# Patient Record
Sex: Male | Born: 1965 | Race: White | Hispanic: No | Marital: Married | Smoking: Never smoker
Health system: Southern US, Community
[De-identification: ages and names within clinical notes are randomized; demographics above are authoritative.]

## PROBLEM LIST (undated history)

## (undated) DIAGNOSIS — I10 Essential (primary) hypertension: Secondary | ICD-10-CM

## (undated) DIAGNOSIS — G709 Myoneural disorder, unspecified: Secondary | ICD-10-CM

## (undated) DIAGNOSIS — M1990 Unspecified osteoarthritis, unspecified site: Secondary | ICD-10-CM

## (undated) DIAGNOSIS — R51 Headache: Secondary | ICD-10-CM

## (undated) DIAGNOSIS — F32A Depression, unspecified: Secondary | ICD-10-CM

## (undated) DIAGNOSIS — M51369 Other intervertebral disc degeneration, lumbar region without mention of lumbar back pain or lower extremity pain: Secondary | ICD-10-CM

## (undated) DIAGNOSIS — F419 Anxiety disorder, unspecified: Secondary | ICD-10-CM

## (undated) DIAGNOSIS — R519 Headache, unspecified: Secondary | ICD-10-CM

## (undated) DIAGNOSIS — K219 Gastro-esophageal reflux disease without esophagitis: Secondary | ICD-10-CM

## (undated) DIAGNOSIS — M542 Cervicalgia: Secondary | ICD-10-CM

## (undated) DIAGNOSIS — G5600 Carpal tunnel syndrome, unspecified upper limb: Secondary | ICD-10-CM

## (undated) DIAGNOSIS — I8390 Asymptomatic varicose veins of unspecified lower extremity: Secondary | ICD-10-CM

## (undated) DIAGNOSIS — M5136 Other intervertebral disc degeneration, lumbar region: Secondary | ICD-10-CM

## (undated) HISTORY — DX: Headache, unspecified: R51.9

## (undated) HISTORY — DX: Carpal tunnel syndrome, unspecified upper limb: G56.00

## (undated) HISTORY — PX: CLAVICLE SURGERY: SHX598

## (undated) HISTORY — DX: Anxiety disorder, unspecified: F41.9

## (undated) HISTORY — DX: Myoneural disorder, unspecified: G70.9

## (undated) HISTORY — DX: Essential (primary) hypertension: I10

## (undated) HISTORY — DX: Headache: R51

## (undated) HISTORY — DX: Asymptomatic varicose veins of unspecified lower extremity: I83.90

---

## 1990-02-17 HISTORY — PX: SHOULDER SURGERY: SHX246

## 2004-02-18 HISTORY — PX: BACK SURGERY: SHX140

## 2008-07-10 ENCOUNTER — Emergency Department: Payer: Self-pay | Admitting: Emergency Medicine

## 2008-10-25 ENCOUNTER — Emergency Department: Payer: Self-pay | Admitting: Emergency Medicine

## 2008-12-06 ENCOUNTER — Ambulatory Visit: Payer: Self-pay | Admitting: Pain Medicine

## 2008-12-28 ENCOUNTER — Ambulatory Visit: Payer: Self-pay | Admitting: Pain Medicine

## 2009-01-03 ENCOUNTER — Ambulatory Visit: Payer: Self-pay | Admitting: Pain Medicine

## 2009-01-16 ENCOUNTER — Ambulatory Visit: Payer: Self-pay | Admitting: Unknown Physician Specialty

## 2009-01-18 ENCOUNTER — Ambulatory Visit: Payer: Self-pay | Admitting: Unknown Physician Specialty

## 2009-02-01 ENCOUNTER — Ambulatory Visit: Payer: Self-pay | Admitting: Pain Medicine

## 2009-03-06 ENCOUNTER — Ambulatory Visit: Payer: Self-pay | Admitting: Pain Medicine

## 2009-03-12 ENCOUNTER — Ambulatory Visit: Payer: Self-pay | Admitting: Pain Medicine

## 2009-03-28 ENCOUNTER — Ambulatory Visit: Payer: Self-pay | Admitting: Unknown Physician Specialty

## 2009-04-12 ENCOUNTER — Ambulatory Visit: Payer: Self-pay | Admitting: Pain Medicine

## 2009-04-23 ENCOUNTER — Ambulatory Visit: Payer: Self-pay | Admitting: Pain Medicine

## 2009-05-30 ENCOUNTER — Emergency Department: Payer: Self-pay | Admitting: Emergency Medicine

## 2009-06-07 ENCOUNTER — Ambulatory Visit: Payer: Self-pay | Admitting: Pain Medicine

## 2009-06-28 ENCOUNTER — Ambulatory Visit: Payer: Self-pay | Admitting: Unknown Physician Specialty

## 2009-08-21 ENCOUNTER — Ambulatory Visit: Payer: Self-pay | Admitting: Pain Medicine

## 2009-08-29 ENCOUNTER — Ambulatory Visit: Payer: Self-pay | Admitting: Pain Medicine

## 2009-10-03 ENCOUNTER — Ambulatory Visit: Payer: Self-pay | Admitting: Pain Medicine

## 2009-10-12 ENCOUNTER — Emergency Department: Payer: Self-pay | Admitting: Emergency Medicine

## 2009-10-29 ENCOUNTER — Emergency Department: Payer: Self-pay | Admitting: Emergency Medicine

## 2009-11-06 ENCOUNTER — Ambulatory Visit: Payer: Self-pay | Admitting: Pain Medicine

## 2009-11-12 ENCOUNTER — Encounter: Admission: RE | Admit: 2009-11-12 | Discharge: 2009-11-12 | Payer: Self-pay | Admitting: Neurosurgery

## 2010-01-01 ENCOUNTER — Ambulatory Visit: Payer: Self-pay | Admitting: Pain Medicine

## 2010-01-03 ENCOUNTER — Emergency Department: Payer: Self-pay | Admitting: Emergency Medicine

## 2010-01-07 ENCOUNTER — Ambulatory Visit: Payer: Self-pay | Admitting: Pain Medicine

## 2010-01-16 ENCOUNTER — Ambulatory Visit: Payer: Self-pay | Admitting: Pain Medicine

## 2010-02-05 ENCOUNTER — Ambulatory Visit: Payer: Self-pay | Admitting: Pain Medicine

## 2010-02-13 ENCOUNTER — Ambulatory Visit: Payer: Self-pay | Admitting: Pain Medicine

## 2010-02-28 ENCOUNTER — Encounter
Admission: RE | Admit: 2010-02-28 | Discharge: 2010-02-28 | Payer: Self-pay | Source: Home / Self Care | Attending: Orthopedic Surgery | Admitting: Orthopedic Surgery

## 2010-03-07 ENCOUNTER — Ambulatory Visit: Payer: Self-pay | Admitting: Pain Medicine

## 2010-03-13 ENCOUNTER — Ambulatory Visit: Payer: Self-pay | Admitting: Pain Medicine

## 2010-04-04 ENCOUNTER — Ambulatory Visit: Payer: Self-pay | Admitting: Pain Medicine

## 2010-04-10 ENCOUNTER — Ambulatory Visit: Payer: Self-pay | Admitting: Pain Medicine

## 2010-04-20 ENCOUNTER — Ambulatory Visit: Payer: Self-pay | Admitting: Emergency Medicine

## 2010-05-02 ENCOUNTER — Ambulatory Visit: Payer: Self-pay | Admitting: Pain Medicine

## 2010-06-10 ENCOUNTER — Other Ambulatory Visit: Payer: Self-pay | Admitting: Orthopedic Surgery

## 2010-06-10 DIAGNOSIS — M542 Cervicalgia: Secondary | ICD-10-CM

## 2010-06-10 DIAGNOSIS — R200 Anesthesia of skin: Secondary | ICD-10-CM

## 2010-06-11 ENCOUNTER — Ambulatory Visit
Admission: RE | Admit: 2010-06-11 | Discharge: 2010-06-11 | Disposition: A | Discharge status: Home / Self Care | Payer: Medicaid Other | Source: Ambulatory Visit | Attending: Orthopedic Surgery | Admitting: Orthopedic Surgery

## 2010-06-11 DIAGNOSIS — R200 Anesthesia of skin: Secondary | ICD-10-CM

## 2010-06-11 DIAGNOSIS — M542 Cervicalgia: Secondary | ICD-10-CM

## 2010-10-17 ENCOUNTER — Ambulatory Visit: Payer: Self-pay | Admitting: Family Medicine

## 2010-10-17 ENCOUNTER — Ambulatory Visit: Payer: Self-pay | Admitting: Pain Medicine

## 2010-10-23 ENCOUNTER — Ambulatory Visit: Payer: Self-pay | Admitting: Pain Medicine

## 2010-11-19 ENCOUNTER — Ambulatory Visit: Payer: Self-pay | Admitting: Pain Medicine

## 2010-11-25 ENCOUNTER — Ambulatory Visit: Payer: Self-pay | Admitting: Pain Medicine

## 2010-12-02 ENCOUNTER — Ambulatory Visit: Payer: Self-pay | Admitting: Pain Medicine

## 2010-12-16 ENCOUNTER — Ambulatory Visit: Payer: Self-pay | Admitting: Pain Medicine

## 2010-12-31 ENCOUNTER — Ambulatory Visit: Payer: Self-pay | Admitting: Pain Medicine

## 2011-01-06 ENCOUNTER — Ambulatory Visit: Payer: Self-pay | Admitting: Pain Medicine

## 2011-01-30 ENCOUNTER — Ambulatory Visit: Payer: Self-pay | Admitting: Pain Medicine

## 2011-02-25 ENCOUNTER — Ambulatory Visit: Payer: Self-pay | Admitting: Pain Medicine

## 2011-04-02 ENCOUNTER — Ambulatory Visit: Payer: Self-pay | Admitting: Pain Medicine

## 2011-04-29 ENCOUNTER — Ambulatory Visit: Payer: Self-pay | Admitting: Pain Medicine

## 2011-05-28 ENCOUNTER — Ambulatory Visit: Payer: Self-pay | Admitting: Pain Medicine

## 2011-06-26 ENCOUNTER — Ambulatory Visit: Payer: Self-pay | Admitting: Pain Medicine

## 2011-07-29 ENCOUNTER — Ambulatory Visit: Payer: Self-pay | Admitting: Pain Medicine

## 2011-08-28 ENCOUNTER — Ambulatory Visit: Payer: Self-pay | Admitting: Pain Medicine

## 2011-09-25 ENCOUNTER — Ambulatory Visit: Payer: Self-pay | Admitting: Pain Medicine

## 2011-10-23 ENCOUNTER — Ambulatory Visit: Payer: Self-pay | Admitting: Pain Medicine

## 2011-11-25 ENCOUNTER — Ambulatory Visit: Payer: Self-pay | Admitting: Pain Medicine

## 2011-12-25 ENCOUNTER — Ambulatory Visit: Payer: Self-pay | Admitting: Pain Medicine

## 2012-01-22 ENCOUNTER — Ambulatory Visit: Payer: Self-pay | Admitting: Pain Medicine

## 2012-02-17 ENCOUNTER — Ambulatory Visit: Payer: Self-pay | Admitting: Pain Medicine

## 2012-03-23 ENCOUNTER — Ambulatory Visit: Payer: Self-pay | Admitting: Pain Medicine

## 2012-04-22 ENCOUNTER — Ambulatory Visit: Payer: Self-pay | Admitting: Pain Medicine

## 2012-05-18 ENCOUNTER — Ambulatory Visit: Payer: Self-pay | Admitting: Pain Medicine

## 2012-06-22 ENCOUNTER — Ambulatory Visit: Payer: Self-pay | Admitting: Pain Medicine

## 2012-07-22 ENCOUNTER — Ambulatory Visit: Payer: Self-pay | Admitting: Pain Medicine

## 2012-08-19 ENCOUNTER — Ambulatory Visit: Payer: Self-pay | Admitting: Pain Medicine

## 2012-09-14 ENCOUNTER — Ambulatory Visit: Payer: Self-pay | Admitting: Pain Medicine

## 2012-10-19 ENCOUNTER — Ambulatory Visit: Payer: Self-pay | Admitting: Pain Medicine

## 2012-11-11 ENCOUNTER — Ambulatory Visit: Payer: Self-pay | Admitting: Pain Medicine

## 2012-12-16 ENCOUNTER — Ambulatory Visit: Payer: Self-pay | Admitting: Pain Medicine

## 2013-01-17 ENCOUNTER — Ambulatory Visit: Payer: Self-pay | Admitting: Pain Medicine

## 2013-02-15 ENCOUNTER — Ambulatory Visit: Payer: Self-pay | Admitting: Pain Medicine

## 2013-03-17 ENCOUNTER — Ambulatory Visit: Payer: Self-pay | Admitting: Pain Medicine

## 2013-03-26 ENCOUNTER — Emergency Department: Payer: Self-pay | Admitting: Emergency Medicine

## 2013-04-12 ENCOUNTER — Ambulatory Visit: Payer: Self-pay | Admitting: Pain Medicine

## 2013-05-12 ENCOUNTER — Ambulatory Visit: Payer: Self-pay | Admitting: Pain Medicine

## 2013-06-15 ENCOUNTER — Ambulatory Visit: Payer: Self-pay | Admitting: Pain Medicine

## 2013-07-13 ENCOUNTER — Ambulatory Visit: Payer: Self-pay | Admitting: Pain Medicine

## 2013-08-11 ENCOUNTER — Ambulatory Visit: Payer: Self-pay | Admitting: Pain Medicine

## 2013-09-13 ENCOUNTER — Ambulatory Visit: Payer: Self-pay | Admitting: Pain Medicine

## 2013-10-12 ENCOUNTER — Ambulatory Visit: Payer: Self-pay | Admitting: Pain Medicine

## 2013-11-09 ENCOUNTER — Ambulatory Visit: Payer: Self-pay | Admitting: Pain Medicine

## 2013-12-08 ENCOUNTER — Ambulatory Visit: Payer: Self-pay | Admitting: Pain Medicine

## 2014-01-05 ENCOUNTER — Ambulatory Visit: Payer: Self-pay | Admitting: Pain Medicine

## 2014-02-02 ENCOUNTER — Ambulatory Visit: Payer: Self-pay | Admitting: Pain Medicine

## 2014-02-17 DIAGNOSIS — M47812 Spondylosis without myelopathy or radiculopathy, cervical region: Secondary | ICD-10-CM

## 2014-02-17 HISTORY — DX: Spondylosis without myelopathy or radiculopathy, cervical region: M47.812

## 2014-03-07 ENCOUNTER — Ambulatory Visit: Payer: Self-pay | Admitting: Pain Medicine

## 2014-04-06 ENCOUNTER — Ambulatory Visit: Payer: Self-pay | Admitting: Pain Medicine

## 2014-05-03 ENCOUNTER — Ambulatory Visit: Payer: Self-pay | Admitting: Pain Medicine

## 2014-06-05 ENCOUNTER — Ambulatory Visit
Admit: 2014-06-05 | Disposition: A | Discharge status: Home / Self Care | Payer: Self-pay | Attending: Pain Medicine | Admitting: Pain Medicine

## 2014-07-04 ENCOUNTER — Encounter (INDEPENDENT_AMBULATORY_CARE_PROVIDER_SITE_OTHER): Payer: Self-pay

## 2014-07-04 ENCOUNTER — Encounter: Payer: Self-pay | Admitting: Pain Medicine

## 2014-07-04 ENCOUNTER — Ambulatory Visit: Payer: Self-pay | Attending: Pain Medicine | Admitting: Pain Medicine

## 2014-07-04 VITALS — BP 135/94 | HR 74 | Temp 98.1°F | Resp 16 | Ht 73.0 in | Wt 212.0 lb

## 2014-07-04 DIAGNOSIS — M19041 Primary osteoarthritis, right hand: Secondary | ICD-10-CM | POA: Insufficient documentation

## 2014-07-04 DIAGNOSIS — M19042 Primary osteoarthritis, left hand: Secondary | ICD-10-CM | POA: Insufficient documentation

## 2014-07-04 DIAGNOSIS — M47816 Spondylosis without myelopathy or radiculopathy, lumbar region: Secondary | ICD-10-CM | POA: Insufficient documentation

## 2014-07-04 DIAGNOSIS — G5603 Carpal tunnel syndrome, bilateral upper limbs: Secondary | ICD-10-CM

## 2014-07-04 DIAGNOSIS — M47812 Spondylosis without myelopathy or radiculopathy, cervical region: Secondary | ICD-10-CM | POA: Insufficient documentation

## 2014-07-04 DIAGNOSIS — M5136 Other intervertebral disc degeneration, lumbar region: Secondary | ICD-10-CM | POA: Insufficient documentation

## 2014-07-04 DIAGNOSIS — M5030 Other cervical disc degeneration, unspecified cervical region: Secondary | ICD-10-CM

## 2014-07-04 DIAGNOSIS — M19012 Primary osteoarthritis, left shoulder: Secondary | ICD-10-CM | POA: Insufficient documentation

## 2014-07-04 DIAGNOSIS — M19011 Primary osteoarthritis, right shoulder: Secondary | ICD-10-CM | POA: Insufficient documentation

## 2014-07-04 DIAGNOSIS — M170 Bilateral primary osteoarthritis of knee: Secondary | ICD-10-CM | POA: Insufficient documentation

## 2014-07-04 MED ORDER — OXYCODONE HCL 10 MG PO TABS: ORAL_TABLET | ORAL | Status: DC

## 2014-07-04 MED ORDER — DICLOFENAC SODIUM 50 MG PO TBEC: DELAYED_RELEASE_TABLET | ORAL | Status: DC

## 2014-07-04 NOTE — Progress Notes (Signed)
Discharge patient home, ambulatory at 0851 hrs Teach back 3 done Script given oxycodone

## 2014-07-04 NOTE — Progress Notes (Signed)
   Subjective:    Patient ID: Gary Schultz, male    DOB: 07/31/1965, 49 y.o.   MRN: [id number]  HPI  Patient is a 49 year old gentleman returns to pain management for further evaluation and treatment of pain involving the neck and entire back upper and lower extremities. Pain is aggravated by standing walking twisting turning maneuvers. Patient with significant pain involving the hands especially the thumb of the right hand. We discussed patient's condition and patient is considering proceeding with evaluation of his hand by surgeon as we have previously discussed. We will continue medications as prescribed at this time and avoid interventional treatment. I'll understanding and in agreement with suggested treatment plan.  Review of Systems     Objective:   Physical Exam  Physical examination revealed tenderness to palpation of the splenius capitis muscles. There was tenderness over the cervical facet cervical paraspinal muscles as well as tenderness over the thoracic facet thoracic paraspinal musculature region with tenderness of the acromioclavicular and glenohumeral joint regions as well. Palpation of the thoracic region was noted tenderness to palpation without crepitus of the thoracic region noted with significant muscle spasm haven't been noted. There is decreased grip strength on the right compared to the left with severe tenderness to palpation the base of the right thumb without increased warmth crepitus are erythema noted. Palpation over the lumbar paraspinal musculature region lumbar facet region was tenderness to palpation with lateral bending and rotation reproducing pain of moderate degree with tenderness of the PSIS region and PIIS region straight leg raising was tolerates approximately 20 without definite increased pain with dorsiflexion and EHL strength appeared to be decreased. No definite sensory deficit of dermatomal distribution detected. Abdomen nontender no costovertebral  angle tenderness noted.    Assessment & Plan:  Assessment  Degenerative disc disease lumbar spine L2-3, L3-4, L4-L5, and L5-S1 degenerative changes with prior surgical intervention of the lumbar region.   Degenerative disc disease cervical spine For 5, C5-6 degenerative changes predominantly  Degenerative joint disease Hands, shoulders, knees    Plan  Continue present medications. Voltaren oxycodone  F/U PCP for evaliation of  BP and general medical  condition.  F/U surgical evaluation. Patient is considering undergoing orthopedic evaluation of hands  F/U neurological evaluation.  May consider radiofrequency rhizolysis or intraspinal procedures pending response to present treatment and F/U evaluation.  Patient to call [facility] Management Center should patient have concerns prior to scheduled return appointment.   Degenerative changes

## 2014-07-04 NOTE — Progress Notes (Signed)
   Subjective:    Patient ID: Gary Schultz, male    DOB: 09/17/1965, 49 y.o.   MRN: [id number]  HPI    Review of Systems     Objective:   Physical Exam        Assessment & Plan:

## 2014-07-04 NOTE — Patient Instructions (Signed)
Continue present medications. Voltaren and oxycodone.  F/U PCP for evaliation of  BP and general medical  condition.  F/U surgical evaluation.. Surgical evaluation of hand to be considered as discussed  F/U nrurological evaluation.  May consider radiofrequency rhizolysis or intraspinal procedures pending response to present treatment and F/U evaluation.  Patient to call [facility] Management Center should patient have concerns prior to scheduled return appointment.

## 2014-08-02 ENCOUNTER — Encounter: Payer: Self-pay | Admitting: Pain Medicine

## 2014-08-02 ENCOUNTER — Ambulatory Visit: Payer: Self-pay | Attending: Pain Medicine | Admitting: Pain Medicine

## 2014-08-02 VITALS — BP 118/83 | HR 68 | Temp 97.8°F | Resp 16 | Ht 73.0 in | Wt 208.0 lb

## 2014-08-02 DIAGNOSIS — G5600 Carpal tunnel syndrome, unspecified upper limb: Secondary | ICD-10-CM | POA: Insufficient documentation

## 2014-08-02 DIAGNOSIS — M5481 Occipital neuralgia: Secondary | ICD-10-CM | POA: Insufficient documentation

## 2014-08-02 DIAGNOSIS — M47816 Spondylosis without myelopathy or radiculopathy, lumbar region: Secondary | ICD-10-CM | POA: Insufficient documentation

## 2014-08-02 DIAGNOSIS — M47812 Spondylosis without myelopathy or radiculopathy, cervical region: Secondary | ICD-10-CM | POA: Insufficient documentation

## 2014-08-02 DIAGNOSIS — Z9889 Other specified postprocedural states: Secondary | ICD-10-CM | POA: Insufficient documentation

## 2014-08-02 DIAGNOSIS — M5030 Other cervical disc degeneration, unspecified cervical region: Secondary | ICD-10-CM | POA: Insufficient documentation

## 2014-08-02 DIAGNOSIS — M533 Sacrococcygeal disorders, not elsewhere classified: Secondary | ICD-10-CM | POA: Insufficient documentation

## 2014-08-02 DIAGNOSIS — M5136 Other intervertebral disc degeneration, lumbar region: Secondary | ICD-10-CM | POA: Insufficient documentation

## 2014-08-02 DIAGNOSIS — R51 Headache: Secondary | ICD-10-CM | POA: Insufficient documentation

## 2014-08-02 MED ORDER — OXYCODONE HCL 10 MG PO TABS: ORAL_TABLET | ORAL | Status: DC

## 2014-08-02 MED ORDER — DICLOFENAC SODIUM 50 MG PO TBEC: DELAYED_RELEASE_TABLET | ORAL | Status: DC

## 2014-08-02 NOTE — Progress Notes (Signed)
Safety precautions to be maintained throughout the outpatient stay will include: orient to surroundings, keep bed in low position, maintain call bell within reach at all times, provide assistance with transfer out of bed and ambulation.  Discharged via ambulatory at 8:15 am.

## 2014-08-02 NOTE — Patient Instructions (Addendum)
Continue present medications. Voltaren and oxycodone  F/U PCP for evaliation of  BP and general medical  condition.  F/U surgical evaluation.. As discussed suggested that you have your carpal tunnel evaluated and your arthritic knee  F/U neurological evaluation.  May consider radiofrequency rhizolysis or intraspinal procedures pending response to present treatment and F/U evaluation.  Patient to call [facility] Management Center should patient have concerns prior to scheduled return appointment.   A prescription for VOLTARIN was sent to your pharmacy and should be available for pickup today. A prescription for OXYCODONE was given to you today.

## 2014-08-02 NOTE — Progress Notes (Signed)
   Subjective:    Patient ID: Gary Schultz, male    DOB: 1965/02/23, 49 y.o.   MRN: [id number]  HPI  Patient is 49 year old gentleman returns to [facility] for further evaluation and treatment of pain involving the neck upper extremity regions especially the right upper extremity with severe carpal tunnel syndrome pain. Patient is with pain involving the mid and lower back regions as well aggravated by standing and walking. Patient states that his occupation aggravates his condition severely. 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 We have discussed patient's condition and due to insurance purposes patient is unable to undergo interventional treatment. We will continue present medications as prescribed and patient will consider further treatment pending follow-up evaluation. We will also recommend orthopedic evaluation which patient prefers to avoid due to expenses as well. We advised patient follow-up with primary care physician regarding elevated blood pressure and will continue medications as prescribed.       Review of Systems     Objective:   Physical Exam  There was tends O screens And occipitalis musculature regions palpation was reproduced mild to moderate discomfort. There appear to be unremarkable Spurling's maneuver. Palpation over the cervical facet and thoracic facet paraspinal musculature region were sent to palpation of moderate degree. Patient was with severely decreased grip strength on the right compared to the left with severe increase of pain with Tinel's and Phalen's maneuver. There was tenderness to palpation over the lumbar paraspinal musculature region lumbar facet region palpation of which reproduced pain of moderate to moderately severe degree. Lateral bending rotation extension and palpation of the lumbar facets reproduced severe pain. Straight leg  raising was tolerates approximately 20 without increase of pain with dorsiflexion noted. EHL strength appeared to be decreased with no definite sensory deficit of dermatomal distribution detected. There was negative clonus negative Homans. Mild tenderness of greater trochanteric region iliotibial band region was noted.  Abdomen nontender no costovertebral tenderness noted.    Assessment & Plan:  Assessment degenerative disc disease lumbar spine  L2-3, L3-4, L4-L5, and L5-S1 degenerative changes predominantly Status post lumbar surgery  Lumbar facet syndrome  Sacroiliac joint dysfunction  Degenerative disc disease cervical spine C4-5, C5-6 degenerative changes noted predominantly  Cervicogenic headaches  Bilateral greater occipital neuralgia  Carpal tunnel syndrome    Plan  Continue present medications. Oxycodone and Voltaren  F/U PCP for evaliation of  BP and general medical  condition. Patient follow-up with primary care physician for evaluation of blood pressure as discussed  F/U surgical evaluation. Surgical evaluation of carpal tunnel syndrome as discussed. Patient prefers to avoid due to expense and lack of insurance at this time  Neurosurgical evaluation of pain of the cervical and upper extremity regions and lumbar and lower extremity regions. We have recommended patient undergo neurosurgical evaluation which patient prefers to avoid due to lack of insurance and expense of the evaluation at this time  F/U neurological evaluation.  May consider radiofrequency rhizolysis or intraspinal procedures pending response to present treatment and F/U evaluation.  Patient to call [facility] Management Center should patient have concerns prior to scheduled return appointment.

## 2014-08-31 ENCOUNTER — Ambulatory Visit: Payer: Self-pay | Attending: Pain Medicine | Admitting: Pain Medicine

## 2014-08-31 ENCOUNTER — Encounter: Payer: Self-pay | Admitting: Pain Medicine

## 2014-08-31 VITALS — BP 150/87 | HR 74 | Temp 96.8°F | Resp 18 | Ht 73.0 in | Wt 210.0 lb

## 2014-08-31 DIAGNOSIS — M5136 Other intervertebral disc degeneration, lumbar region: Secondary | ICD-10-CM | POA: Insufficient documentation

## 2014-08-31 DIAGNOSIS — M47816 Spondylosis without myelopathy or radiculopathy, lumbar region: Secondary | ICD-10-CM | POA: Insufficient documentation

## 2014-08-31 DIAGNOSIS — G5600 Carpal tunnel syndrome, unspecified upper limb: Secondary | ICD-10-CM | POA: Insufficient documentation

## 2014-08-31 DIAGNOSIS — M47812 Spondylosis without myelopathy or radiculopathy, cervical region: Secondary | ICD-10-CM | POA: Insufficient documentation

## 2014-08-31 DIAGNOSIS — Z9889 Other specified postprocedural states: Secondary | ICD-10-CM | POA: Insufficient documentation

## 2014-08-31 MED ORDER — OXYCODONE HCL 10 MG PO TABS
ORAL_TABLET | ORAL | Status: DC
Start: 2014-08-31 — End: 2014-10-03

## 2014-08-31 MED ORDER — DICLOFENAC SODIUM 50 MG PO TBEC: DELAYED_RELEASE_TABLET | ORAL | Status: DC

## 2014-08-31 NOTE — Progress Notes (Signed)
Safety precautions to be maintained throughout the outpatient stay will include: orient to surroundings, keep bed in low position, maintain call bell within reach at all times, provide assistance with transfer out of bed and ambulation.  

## 2014-08-31 NOTE — Patient Instructions (Signed)
Continue present medications Voltaren and oxycodone  F/U PCP for evaliation of  BP and general medical  condition.  F/U surgical evaluation  F/U neurological evaluation  May consider radiofrequency rhizolysis or intraspinal procedures pending response to present treatment and F/U evaluation.  Patient to call [facility] Center should patient have concerns prior to scheduled return appointment.

## 2014-08-31 NOTE — Progress Notes (Signed)
   Subjective:    Patient ID: Gary Schultz, male    DOB: 03/27/1965, 49 y.o.   MRN: [id number]  HPI Patient is 49 year old gentleman returns a [facility] for further evaluation and treatment of pain involving the region of the neck upper extremity regions upper mid and lower back and lower extremity regions. Patient states that he has significant pain involving the region of the hands. 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 We discussed surgical evaluation of patient's condition. We will avoid interventional treatment as discussed with patient. Patient without trauma change in events of daily living the call significant change in symptomatology. We we will continue presently prescribed medications Remain available to modified treatment regimen as discussed. The patient was understanding and agree with suggested treatment plan.     Review of Systems     Objective:   Physical Exam  There was mild to moderate tenderness of the splenius capitis and occipitalis region. Palpation over the cervical facet cervical paraspinal musculature region reproduced moderate discomfort as well. There appeared to be unremarkable Spurling's maneuver. Patient was with decreased grip strength especially on the right. Tinel and Phalen's maneuver increased pain of moderate to moderately severe degree on the right. An moderate degree on the left. There was no definite sensory deficit of dermatomal distribution detected. Palpation over the thoracic facet thoracic paraspinal musculature region was a tenderness to palpation with tenderness over the upper mid and lower thoracic paraspinal musculature regions. Patient over the lumbar paraspinal muscles lumbar facet region was with tinged palpation of moderate degree. Extension and palpation of the lumbar facets reproduce moderate discomfort. There was tenderness over the PSIS and PII S region of moderate degree. Straight  leg raising limited proximal 20 without increased pain with dorsiflexion noted. There was negative clonus negative Homans. Abdomen nontender with no costovertebral angle tenderness noted.    Assessment & Plan:  Degenerative disc disease lumbar spine Multilevel lumbar degenerative changes Status post lumbar surgery  Lumbar facet syndrome  Degenerative changes cervical spine C5 and C5-6 degenerative changes especially  Cervical facet syndrome  Carpal tunnel syndrome    Plan   Continue present medications Voltaren and oxycodone  F/U PCP A Krebs  for evaliation of  BP and general medical  condition.  F/U surgical evaluation. We discussed patient undergoing surgical evaluation of his upper extremity pain with there being concern regarding significant carpal tunnel syndrome. Patient will consider surgical evaluation as discussed. We also discussed patient undergoing neurosurgical evaluation of pain of the cervical region and upper extremity regions  F/U neurological evaluation.  May consider radiofrequency rhizolysis or intraspinal procedures pending response to present treatment and F/U evaluation.  Patient to call [facility] Center should patient have concerns prior to scheduled return appointment.

## 2014-09-14 ENCOUNTER — Other Ambulatory Visit: Payer: Self-pay | Admitting: Pain Medicine

## 2014-10-03 ENCOUNTER — Encounter: Payer: Self-pay | Admitting: Pain Medicine

## 2014-10-03 ENCOUNTER — Ambulatory Visit: Payer: Self-pay | Attending: Pain Medicine | Admitting: Pain Medicine

## 2014-10-03 VITALS — BP 141/82 | HR 72 | Temp 96.5°F | Resp 18 | Ht 73.0 in | Wt 210.0 lb

## 2014-10-03 DIAGNOSIS — M47816 Spondylosis without myelopathy or radiculopathy, lumbar region: Secondary | ICD-10-CM | POA: Insufficient documentation

## 2014-10-03 DIAGNOSIS — M5030 Other cervical disc degeneration, unspecified cervical region: Secondary | ICD-10-CM | POA: Insufficient documentation

## 2014-10-03 DIAGNOSIS — M47812 Spondylosis without myelopathy or radiculopathy, cervical region: Secondary | ICD-10-CM | POA: Insufficient documentation

## 2014-10-03 DIAGNOSIS — M5136 Other intervertebral disc degeneration, lumbar region: Secondary | ICD-10-CM | POA: Insufficient documentation

## 2014-10-03 DIAGNOSIS — Z9889 Other specified postprocedural states: Secondary | ICD-10-CM | POA: Insufficient documentation

## 2014-10-03 DIAGNOSIS — G5600 Carpal tunnel syndrome, unspecified upper limb: Secondary | ICD-10-CM | POA: Insufficient documentation

## 2014-10-03 MED ORDER — DICLOFENAC SODIUM 50 MG PO TBEC: DELAYED_RELEASE_TABLET | ORAL | Status: DC

## 2014-10-03 MED ORDER — OXYCODONE HCL 10 MG PO TABS: ORAL_TABLET | ORAL | Status: DC

## 2014-10-03 NOTE — Progress Notes (Signed)
   Subjective:    Patient ID: Gary Schultz, male    DOB: 19-May-1965, 49 y.o.   MRN: [id number]  HPI  Patient is 49 year old gentleman returns to [facility] for further evaluation and treatment of pain involving the neck and entire back upper and lower extremity regions. Patient tolerating medications well at this time. We discussed additional treatment including interventional treatment. At the present time patient is waiting for insurance coverage prior to having procedures. We will consider patient for modification of medications pending follow-up evaluation as discussed. 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 There appeared to be unremarkable Spurling's maneuver. Patient was with decreased grip strength. Tinel and Phalen's maneuver were associated with increased pain. There was tenderness over the cervical facet cervical paraspinal musculature region as well as the thoracic facet thoracic paraspinal musculatures in a moderate degree no crepitus of the thoracic region was noted. There was tenderness of the acromioclavicular and glenohumeral joint region of moderate degree as well of the lumbar paraspinal musculature region lumbar facet region associated with moderate moderately severe discomfort. Lateral bending and rotation and extension to palpation of the lumbar facets reproduce moderate to moderately severe discomfort. Straight leg raising was tolerated to approximately 30 without a definite increase of pain with dorsiflexion noted. There was negative  clonus negative Homans there was mild tinnitus of the greater trochanteric region and iliotibial band region. Abdomen was nontender and no costovertebral tenderness was noted.      Assessment & Plan:    Degenerative disc disease lumbar spine Multilevel lumbar degenerative changes Status post lumbar surgery  Lumbar facet syndrome  Degenerative changes cervical spine C5 and C5-6 degenerative changes especially  Cervical facet syndrome  Carpal tunnel syndrome    Plan  Continue present medications Voltaren and oxycodone  F/U PCP Dr. Laroy Apple for evaliation of  BP and general medical  condition and to discuss rheumatological evaluation  F/U surgical evaluation as discussed  F/U neurological evaluation  May consider radiofrequency rhizolysis or intraspinal procedures pending response to present treatment and F/U evaluation   Patient to call [facility] should patient have concerns prior to scheduled return appointmen.

## 2014-10-03 NOTE — Patient Instructions (Addendum)
Continue present medications Voltaren and oxycodone   F/U PCP Dr.Krebs  for evaliation of  BP and general medical  condition and to discuss rheumatological evaluation  F/U surgical evaluation as discussed  F/U neurological evaluation  May consider radiofrequency rhizolysis or intraspinal procedures pending response to present treatment and F/U evaluation   Patient to call [facility] Center should patient have concerns prior to scheduled return appointmen.

## 2014-10-03 NOTE — Progress Notes (Signed)
Safety precautions to be maintained throughout the outpatient stay will include: orient to surroundings, keep bed in low position, maintain call bell within reach at all times, provide assistance with transfer out of bed and ambulation.  

## 2014-11-02 ENCOUNTER — Ambulatory Visit: Payer: Self-pay | Attending: Pain Medicine | Admitting: Pain Medicine

## 2014-11-02 ENCOUNTER — Encounter: Payer: Self-pay | Admitting: Pain Medicine

## 2014-11-02 VITALS — BP 136/93 | HR 65 | Temp 98.0°F | Resp 18 | Ht 73.0 in | Wt 210.0 lb

## 2014-11-02 DIAGNOSIS — M5030 Other cervical disc degeneration, unspecified cervical region: Secondary | ICD-10-CM

## 2014-11-02 DIAGNOSIS — M47812 Spondylosis without myelopathy or radiculopathy, cervical region: Secondary | ICD-10-CM | POA: Insufficient documentation

## 2014-11-02 DIAGNOSIS — Z9889 Other specified postprocedural states: Secondary | ICD-10-CM | POA: Insufficient documentation

## 2014-11-02 DIAGNOSIS — M5136 Other intervertebral disc degeneration, lumbar region: Secondary | ICD-10-CM | POA: Insufficient documentation

## 2014-11-02 DIAGNOSIS — G5600 Carpal tunnel syndrome, unspecified upper limb: Secondary | ICD-10-CM | POA: Insufficient documentation

## 2014-11-02 DIAGNOSIS — G5603 Carpal tunnel syndrome, bilateral upper limbs: Secondary | ICD-10-CM

## 2014-11-02 DIAGNOSIS — M47816 Spondylosis without myelopathy or radiculopathy, lumbar region: Secondary | ICD-10-CM | POA: Insufficient documentation

## 2014-11-02 MED ORDER — OXYCODONE HCL 10 MG PO TABS: ORAL_TABLET | ORAL | Status: DC

## 2014-11-02 MED ORDER — DICLOFENAC SODIUM 50 MG PO TBEC: DELAYED_RELEASE_TABLET | ORAL | Status: DC

## 2014-11-02 NOTE — Progress Notes (Signed)
Discharged to home ambulatory with script in hand for oxycodone.  Return in 1 month.  Voltaren at pharmacy for pick up.

## 2014-11-02 NOTE — Progress Notes (Signed)
Safety precautions to be maintained throughout the outpatient stay will include: orient to surroundings, keep bed in low position, maintain call bell within reach at all times, provide assistance with transfer out of bed and ambulation.  

## 2014-11-02 NOTE — Progress Notes (Signed)
   Subjective:    Patient ID: Gary Schultz, male    DOB: 1965/07/09, 49 y.o.   MRN: [id number]  HPI   Patient 49 year old gentleman returns to pain management for further evaluation and treatment of pain involving the neck upper extremities lower back and lower extremity regions. Patient has significant pain involving the hands which is aggravated by activities on the job. 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 Patient with pain involving the neck as well which also is due to patient's on the job activities. Patient with lower back lower extremity pain in addition to previously mentioned conditions. Discussed patient's condition will continue present medication. Patient will be considered for interventional treatment once patient obtains insurance. At the present time we recommended patient undergo surgical evaluation of pain of the hands cervical upper extremity regions lumbar and lower extremity regions.. We will continue present medications Voltaren and oxycodone as prescribed. The patient agrees with suggested plan.     Review of Systems     Objective:   Physical Exam  There was tenderness of the splenius capitis and occipitalis regions of moderate degree. Patient with limited range of motion of the cervical spine. There was tenderness of the trapezius levator scapula rhomboid musculature regions of moderate degree. Palpation of the cervical paraspinal musculature region and thoracic paraspinal musculature region reproduced pain of moderate degree. No crepitus of the thoracic region was noted. Patient with decreased grip strength. Tinel and Phalen maneuver with moderate increase of pain. There was tends to palpation at the base of thumbs with nodule formation noted as well. There was no increased warmth or erythema noted. Patient was with decreased grip strength. There was tenderness over the lumbar paraspinal musculature and lumbar facet region with lateral bending and rotation and  extension and palpation of the lumbar facets reproducing moderate discomfort. Straight leg raising limited to approximately 30 without definite increased pain with dorsiflexion noted. There appeared to be negative clonus negative Homans. Tenderness and no costovertebral angle tenderness noted.          Assessment & Plan:   Degenerative disc disease lumbar spine Multilevel lumbar degenerative changes Status post lumbar surgery  Lumbar facet syndrome  Degenerative changes cervical spine C5 and C5-6 degenerative changes especially  Cervical facet syndrome  Carpal tunnel syndrome    PLAN   Continue present medication Voltaren and oxycodone  F/U PCP Dr.Krebs  for evaliation of  BP and general medical  condition  F/U surgical evaluation . Patient undergo surgical evaluation of hands cervical lumbar and extremity regions as discussed,   F/U neurological evaluation. May consider pending follow-up evaluations  Continue use of TENS unit and avoid aggravation of symptoms  May consider radiofrequency rhizolysis or intraspinal procedures pending response to present treatment and F/U evaluation   Patient to call [facility] Center should patient have concerns prior to scheduled return appointment.

## 2014-11-02 NOTE — Patient Instructions (Addendum)
PLAN   Continue present medication Voltaren and oxycodone  F/U PCP Dr. Laroy Apple for evaliation of  BP and general medical  condition  F/U surgical evaluation. May consider pending follow-up evaluations May see hand surgeon to discuss surgery as we discussed. Continue to wear wrist splints at night for pain of the upper extremities  Continue to use TENS unit and avoid aggravation of symptoms  F/U neurological evaluation. May consider pending follow-up evaluations  May consider radiofrequency rhizolysis or intraspinal procedures pending response to present treatment and F/U evaluation   Patient to call [facility] Center should patient have concerns prior to scheduled return appointment.

## 2014-11-05 ENCOUNTER — Other Ambulatory Visit: Payer: Self-pay | Admitting: Family Medicine

## 2014-11-15 ENCOUNTER — Other Ambulatory Visit: Payer: Self-pay | Admitting: Family Medicine

## 2014-11-15 NOTE — Telephone Encounter (Signed)
What labs do you need ordered?  Can I call in this Rx or should we wait till OV?

## 2014-11-15 NOTE — Telephone Encounter (Signed)
Pt called need  Refill on  Lisinopril 30 mg  Call in to [facility] in [place] / Pt also need  Lab order for Labs for Thursday. Pt call back  # is  [phone]

## 2014-11-15 NOTE — Telephone Encounter (Signed)
I would hold the Lisinopril refill unless he will be totally out.  It may need to be changed.  Also, insurance likes for all labs to to linked to a dx., and that is done better at office visit.  Results can be discussed after visit.-jh

## 2014-11-20 NOTE — Telephone Encounter (Signed)
R/t call to patient to find out if he had been contacted. Also to let him know what Dr. Juanetta Gosling suggested.

## 2014-11-22 NOTE — Telephone Encounter (Signed)
Left message

## 2014-11-24 NOTE — Telephone Encounter (Signed)
Sending letter can not reach by phone.[facility]

## 2014-11-28 ENCOUNTER — Encounter: Payer: Self-pay | Admitting: Pain Medicine

## 2014-11-28 ENCOUNTER — Ambulatory Visit: Payer: Self-pay | Attending: Pain Medicine | Admitting: Pain Medicine

## 2014-11-28 VITALS — BP 140/95 | HR 67 | Temp 96.2°F | Resp 18 | Ht 73.0 in | Wt 215.0 lb

## 2014-11-28 DIAGNOSIS — Z9889 Other specified postprocedural states: Secondary | ICD-10-CM | POA: Insufficient documentation

## 2014-11-28 DIAGNOSIS — M47816 Spondylosis without myelopathy or radiculopathy, lumbar region: Secondary | ICD-10-CM | POA: Insufficient documentation

## 2014-11-28 DIAGNOSIS — M5136 Other intervertebral disc degeneration, lumbar region: Secondary | ICD-10-CM | POA: Insufficient documentation

## 2014-11-28 DIAGNOSIS — M5030 Other cervical disc degeneration, unspecified cervical region: Secondary | ICD-10-CM

## 2014-11-28 DIAGNOSIS — G5603 Carpal tunnel syndrome, bilateral upper limbs: Secondary | ICD-10-CM

## 2014-11-28 DIAGNOSIS — M47814 Spondylosis without myelopathy or radiculopathy, thoracic region: Secondary | ICD-10-CM | POA: Insufficient documentation

## 2014-11-28 DIAGNOSIS — M47812 Spondylosis without myelopathy or radiculopathy, cervical region: Secondary | ICD-10-CM

## 2014-11-28 DIAGNOSIS — G5600 Carpal tunnel syndrome, unspecified upper limb: Secondary | ICD-10-CM | POA: Insufficient documentation

## 2014-11-28 MED ORDER — OXYCODONE HCL 10 MG PO TABS: ORAL_TABLET | ORAL | Status: DC

## 2014-11-28 NOTE — Progress Notes (Signed)
Safety precautions to be maintained throughout the outpatient stay will include: orient to surroundings, keep bed in low position, maintain call bell within reach at all times, provide assistance with transfer out of bed and ambulation.  

## 2014-11-28 NOTE — Patient Instructions (Addendum)
PLAN   Continue present medication diclofenac and oxycodone  F/U PCP Dr. Laroy Apple for evaliation of  BP and general medical  condition  F/U surgical evaluation as discussed  F/U neurological evaluation. May consider pending follow-up evaluations  May consider radiofrequency rhizolysis or intraspinal procedures pending response to present treatment and F/U evaluation   Patient to call Pain Management Center should patient have concerns prior to scheduled return appointment. Pain Management Discharge Instructions  General Discharge Instructions :  If you need to reach your doctor call: Monday-Friday 8:00 am - 4:00 pm at [phone] or toll free [phone].  After clinic hours [phone] to have operator reach doctor.  Bring all of your medication bottles to all your appointments in the pain clinic.  To cancel or reschedule your appointment with Pain Management please remember to call 24 hours in advance to avoid a fee.  Refer to the educational materials which you have been given on: General Risks, I had my Procedure. Discharge Instructions, Post Sedation.  Post Procedure Instructions:  The drugs you were given will stay in your system until tomorrow, so for the next 24 hours you should not drive, make any legal decisions or drink any alcoholic beverages.  You may eat anything you prefer, but it is better to start with liquids then soups and crackers, and gradually work up to solid foods.  Please notify your doctor immediately if you have any unusual bleeding, trouble breathing or pain that is not related to your normal pain.  Depending on the type of procedure that was done, some parts of your body may feel week and/or numb.  This usually clears up by tonight or the next day.  Walk with the use of an assistive device or accompanied by an adult for the 24 hours.  You may use ice on the affected area for the first 24 hours.  Put ice in a Ziploc bag and cover with a towel and  place against area 15 minutes on 15 minutes off.  You may switch to heat after 24 hours.

## 2014-11-28 NOTE — Progress Notes (Signed)
   Subjective:    Patient ID: Gary Schultz, male    DOB: 06-25-1965, 49 y.o.   MRN: [id number]  HPI Patient 49 year old gentleman [facility] evaluation and treatment of pain involving the neck upper extremity regions and with severe tenderness to palpation base of the thumb of the right hand. Patient states that his carpal tunnel symptoms appeared to be exacerbated at this time as well patient is mechanics and misuses hands for working. Patient states that lower back lower extremity pain increases as the day progresses as well. We will continue presently prescribed medications and remain available we'll proceed with interventional treatment. At the present time patient is attempting to obtain insurance. We will continue nonsegmental treatment medications at this time. The patient denies any trauma change in events of daily living the cause change in symptomatology. We will continue presently prescribed medications. Patient to call pain management should be change in condition prior to scheduled return appointment all understanding and in agreement with suggested treatment plan.   Review of Systems     Objective:   Physical Exam There was tennis of the splenius capitis and occipitalis musculature region of moderate degree with limited range of motion of the cervical spine. There was tenderness of the cervical facet cervical paraspinal musculature region of moderate degree. Palpation over the thoracic facet thoracic paraspinal musculature region was with moderate tends to palpation as well Tinel and Phalen's maneuver associated with moderately severe discomfort. There was tenderness to palpation in the base of the thumb of the right hand without increased warmth or erythema noted. There was decreased grip strength on the right compared to left. Tinel and Phalen's maneuver associated with moderately severe increased pain. Palpation over the thoracic facet thoracic  paraspinal musculature region associated increased pain of moderate degree with no crepitus of the thoracic region noted.  Palpation over the lumbar paraspinal muscle lumbar facet region associated with moderately severe discomfort with lateral bending rotation and extension palpation of the lumbar facets reproducing moderately severe discomfort. Straight leg raising tolerates approximately 20 without increased pain with dorsiflexion noted. DTRs appear to be trace at the knees. There was negative clonus negative Homans. No definite sensory deficit of dermatomal distribution was detected. Lateral bending and rotation and extension palpation of the lumbar facets reproduce moderately severe discomfort. There was negative clonus negative Homans. Abdomen nontender with no costovertebral angle tenderness noted.       Assessment & Plan:    Degenerative disc disease lumbar spine Multilevel lumbar degenerative changes Status post lumbar surgery  Lumbar facet syndrome  Degenerative changes cervical spine C5 and C5-6 degenerative changes especially  Cervical facet syndrome  Carpal tunnel syndrome     PLAN   Continue present medication diclofenac and oxycodone  F/U PCP Dr. Laroy Apple for evaliation of  BP and general medical  condition  F/U surgical evaluation as discussed  F/U neurological evaluation. May consider pending follow-up evaluations  May consider radiofrequency rhizolysis or intraspinal procedures pending response to present treatment and F/U evaluation  At the present time patient is awaiting to obtain insurance Patient to call [facility] should patient have concerns prior to scheduled return appointment.

## 2014-11-30 ENCOUNTER — Encounter: Payer: Self-pay | Admitting: Pain Medicine

## 2014-12-19 ENCOUNTER — Other Ambulatory Visit: Payer: Self-pay | Admitting: Pain Medicine

## 2014-12-28 ENCOUNTER — Ambulatory Visit: Payer: Self-pay | Attending: Pain Medicine | Admitting: Pain Medicine

## 2014-12-28 ENCOUNTER — Encounter: Payer: Self-pay | Admitting: Pain Medicine

## 2014-12-28 VITALS — BP 149/89 | HR 77 | Temp 98.0°F | Resp 18 | Ht 73.0 in | Wt 215.0 lb

## 2014-12-28 DIAGNOSIS — M19041 Primary osteoarthritis, right hand: Secondary | ICD-10-CM | POA: Insufficient documentation

## 2014-12-28 DIAGNOSIS — G5600 Carpal tunnel syndrome, unspecified upper limb: Secondary | ICD-10-CM | POA: Insufficient documentation

## 2014-12-28 DIAGNOSIS — G5603 Carpal tunnel syndrome, bilateral upper limbs: Secondary | ICD-10-CM

## 2014-12-28 DIAGNOSIS — M5136 Other intervertebral disc degeneration, lumbar region: Secondary | ICD-10-CM | POA: Insufficient documentation

## 2014-12-28 DIAGNOSIS — M47812 Spondylosis without myelopathy or radiculopathy, cervical region: Secondary | ICD-10-CM | POA: Insufficient documentation

## 2014-12-28 DIAGNOSIS — M47816 Spondylosis without myelopathy or radiculopathy, lumbar region: Secondary | ICD-10-CM | POA: Insufficient documentation

## 2014-12-28 DIAGNOSIS — Z9889 Other specified postprocedural states: Secondary | ICD-10-CM | POA: Insufficient documentation

## 2014-12-28 DIAGNOSIS — M5030 Other cervical disc degeneration, unspecified cervical region: Secondary | ICD-10-CM

## 2014-12-28 MED ORDER — OXYCODONE HCL 10 MG PO TABS: ORAL_TABLET | ORAL | Status: DC

## 2014-12-28 MED ORDER — DICLOFENAC SODIUM 50 MG PO TBEC: DELAYED_RELEASE_TABLET | ORAL | Status: DC

## 2014-12-28 NOTE — Patient Instructions (Addendum)
PLAN   Continue present medication diclofenac and oxycodone  F/U PCP Dr. Laroy AppleKrebs for evaliation of  BP and general medical  condition  F/U surgical evaluation as discussed  F/U neurological evaluation. May consider pending follow-up evaluations  May consider radiofrequency rhizolysis or intraspinal procedures pending response to present treatment and F/U evaluation   Patient to call [facility] should patient have concerns prior to scheduled return appointment.Facet Blocks Patient Information  Description: The facets are joints in the spine between the vertebrae.  Like any joints in the body, facets can become irritated and painful.  Arthritis can also effect the facets.  By injecting steroids and local anesthetic in and around these joints, we can temporarily block the nerve supply to them.  Steroids act directly on irritated nerves and tissues to reduce selling and inflammation which often leads to decreased pain.  Facet blocks may be done anywhere along the spine from the neck to the low back depending upon the location of your pain.   After numbing the skin with local anesthetic (like Novocaine), a small needle is passed onto the facet joints under x-ray guidance.  You may experience a sensation of pressure while this is being done.  The entire block usually lasts about 15-25 minutes.   Conditions which may be treated by facet blocks:   Low back/buttock pain  Neck/shoulder pain  Certain types of headaches  Preparation for the injection:  1. Do not eat any solid food or dairy products within 6 hours of your appointment. 2. You may drink clear liquid up to 2 hours before appointment.  Clear liquids include water, black coffee, juice or soda.  No milk or cream please. 3. You may take your regular medication, including pain medications, with a sip of water before your appointment.  Diabetics should hold regular insulin (if taken separately) and take 1/2 normal NPH dose the  morning of the procedure.  Carry some sugar containing items with you to your appointment. 4. A driver must accompany you and be prepared to drive you home after your procedure. 5. Bring all your current medications with you. 6. An IV may be inserted and sedation may be given at the discretion of the physician. 7. A blood pressure cuff, EKG and other monitors will often be applied during the procedure.  Some patients may need to have extra oxygen administered for a short period. 8. You will be asked to provide medical information, including your allergies and medications, prior to the procedure.  We must know immediately if you are taking blood thinners (like Coumadin/Warfarin) or if you are allergic to IV iodine contrast (dye).  We must know if you could possible be pregnant.  Possible side-effects:   Bleeding from needle site  Infection (rare, may require surgery)  Nerve injury (rare)  Numbness & tingling (temporary)  Difficulty urinating (rare, temporary)  Spinal headache (a headache worse with upright posture)  Light-headedness (temporary)  Pain at injection site (serveral days)  Decreased blood pressure (rare, temporary)  Weakness in arm/leg (temporary)  Pressure sensation in back/neck (temporary)   Call if you experience:   Fever/chills associated with headache or increased back/neck pain  Headache worsened by an upright position  New onset, weakness or numbness of an extremity below the injection site  Hives or difficulty breathing (go to the emergency room)  Inflammation or drainage at the injection site(s)  Severe back/neck pain greater than usual  New symptoms which are concerning to you  Please note:  Although the local anesthetic injected can often make your back or neck feel good for several hours after the injection, the pain will likely return. It takes 3-7 days for steroids to work.  You may not notice any pain relief for at least one week.  If  effective, we will often do a series of 2-3 injections spaced 3-6 weeks apart to maximally decrease your pain.  After the initial series, you may be a candidate for a more permanent nerve block of the facets.  If you have any questions, please call #[phone] [facility]

## 2014-12-28 NOTE — Progress Notes (Signed)
Safety precautions to be maintained throughout the outpatient stay will include: orient to surroundings, keep bed in low position, maintain call bell within reach at all times, provide assistance with transfer out of bed and ambulation.  Oxycodone prescription given to patient. 

## 2014-12-28 NOTE — Progress Notes (Signed)
   Subjective:    Patient ID: Gary Schultz, male    DOB: 07/21/1965, 49 y.o.   MRN: [id number]  HPI patient is 49 year old gentleman returns to pain management for further evaluation and treatment of pain involving the neck and upper extremity regions lower back and lower extremity region. Patient has pain at the base of the thumb of the right hand. Patient states that the pain is aggravated by activities on the job as well. Patient is lower back worsening pain as well as pain involving the neck. We discussed the use of cervical pillow as well as interventional treatment. At the present time patient is without insurance and is unable to undergo interventional treatment. We will continue Voltaren and oxycodone at this time and we have also discussed surgical evaluation with patient again on today's visit. The patient is in agreement with suggested treatment plan.    Review of Systems     Objective:   Physical Exam  There was tenderness to palpation of the splenius CNS of the talus musculature begins of moderate to moderately severe degree but limited range of motion of the cervical spine. There was moderate tenderness to palpation over the cervical facet cervical paraspinal musculature regions. There appeared to be unremarkable Spurling's maneuver. There was tenderness to palpation base of the thumb of the right hand with no increased warmth or erythema noted. There was decreased grip strength on the right compared to the left. Tinel impingement associated with moderate discomfort. Palpation of the thoracic facet thoracic paraspinal muscles reproduces pain in a moderate degree with mild muscle spasm noted in the thoracic region especially the lower thoracic region. Palpation over the lumbar paraspinal muscles lumbar facet region reproduces moderate discomfort as well there is moderate tenderness to palpation over the PSIS and PI IS regions. Straight leg raising was tolerates approximately 30 without  increased pain with dorsiflexion noted. No definite sensory deficit or dermatomal distribution detected name conus negative Homans abdomen nontender with no costovertebral angle tenderness noted.      Assessment & Plan:    Degenerative disc disease lumbar spine Multilevel lumbar degenerative changes Status post lumbar surgery  Lumbar facet syndrome  Degenerative changes cervical spine C5 and C5-6 degenerative changes especially  Cervical facet syndrome  Carpal tunnel syndrome  Degenerative joint disease of the hands   PLAN   Continue present medication diclofenac and oxycodone  F/U PCP Dr. Laroy AppleKrebs for evaliation of  BP and general medical  condition  F/U surgical evaluation as discussed. Patient will consider surgical evaluation of her hands as discussed  F/U neurological evaluation. May consider pending follow-up evaluations  May consider radiofrequency rhizolysis or intraspinal procedures pending response to present treatment and F/U evaluation . At the present time patient is without insurance and is unable to undergo interventional treatment  Patient to call [facility] should patient have concerns prior to scheduled return appointment

## 2015-01-25 ENCOUNTER — Encounter: Payer: Self-pay | Admitting: Pain Medicine

## 2015-01-25 ENCOUNTER — Ambulatory Visit: Payer: Self-pay | Attending: Pain Medicine | Admitting: Pain Medicine

## 2015-01-25 VITALS — BP 139/102 | HR 60 | Temp 98.5°F | Resp 15 | Ht 73.0 in | Wt 210.0 lb

## 2015-01-25 DIAGNOSIS — M47816 Spondylosis without myelopathy or radiculopathy, lumbar region: Secondary | ICD-10-CM

## 2015-01-25 DIAGNOSIS — M19041 Primary osteoarthritis, right hand: Secondary | ICD-10-CM | POA: Insufficient documentation

## 2015-01-25 DIAGNOSIS — M5030 Other cervical disc degeneration, unspecified cervical region: Secondary | ICD-10-CM | POA: Insufficient documentation

## 2015-01-25 DIAGNOSIS — Z9889 Other specified postprocedural states: Secondary | ICD-10-CM | POA: Insufficient documentation

## 2015-01-25 DIAGNOSIS — G5600 Carpal tunnel syndrome, unspecified upper limb: Secondary | ICD-10-CM | POA: Insufficient documentation

## 2015-01-25 DIAGNOSIS — G5603 Carpal tunnel syndrome, bilateral upper limbs: Secondary | ICD-10-CM

## 2015-01-25 DIAGNOSIS — M5136 Other intervertebral disc degeneration, lumbar region: Secondary | ICD-10-CM | POA: Insufficient documentation

## 2015-01-25 DIAGNOSIS — M47812 Spondylosis without myelopathy or radiculopathy, cervical region: Secondary | ICD-10-CM | POA: Insufficient documentation

## 2015-01-25 MED ORDER — OXYCODONE HCL 10 MG PO TABS: ORAL_TABLET | ORAL | Status: DC

## 2015-01-25 MED ORDER — DICLOFENAC SODIUM 50 MG PO TBEC: DELAYED_RELEASE_TABLET | ORAL | Status: DC

## 2015-01-25 NOTE — Progress Notes (Signed)
Subjective:    Patient ID: Gary Schultz, male    DOB: 12/31/1965, 49 y.o.   MRN: [id number]  HPI  The patient is a 49 year old gentleman who returns to pain management for further evaluation and treatment of pain involving the neck upper extremity regions mid lower back and lower extremity region. On today's visit we informed patient that he would benefit from interventional treatment as well as proceeding with surgical evaluation. The patient is with pain involving multiple regions including the mid lower back lower extremity regions hands elbows and neck. We discussed medications and informed patient we will for to avoid increasing medications at this time and that patient should consider interventional treatment as well as surgical evaluation. The patient is in need of insurance evidence emphasized patient the need to proceed with obtaining insurance so that he may proceed with additional treatment of his conditions. The patient was understanding and will attempt to obtain insurance of we have distressed. Caution patient regarding the use of Voltaren which can exacerbate his blood pressure and we also emphasized the need to minimize the use of oxycodone. The patient was understanding and appeared to be in agreement with suggested treatment plan. The patient is to follow-up with Dr. [name] regarding blood pressure and general medical condition as discussed. The patient agreed to suggested treatment plan       Review of Systems     Objective:   Physical Exam  There was tends to palpation of paraspinal muscles and cervical and cervical facet region a moderate degree. There was limited range of motion of the cervical spine with palpation over the cervical facets reproducing moderate discomfort. There was tenderness of the splenius capitis and occipitalis regions as well patient appeared to be with decreased grip strength on the right compared to the left with Tinel and Phalen's maneuver  reproducing moderate discomfort. There was unremarkable Spurling's maneuver There was tenderness over the thoracic facet thoracic paraspinal must reason with no crepitus of the thoracic region noted. Palpation over the lumbar paraspinal muscles lumbar facet region was attends palpation of moderate degree with lateral bending rotation extension and palpation of the lumbar facets reproducing moderate discomfort. DTRs appeared to be trace at the knee straight leg raise was limited to approximately 20 without increased pain with dorsiflexion noted there was negative clonus negative Homans. There was tends to palpation of the PSIS and PII S region a moderate degree as well abdomen was nontender with no costovertebral tenderness noted the abdomen was nontender and no costovertebral tenderness was noted.      Assessment & Plan:    Degenerative disc disease lumbar spine Multilevel lumbar degenerative changes Status post lumbar surgery  Lumbar facet syndrome  Degenerative changes cervical spine C5 and C5-6 degenerative changes especially  Cervical facet syndrome  Carpal tunnel syndrome  Degenerative joint disease of the hands    PLAN  Continue present medication diclofenac and oxycodone  F/U PCP Dr. [name] for evaliation of  BP and general medical  condition. Needs to see your primary care physician Dr Gary Schultz this week for elevated blood pressure as discussed. Remember diclofenac  (Voltaren) also can increase your blood pressure. Limit diclofenac use   F/U surgical evaluation as discussed  Ask receptionist date [facility]  F/U neurological evaluation. May consider pending follow-up evaluations  May consider radiofrequency rhizolysis or intraspinal procedures pending response to present treatment and F/U evaluation   Patient to call [facility] should  patient have concerns prior to scheduled return appointment

## 2015-01-25 NOTE — Progress Notes (Signed)
Safety precautions to be maintained throughout the outpatient stay will include: orient to surroundings, keep bed in low position, maintain call bell within reach at all times, provide assistance with transfer out of bed and ambulation.  

## 2015-01-25 NOTE — Patient Instructions (Addendum)
PLAN   Continue present medication diclofenac and oxycodone  F/U PCP Dr. Laroy AppleKrebs for evaliation of  BP and general medical  condition. Needs to see your primary care physician Dr Laroy AppleKrebs this week for elevated blood pressure as discussed. Remember diclofenac  (Voltaren) also can increase your blood pressure. Limit diclofenac use   F/U surgical evaluation as discussed  Ask receptionist date of [facility]  F/U neurological evaluation. May consider pending follow-up evaluations  May consider radiofrequency rhizolysis or intraspinal procedures pending response to present treatment and F/U evaluation   Patient to call [facility] should patient have concerns prior to scheduled return appointment  A prescription for OXYCODONE was given to you today.  A prescription for DICLOFENAC was sent to your pharmacy and should be available for pickup today.

## 2015-02-13 ENCOUNTER — Other Ambulatory Visit: Payer: Self-pay | Admitting: Pain Medicine

## 2015-02-15 ENCOUNTER — Encounter: Payer: Self-pay | Admitting: Pain Medicine

## 2015-02-15 ENCOUNTER — Ambulatory Visit: Payer: Self-pay | Attending: Pain Medicine | Admitting: Pain Medicine

## 2015-02-15 VITALS — BP 134/89 | HR 85 | Temp 98.1°F | Resp 18 | Ht 73.0 in | Wt 222.0 lb

## 2015-02-15 DIAGNOSIS — G5600 Carpal tunnel syndrome, unspecified upper limb: Secondary | ICD-10-CM | POA: Insufficient documentation

## 2015-02-15 DIAGNOSIS — M5136 Other intervertebral disc degeneration, lumbar region: Secondary | ICD-10-CM | POA: Insufficient documentation

## 2015-02-15 DIAGNOSIS — M47816 Spondylosis without myelopathy or radiculopathy, lumbar region: Secondary | ICD-10-CM | POA: Insufficient documentation

## 2015-02-15 DIAGNOSIS — M5030 Other cervical disc degeneration, unspecified cervical region: Secondary | ICD-10-CM | POA: Insufficient documentation

## 2015-02-15 DIAGNOSIS — M542 Cervicalgia: Secondary | ICD-10-CM | POA: Insufficient documentation

## 2015-02-15 DIAGNOSIS — M19041 Primary osteoarthritis, right hand: Secondary | ICD-10-CM | POA: Insufficient documentation

## 2015-02-15 DIAGNOSIS — M545 Low back pain: Secondary | ICD-10-CM | POA: Insufficient documentation

## 2015-02-15 DIAGNOSIS — M47812 Spondylosis without myelopathy or radiculopathy, cervical region: Secondary | ICD-10-CM | POA: Insufficient documentation

## 2015-02-15 DIAGNOSIS — G5603 Carpal tunnel syndrome, bilateral upper limbs: Secondary | ICD-10-CM

## 2015-02-15 DIAGNOSIS — Z9889 Other specified postprocedural states: Secondary | ICD-10-CM | POA: Insufficient documentation

## 2015-02-15 MED ORDER — OXYCODONE HCL 10 MG PO TABS: ORAL_TABLET | ORAL | Status: DC

## 2015-02-15 MED ORDER — DICLOFENAC SODIUM 50 MG PO TBEC: DELAYED_RELEASE_TABLET | ORAL | Status: DC

## 2015-02-15 NOTE — Progress Notes (Signed)
Safety precautions to be maintained throughout the outpatient stay will include: orient to surroundings, keep bed in low position, maintain call bell within reach at all times, provide assistance with transfer out of bed and ambulation.  

## 2015-02-15 NOTE — Patient Instructions (Signed)
PLAN   Continue present medication diclofenac and oxycodone  F/U PCP Dr. Laroy AppleKrebs for evaliation of  BP and general medical  condition. Remember diclofenac  (Voltaren) also can increase your blood pressure. Limit diclofenac use   F/U surgical evaluation as discussed  Ask receptionist date of [facility]  F/U neurological evaluation. May consider pending follow-up evaluations  May consider radiofrequency rhizolysis or intraspinal procedures pending response to present treatment and F/U evaluation

## 2015-02-15 NOTE — Progress Notes (Signed)
Subjective:    Patient ID: Gary Schultz, male    DOB: 11/08/1965, 49 y.o.   MRN: [id number]  HPI  The patient is a 49 year old gentleman who returns to pain management Center for further evaluation and treatment of pain involving the neck shoulders and have back upper and lower extremity regions. The patient continues to have pain involving the region of the right shoulder as well as significant pain involving the right hand at the base of the thumb. 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 We discussed patient's overall condition and patient will proceed with further evaluation as discussed. At the present time patient states that he is without insurance coverage and that would like to wait until he obtains insurance prior to proceeding with his neurosurgical and orthopedic evaluations as we have repeatedly recommended. The patient is looking forward to obtaining insurance coverage. 7. We also discussed patient's use of apparent and patient will discuss the role pterin use with his primary care physician Dr.Krebs . We have discussed the effects of low pterin on patient's blood pressure and cardiac condition. The patient will also discussed the use of daily aspirin with his primary care physician and we will continue medication Voltaren and oxycodone as prescribed at this time. We will also remain available to proceed with interventional treatment was patient obtains insurance covers as we have discussed repeatedly. The patient is understanding and agreed with suggested treatment plan.    Review of Systems     Objective:   Physical Exam   There was tenderness to palpation of paraspinal muscular treat and cervical region cervical facet region palpation which reproduces pain of moderate degree. There was limited range of motion of the cervical spine with unremarkable Spurling's maneuver. Palpation of the acromioclavicular and  glenohumeral joint regions reproduced pain of moderate degree and patient was with decreased grip strength of the right upper extremity compared to the left upper extremity with tenderness to palpation of the base of the right thumb. There was mild to moderate increase of pain with Tinel and Phalen's maneuver. The patient was a tennis to palpation of the thoracic facet thoracic paraspinal musculature region a moderate degree with no crepitus of the thoracic region noted. Palpation over the lumbar paraspinal musculatures and lumbar facet region was attends to palpation of moderate degree with lateral bending rotation extension and palpation of the lumbar facets reproducing moderate discomfort. Straight leg raising was tolerates approximately 30 without increased pain with dorsiflexion noted. EHL strength appeared to be decreased and DTRs were trace at the knees. There was negative clonus negative Homans. There was tenderness over the PSIS and P IIS region a moderate degree. No definite sensory deficit or dermatomal distribution was detected. There was moderate tenderness to palpation of the greater trochanteric region and iliotibial band region. Abdomen was nontender and no costovertebral tenderness was noted.     Assessment & Plan:    Degenerative disc disease lumbar spine Multilevel lumbar degenerative changes Status post lumbar surgery  Lumbar facet syndrome  Degenerative changes cervical spine C5 and C5-6 degenerative changes especially  Cervical facet syndrome  Carpal tunnel syndrome  Degenerative joint disease of the hands     PLAN   Continue present medication diclofenac and oxycodone  F/U PCP Dr. Laroy AppleKrebs for evaliation of  BP and general medical  condition. Remember diclofenac  (Voltaren) also can increase your blood pressure. Limit diclofenac use   F/U surgical  evaluation as discussed  Ask receptionist date of your pain clinic appointment at [facility] pain clinic  F/U  neurological evaluation. May consider pending follow-up evaluations  May consider radiofrequency rhizolysis or intraspinal procedures pending response to present treatment and F/U evaluation

## 2015-02-18 DIAGNOSIS — S83209A Unspecified tear of unspecified meniscus, current injury, unspecified knee, initial encounter: Secondary | ICD-10-CM

## 2015-02-18 HISTORY — DX: Unspecified tear of unspecified meniscus, current injury, unspecified knee, initial encounter: S83.209A

## 2015-02-20 ENCOUNTER — Encounter: Payer: Self-pay | Admitting: Pain Medicine

## 2015-03-13 ENCOUNTER — Other Ambulatory Visit: Payer: Self-pay | Admitting: Family Medicine

## 2015-03-14 ENCOUNTER — Encounter: Payer: Self-pay | Admitting: Pain Medicine

## 2015-03-14 ENCOUNTER — Ambulatory Visit: Payer: Self-pay | Attending: Pain Medicine | Admitting: Pain Medicine

## 2015-03-14 VITALS — BP 149/98 | HR 63 | Temp 97.9°F | Resp 16 | Ht 73.0 in | Wt 220.0 lb

## 2015-03-14 DIAGNOSIS — M47812 Spondylosis without myelopathy or radiculopathy, cervical region: Secondary | ICD-10-CM | POA: Insufficient documentation

## 2015-03-14 DIAGNOSIS — M47816 Spondylosis without myelopathy or radiculopathy, lumbar region: Secondary | ICD-10-CM | POA: Insufficient documentation

## 2015-03-14 DIAGNOSIS — M19042 Primary osteoarthritis, left hand: Secondary | ICD-10-CM | POA: Insufficient documentation

## 2015-03-14 DIAGNOSIS — G5603 Carpal tunnel syndrome, bilateral upper limbs: Secondary | ICD-10-CM

## 2015-03-14 DIAGNOSIS — G5600 Carpal tunnel syndrome, unspecified upper limb: Secondary | ICD-10-CM | POA: Insufficient documentation

## 2015-03-14 DIAGNOSIS — M19041 Primary osteoarthritis, right hand: Secondary | ICD-10-CM | POA: Insufficient documentation

## 2015-03-14 DIAGNOSIS — Z9889 Other specified postprocedural states: Secondary | ICD-10-CM | POA: Insufficient documentation

## 2015-03-14 DIAGNOSIS — M5136 Other intervertebral disc degeneration, lumbar region: Secondary | ICD-10-CM | POA: Insufficient documentation

## 2015-03-14 DIAGNOSIS — M5030 Other cervical disc degeneration, unspecified cervical region: Secondary | ICD-10-CM

## 2015-03-14 MED ORDER — OXYCODONE HCL 10 MG PO TABS: ORAL_TABLET | ORAL | Status: DC

## 2015-03-14 MED ORDER — DICLOFENAC SODIUM 50 MG PO TBEC: DELAYED_RELEASE_TABLET | ORAL | Status: DC

## 2015-03-14 NOTE — Progress Notes (Signed)
Safety precautions to be maintained throughout the outpatient stay will include: orient to surroundings, keep bed in low position, maintain call bell within reach at all times, provide assistance with transfer out of bed and ambulation.  

## 2015-03-14 NOTE — Patient Instructions (Signed)
PLAN   Continue present medication diclofenac and oxycodone  F/U PCP Dr. Laroy Apple for evaliation of  BP and general medical  condition. Remember diclofenac  (Voltaren) also can increase your blood pressure. Limit diclofenac use as we discussed   F/U surgical evaluation as discussed  Ask receptionist date of your pain clinic appointment at [facility] clinic  F/U neurological evaluation. May consider pending follow-up evaluations  May consider radiofrequency rhizolysis or intraspinal procedures pending response to present treatment and F/U evaluation

## 2015-03-14 NOTE — Progress Notes (Signed)
   Subjective:    Patient ID: Gary Schultz, male    DOB: 02/25/1965, 50 y.o.   MRN: [id number]  HPI  Patient is a 50 year old gentleman who returns to pain management for further evaluation and treatment of pain involving the neck upper extremity regions mid lower back and lower extremity regions. The patient continues to be with significant pain involving the region of the upper extremities and continues to work as a Curator. We discussed patient undergoing further evaluation at [facility] pain clinic and patient at present time prefers to delay evaluation due to finances. We discussed patient's overall condition and will continue medications as discussed. Caution patient regarding the use of Voltaren in terms of his blood pressure and GI as well as renal effects and patient is to continue the care of Dr. Laroy Apple for continued evaluation of patient's laboratory values and general medical condition. We will provide patient with prescriptions for Voltaren and oxycodone at this time and we'll remain available to consider interventional treatment once patient has insurance to proceed with such treatment. The patient denies any trauma change in events of daily living the call significant change in symptomatology although agreed to suggested treatment plan  Review of Systems     Objective:   Physical Exam  There was tenderness of the splenius capitis and occipitalis musculature region with limited range of motion of the cervical spine. There appeared to be unremarkable Spurling's maneuver. There was tenderness of the acromioclavicular and glenohumeral joint region a moderate degree with patient appeared to be with increased pain with Tinel and Phalen's maneuver were tenderness to palpation of base of the right thumb especially without increased warmth and erythema in the region of the hand. There was tenderness to palpation of the thoracic facet thoracic paraspinal musculature region was evidence muscle spasms  occurring in thoracic paraspinal muscular treat without crepitus of the thoracic region noted. Palpation over the lumbar paraspinal musculature region lumbar facet region was with moderate to moderately severe discomfort with lateral bending rotation extension and palpation of the lumbar facets reproducing moderately severe discomfort. Mild tenderness of the greater trochanteric region iliotibial band region. There was mild to moderate tenderness of the PSIS and PII S region as well as the gluteal and piriformis musculature region. No definite sensory deficit or dermatomal distribution detected. There was negative clonus negative Homans. Abdomen was nontender with no costovertebral tenderness noted.      Assessment & Plan:     Degenerative disc disease lumbar spine Multilevel lumbar degenerative changes Status post lumbar surgery  Lumbar facet syndrome  Degenerative changes cervical spine C5 and C5-6 degenerative changes especially  Cervical facet syndrome  Carpal tunnel syndrome  Degenerative joint disease of the hands    PLAN   Continue present medication diclofenac and oxycodone  F/U PCP Dr. Laroy Apple for evaliation of  BP and general medical  condition. Remember diclofenac  (Voltaren) also can increase your blood pressure. Limit diclofenac use as we discussed   F/U surgical evaluation as discussed  Ask receptionist date of your pain clinic appointment at [facility] pain clinic  F/U neurological evaluation. May consider pending follow-up evaluations  May consider radiofrequency rhizolysis or intraspinal procedures pending response to present treatment and F/U evaluation

## 2015-03-26 ENCOUNTER — Other Ambulatory Visit: Payer: Self-pay | Admitting: Pain Medicine

## 2015-04-10 ENCOUNTER — Other Ambulatory Visit: Payer: Self-pay | Admitting: Family Medicine

## 2015-04-12 ENCOUNTER — Ambulatory Visit: Payer: Self-pay | Attending: Pain Medicine | Admitting: Pain Medicine

## 2015-04-12 ENCOUNTER — Encounter: Payer: Self-pay | Admitting: Pain Medicine

## 2015-04-12 VITALS — BP 144/84 | HR 88 | Temp 97.8°F | Resp 16 | Ht 73.0 in | Wt 222.0 lb

## 2015-04-12 DIAGNOSIS — Z9889 Other specified postprocedural states: Secondary | ICD-10-CM | POA: Insufficient documentation

## 2015-04-12 DIAGNOSIS — M47816 Spondylosis without myelopathy or radiculopathy, lumbar region: Secondary | ICD-10-CM | POA: Insufficient documentation

## 2015-04-12 DIAGNOSIS — M5030 Other cervical disc degeneration, unspecified cervical region: Secondary | ICD-10-CM

## 2015-04-12 DIAGNOSIS — G5600 Carpal tunnel syndrome, unspecified upper limb: Secondary | ICD-10-CM | POA: Insufficient documentation

## 2015-04-12 DIAGNOSIS — M5136 Other intervertebral disc degeneration, lumbar region: Secondary | ICD-10-CM | POA: Insufficient documentation

## 2015-04-12 DIAGNOSIS — M47812 Spondylosis without myelopathy or radiculopathy, cervical region: Secondary | ICD-10-CM | POA: Insufficient documentation

## 2015-04-12 DIAGNOSIS — G5603 Carpal tunnel syndrome, bilateral upper limbs: Secondary | ICD-10-CM

## 2015-04-12 MED ORDER — OXYCODONE HCL 10 MG PO TABS: ORAL_TABLET | ORAL | Status: DC

## 2015-04-12 MED ORDER — DICLOFENAC SODIUM 50 MG PO TBEC: DELAYED_RELEASE_TABLET | ORAL | Status: DC

## 2015-04-12 NOTE — Patient Instructions (Addendum)
PLAN   Continue present medication diclofenac and oxycodone  F/U PCP Dr. Laroy Apple for evaliation of  BP and general medical  condition. Remember diclofenac  (Voltaren) also can increase your blood pressure. Limit diclofenac use as we discussed  F/U surgical evaluation as discussed  Rheumatological evaluation to be considered as discussed  Evaluation by hand surgeon as discussed. Recommend discuss shoulders as well when discuss hand with surgical  Appointment at [facility] pain clinic may proceed with evaluation as discussed. We will delay at this time as discussed  F/U neurological evaluation. May consider pending follow-up evaluations  May consider radiofrequency rhizolysis or intraspinal procedures pending response to present treatment and F/U evaluation   Patient is to call pain management for any concerns regarding condition prior to scheduled return appointment

## 2015-04-12 NOTE — Progress Notes (Signed)
Safety precautions to be maintained throughout the outpatient stay will include: orient to surroundings, keep bed in low position, maintain call bell within reach at all times, provide assistance with transfer out of bed and ambulation.  

## 2015-04-12 NOTE — Progress Notes (Signed)
Subjective:    Patient ID: Gary Schultz, male    DOB: 1965/06/25, 50 y.o.   MRN: [id number]  HPI  The patient is a 50 year old gentleman who returns to pain management for further evaluation and treatment of pain involving the neck upper extremity regions mid back and lower extremity regions as well as the lower back region.. The patient continues to work and has significant pain involving the right hand with pain occurring at the base of the right thumb. The patient also has limited range of motion of the cervical spine and pain of the lumbar and lower extremity regions WHICH appear to be aggravated by activities on the job. The patient has attempted to perform activities on the job despite his present condition. We discussed patient's condition at the present time we wish to have patient undergo evaluation of his hand by hand surgeon. We have also discussed reevaluation of patient by neurosurgeon as well as interventional treatment. At the present time patient is awaiting to obtain insurance. We will continue diclofenac and oxycodone at this time and remain available to consider modifications of treatment pending follow-up evaluation. All agreed to suggested treatment plan.  Review of Systems     Objective:   Physical Exam  There was tenderness of the splenius capitis and occipitalis musculature regions palpation which be produced pain of moderate degree. There was limited range of motion of the cervical spine with rotation of the spine reproducing pain on the left as well as on the right. There was unremarkable Spurling's maneuver. The patient was with decreased grip strength on the right compared to the left with the left hand small of in the right hand. The right hand appeared larger and was with tenderness to palpation at the metacarpophalangeal joint especially. There was no definite increased warmth and erythema noted. There was tenderness to palpation of the medial and lateral  epicondyles of the elbows without increased warmth and erythema noted. Palpation over the thoracic region was attends to palpation with significant muscle spasm of the thoracic region a moderate degree. Palpation over the lumbar paraspinal musculature region lumbar facet region was attends to palpation of moderately severe degree. There was moderate tenderness of the PSIS and PII S region. Straight leg raising was limited to approximately 20 without a definite increased pain with dorsiflexion noted EHL strength appeared to be slightly decreased. There was negative clonus negative Homans. Palpation of the greater trochanteric region and iliotibial band region was with reproduction of mild discomfort. The knees were attends to palpation with negative anterior and posterior drawer signs without ballottement of the patella abdomen was nontender with no costovertebral tenderness noted      Assessment & Plan:     Degenerative disc disease lumbar spine Multilevel lumbar degenerative changes Status post lumbar surgery  Lumbar facet syndrome  Degenerative changes cervical spine C5 and C5-6 degenerative changes especially  Cervical facet syndrome  Carpal tunnel syndrome    PLAN   Continue present medication diclofenac and oxycodone  F/U PCP Dr. Laroy Apple for evaliation of  BP and general medical  condition. Remember diclofenac  (Voltaren) also can increase your blood pressure. Limit diclofenac use as we discussed  F/U surgical evaluation as discussed  Rheumatological evaluation to be considered as discussed  Evaluation by hand surgeon as discussed. Recommend discuss shoulders as well when discuss hand with surgical  Appointment at [facility] pain clinic may proceed with evaluation as discussed. We will delay at this time as discussed  F/U neurological  evaluation. May consider pending follow-up evaluations  May consider radiofrequency rhizolysis or intraspinal procedures pending response to  present treatment and F/U evaluation   Patient is to call pain management for any concerns regarding condition prior to scheduled return appointment

## 2015-05-10 ENCOUNTER — Ambulatory Visit: Payer: Self-pay | Attending: Pain Medicine | Admitting: Pain Medicine

## 2015-05-10 ENCOUNTER — Other Ambulatory Visit: Payer: Self-pay | Admitting: Family Medicine

## 2015-05-10 ENCOUNTER — Encounter: Payer: Self-pay | Admitting: Pain Medicine

## 2015-05-10 VITALS — BP 134/82 | HR 92 | Temp 98.7°F | Resp 16 | Ht 73.0 in | Wt 220.0 lb

## 2015-05-10 DIAGNOSIS — G5600 Carpal tunnel syndrome, unspecified upper limb: Secondary | ICD-10-CM | POA: Insufficient documentation

## 2015-05-10 DIAGNOSIS — G5603 Carpal tunnel syndrome, bilateral upper limbs: Secondary | ICD-10-CM

## 2015-05-10 DIAGNOSIS — M47812 Spondylosis without myelopathy or radiculopathy, cervical region: Secondary | ICD-10-CM | POA: Insufficient documentation

## 2015-05-10 DIAGNOSIS — Z9889 Other specified postprocedural states: Secondary | ICD-10-CM | POA: Insufficient documentation

## 2015-05-10 DIAGNOSIS — M47816 Spondylosis without myelopathy or radiculopathy, lumbar region: Secondary | ICD-10-CM | POA: Insufficient documentation

## 2015-05-10 DIAGNOSIS — M19049 Primary osteoarthritis, unspecified hand: Secondary | ICD-10-CM | POA: Insufficient documentation

## 2015-05-10 DIAGNOSIS — M5030 Other cervical disc degeneration, unspecified cervical region: Secondary | ICD-10-CM

## 2015-05-10 DIAGNOSIS — M5136 Other intervertebral disc degeneration, lumbar region: Secondary | ICD-10-CM | POA: Insufficient documentation

## 2015-05-10 MED ORDER — OXYCODONE HCL 10 MG PO TABS: ORAL_TABLET | ORAL | Status: DC

## 2015-05-10 MED ORDER — DICLOFENAC SODIUM 50 MG PO TBEC: DELAYED_RELEASE_TABLET | ORAL | Status: DC

## 2015-05-10 NOTE — Progress Notes (Signed)
Subjective:    Patient ID: Gary Schultz, male    DOB: Feb 03, 1966, 50 y.o.   MRN: [id number]  HPI  The patient is a 50 year old gentleman who returns to pain management for further evaluation and treatment of pain involving the neck upper extremity regions mid back lower back and lower extremity region. The patient continues to have pain of moderate to moderately severe degree with significant pain involving the right hand. We have discussed patient undergoing evaluation of his right hand hand surgeon. At the present time patient will continue medications consisting of oxycodone and diclofenac. The patient states that he does have weakness of grip due to the pain occurring in the region of the right hand at the base of the thumb. The patient also admits to pain occurring in the neck upper mid lower back and lower extremity regions. We will avoid interventional treatment at this time due to patient's insurance status. The patient is to call pain management should there be change in condition or have other concerns regarding condition prior to scheduled return appointment. We will continue medications as prescribed. The patient is with understanding and agreed to suggested treatment plan.   Review of Systems     Objective:   Physical Exam  Palpation of the cervical facet cervical paraspinal must reason was attends to palpation of moderate degree with limited range of motion of the cervical spine. There was tenderness over the region of the splenius capitis and occipitalis musculature regions a moderate degree. Palpation of the acromioclavicular and glenohumeral joint regions reproduce moderate discomfort. The patient was with unremarkable Spurling's maneuver. The patient was with decreased grip strength on the right compared to the left with moderate to moderately severe tenderness to palpation of the metacarpophalangeal joint at the base of the right thumb. There was no increased warmth and  erythema in the region of the hand. Tinel and Phalen's maneuver were with reproduction of mild discomfort. Palpation of the thoracic facet thoracic paraspinal must reason was attends to palpation of moderate degree with moderate muscle spasm of the mid and lower thoracic region. Palpation over the lumbar paraspinal must reason lumbar facet region was with moderate to moderately severe discomfort. Lateral bending rotation extension and palpation of the lumbar facets reproduced moderate to moderately severe discomfort. There was moderate tenderness of the PSIS and PII S regions as well palpation of the greater trochanteric region iliotibial band region was without increase of pain of significant degree. Straight leg raising was limited to approximately 30 without a definite increase of pain with dorsiflexion noted. DTRs were difficult to elicit there was crepitus of the knees noted with negative anterior and posterior drawer signs without ballottement of the patella. He deficit of the lower extremities were noted. There was negative clonus negative Homans. Abdomen nontender with no costovertebral tenderness noted.      Assessment & Plan:     Degenerative disc disease lumbar spine Multilevel lumbar degenerative changes Status post lumbar surgery  Lumbar facet syndrome  Degenerative changes cervical spine C5 and C5-6 degenerative changes especially  Cervical facet syndrome  Carpal tunnel syndrome  Degenerative joint disease of hand     PLAN   Continue present medication diclofenac and oxycodone  F/U PCP Dr. Laroy Apple for evaliation of  BP and general medical  condition. Remember diclofenac  (Voltaren) also can increase your blood pressure. Limit diclofenac use as we discussed previously  F/U surgical evaluation as discussed  Rheumatological evaluation to be considered as discussed previously  Evaluation by hand surgeon as discussed. Recommend discuss shoulders as well when discuss hand  with surgeon  [facility] We will delay at this time as discussed  F/U neurological evaluation. May consider pending follow-up evaluations  May consider radiofrequency rhizolysis or intraspinal procedures pending response to present treatment and F/U evaluation   Patient is to call pain management for any concerns regarding condition prior to scheduled return appointment

## 2015-05-10 NOTE — Patient Instructions (Signed)
PLAN   Continue present medication diclofenac and oxycodone  F/U PCP Dr. Laroy AppleKrebs for evaliation of  BP and general medical  condition. Remember diclofenac  (Voltaren) also can increase your blood pressure. Limit diclofenac use as we discussed previously  F/U surgical evaluation as discussed  Rheumatological evaluation to be considered as discussed previously  Evaluation by hand surgeon as discussed. Recommend discuss shoulders as well when discuss hand with surgeon  [facility] We will delay at this time as discussed  F/U neurological evaluation. May consider pending follow-up evaluations  May consider radiofrequency rhizolysis or intraspinal procedures pending response to present treatment and F/U evaluation   Patient is to call pain management for any concerns regarding condition prior to scheduled return appointment

## 2015-05-10 NOTE — Progress Notes (Signed)
Safety precautions to be maintained throughout the outpatient stay will include: orient to surroundings, keep bed in low position, maintain call bell within reach at all times, provide assistance with transfer out of bed and ambulation.  

## 2015-06-07 ENCOUNTER — Encounter: Payer: Self-pay | Admitting: Pain Medicine

## 2015-06-07 ENCOUNTER — Ambulatory Visit: Payer: Self-pay | Attending: Pain Medicine | Admitting: Pain Medicine

## 2015-06-07 ENCOUNTER — Other Ambulatory Visit: Payer: Self-pay | Admitting: Family Medicine

## 2015-06-07 VITALS — BP 127/86 | HR 87 | Temp 97.9°F | Resp 16 | Ht 73.0 in | Wt 218.0 lb

## 2015-06-07 DIAGNOSIS — M47816 Spondylosis without myelopathy or radiculopathy, lumbar region: Secondary | ICD-10-CM | POA: Insufficient documentation

## 2015-06-07 DIAGNOSIS — M47812 Spondylosis without myelopathy or radiculopathy, cervical region: Secondary | ICD-10-CM | POA: Insufficient documentation

## 2015-06-07 DIAGNOSIS — M19049 Primary osteoarthritis, unspecified hand: Secondary | ICD-10-CM | POA: Insufficient documentation

## 2015-06-07 DIAGNOSIS — M5136 Other intervertebral disc degeneration, lumbar region: Secondary | ICD-10-CM | POA: Insufficient documentation

## 2015-06-07 DIAGNOSIS — G5603 Carpal tunnel syndrome, bilateral upper limbs: Secondary | ICD-10-CM

## 2015-06-07 DIAGNOSIS — M5030 Other cervical disc degeneration, unspecified cervical region: Secondary | ICD-10-CM

## 2015-06-07 DIAGNOSIS — Z9889 Other specified postprocedural states: Secondary | ICD-10-CM | POA: Insufficient documentation

## 2015-06-07 DIAGNOSIS — G5600 Carpal tunnel syndrome, unspecified upper limb: Secondary | ICD-10-CM | POA: Insufficient documentation

## 2015-06-07 MED ORDER — DICLOFENAC SODIUM 50 MG PO TBEC: DELAYED_RELEASE_TABLET | ORAL | Status: DC

## 2015-06-07 MED ORDER — OXYCODONE HCL 10 MG PO TABS: ORAL_TABLET | ORAL | Status: DC

## 2015-06-07 MED ORDER — DICLOFENAC SODIUM 1 % TD GEL: TRANSDERMAL | Status: DC

## 2015-06-07 NOTE — Patient Instructions (Addendum)
PLAN   Continue present medication diclofenac and oxycodone and begin Voltaren  gel applications to the hand  F/U PCP Dr. Laroy AppleKrebs for evaliation of  BP and general medical  condition. Remember diclofenac  (Voltaren) also can increase your blood pressure. Limit diclofenac use as we discussed previously  F/U surgical evaluation as discussed  Rheumatological evaluation to be considered as discussed previously  Evaluation by hand surgeon as discussed. Recommend discuss shoulders as well when discuss hand with surgeon  [facility] We will delay at this time as discussed  F/U neurological evaluation. May consider PNCV EMG studies and other studies pending follow-up evaluations  May consider radiofrequency rhizolysis or intraspinal procedures pending response to present treatment and F/U evaluation . We will avoid such procedures at this time  Patient is to call pain management for any concerns regarding condition prior to scheduled return appointment

## 2015-06-07 NOTE — Progress Notes (Signed)
Safety precautions to be maintained throughout the outpatient stay will include: orient to surroundings, keep bed in low position, maintain call bell within reach at all times, provide assistance with transfer out of bed and ambulation.  

## 2015-06-07 NOTE — Progress Notes (Signed)
Subjective:    Patient ID: USBALDO PANNONE, male    DOB: 02-01-1966, 50 y.o.   MRN: [id number]  HPI  The patient is a 50 year old gentleman who returns to pain management for further evaluation and treatment of pain involving neck entire back upper and lower extremity region. Patient continues medications consisting of oxycodone acetaminophen and Voltaren. The patient is having Increased pain of the right hand and pain of the neck upper mid lower back and lower extremity region. We have discussed interventional treatment which patient avoid since patient is without insurance coverage. The patient will undergo evaluation of the hand with Dr. Butler Denmark as discussed. The patient states that the pain of the back is aggravated by standing walking twisting turning maneuvers. The patient is exercising and massaging the area which has persisted despite treatment. We will remain available to consider modifications of treatment pending follow-up evaluation. Patient's call pain management prior to scheduled return appointment for change in condition or other concerns regarding condition prior to scheduled return appointment all agreed to suggested treatment plan      Review of Systems     Objective:   Physical Exam  There was tenderness to palpation of the splenius capitis and occipitalis musculature region a moderate degree with limited range of motion of the cervical spine with unremarkable Spurling's maneuver palpation over the thoracic facet thoracic paraspinal musculature region was attends to palpation of moderate degree with no crepitus of the thoracic region noted. Tinel and Phalen's maneuver were without increase of pain of significant degree. There is severe tenderness to palpation of the right hand with summary with no increased warmth and erythema in the region of the hand. There was severe tenderness to palpation base of the thumb and the carpal metacarpal region of the thumb. There was  tenderness of the medial and lateral epicondyles of the elbows. Palpation over the thoracic facet thoracic paraspinal musculature region was attends to palpation of moderate degree with moderate muscle spasm with palpation over the lumbar paraspinal musculatures and lumbar facet region reproducing moderate discomfort as well. A limited to approximately 20 without a definite increased pain with dorsiflexion noted. Palpation over the PSIS and PII S region reproduced moderate discomfort. No sensory deficit or dermatomal distribution detected. Lateral bending rotation extension and palpation of the lumbar facets reproduce severe discomfort. EHL strength was decreased. No definite sensory deficit or dermatomal distribution detected. Abdomen nontender with no costovertebral angle tenderness noted        Assessment & Plan:   Degenerative disc disease lumbar spine Multilevel lumbar degenerative changes Status post lumbar surgery  Lumbar facet syndrome  Degenerative changes cervical spine C5 and C5-6 degenerative changes especially  Cervical facet syndrome  Carpal tunnel syndrome  Degenerative joint disease of hand     PLAN   Continue present medication diclofenac and oxycodone and begin Voltaren  gel applications to the hand  F/U PCP Dr. Laroy Apple for evaliation of  BP and general medical  condition. Remember diclofenac  (Voltaren) also can increase your blood pressure. Limit diclofenac use as we discussed previously  F/U surgical evaluation as discussed  Rheumatological evaluation to be considered as discussed previously  Evaluation by hand surgeon as discussed. Recommend discuss shoulders as well when discuss hand with surgeon  Appointment at [facility] We will delay at this time as discussed  F/U neurological evaluation. May consider PNCV EMG studies and other studies pending follow-up evaluations  May consider radiofrequency rhizolysis or intraspinal procedures pending  response  to present treatment and F/U evaluation . We will avoid such procedures at this time  Patient is to call pain management for any concerns regarding condition prior to scheduled return appointment

## 2015-07-05 ENCOUNTER — Ambulatory Visit: Payer: Self-pay | Attending: Pain Medicine | Admitting: Pain Medicine

## 2015-07-05 ENCOUNTER — Encounter: Payer: Self-pay | Admitting: Pain Medicine

## 2015-07-05 VITALS — BP 152/97 | HR 63 | Temp 97.9°F | Resp 16 | Ht 73.0 in | Wt 220.0 lb

## 2015-07-05 DIAGNOSIS — M5030 Other cervical disc degeneration, unspecified cervical region: Secondary | ICD-10-CM

## 2015-07-05 DIAGNOSIS — G5603 Carpal tunnel syndrome, bilateral upper limbs: Secondary | ICD-10-CM

## 2015-07-05 DIAGNOSIS — M19049 Primary osteoarthritis, unspecified hand: Secondary | ICD-10-CM | POA: Insufficient documentation

## 2015-07-05 DIAGNOSIS — M47816 Spondylosis without myelopathy or radiculopathy, lumbar region: Secondary | ICD-10-CM | POA: Insufficient documentation

## 2015-07-05 DIAGNOSIS — Z9889 Other specified postprocedural states: Secondary | ICD-10-CM | POA: Insufficient documentation

## 2015-07-05 DIAGNOSIS — M5136 Other intervertebral disc degeneration, lumbar region: Secondary | ICD-10-CM | POA: Insufficient documentation

## 2015-07-05 DIAGNOSIS — M47812 Spondylosis without myelopathy or radiculopathy, cervical region: Secondary | ICD-10-CM | POA: Insufficient documentation

## 2015-07-05 DIAGNOSIS — G5600 Carpal tunnel syndrome, unspecified upper limb: Secondary | ICD-10-CM | POA: Insufficient documentation

## 2015-07-05 DIAGNOSIS — M62830 Muscle spasm of back: Secondary | ICD-10-CM | POA: Insufficient documentation

## 2015-07-05 MED ORDER — DICLOFENAC SODIUM 1 % TD GEL: TRANSDERMAL | Status: DC

## 2015-07-05 MED ORDER — DICLOFENAC SODIUM 50 MG PO TBEC: DELAYED_RELEASE_TABLET | ORAL | Status: DC

## 2015-07-05 MED ORDER — OXYCODONE HCL 10 MG PO TABS: ORAL_TABLET | ORAL | Status: DC

## 2015-07-05 NOTE — Patient Instructions (Signed)
PLAN   Continue present medication diclofenac and oxycodone and begin Voltaren  gel applications to the hand  F/U PCP Dr. Laroy AppleKrebs for evaliation of  BP and general medical  condition. Remember diclofenac  (Voltaren) also can increase your blood pressure. Limit diclofenac use as we discussed previously  F/U surgical evaluation as discussed  Rheumatological evaluation to be considered as discussed previously  Evaluation by hand surgeon as discussed. Recommend discuss shoulders as well when discuss hand with surgeon  [facility] We will delay at this time as discussed as previously discussed  F/U neurological evaluation. May consider PNCV EMG studies and other studies pending follow-up evaluations  May consider radiofrequency rhizolysis or intraspinal procedures pending response to present treatment and F/U evaluation . We will avoid such procedures at this time  Patient is to call pain management for any concerns regarding condition prior to scheduled return appointment

## 2015-07-05 NOTE — Progress Notes (Signed)
Subjective:    Patient ID: Gary Schultz, male    DOB: 10/05/1965, 50 y.o.   MRN: [id number]  HPI  The patient is a 50 year old gentleman who returns to pain management for further evaluation and treatment of pain involving the neck upper extremity regions mid and lower back and lower extremity regions. The patient is with pain involving the upper extremity region especially the region of the right hand and especially the region of the base of the thumb. The patient is with lower back lower extremity pain with prior surgical intervention of the lumbar region with persistent pain despite prior treatment. At the present time we awaiting patient to obtain insurance approval to be able to proceed with more treatment of the patient at. At the present time the patient is unable to undergo interventional treatment and is unable to undergo additional evaluations by other specialist due to lack of insurance. We have discussed patient's condition and will continue present medications including diclofenac and oxycodone. There also discussed the use of Voltaren gel. At the present time patient states the pain is aggravated by standing walking and the patient has limited use of the upper extremities especially the right upper extremity due to pain involving the region of the right thumb. We discussed patient undergoing surgical evaluation and will also consider evaluation of patient at tertiary pain clinic.. The patient has preferred to avoid evaluation at a tertiary pain clinic due to lack of insurance as well. We will continue present medications and we'll remain available to consider modification of treatment pending follow-up evaluation. Patient denies any trauma change in events of daily living the call significant change in symptomatology      Review of Systems     Objective:   Physical Exam  There was tenderness of the splenius capitis and occipitalis musculature regions of mild to moderate degree  with limited range of motion of the cervical spine. There was tenderness of the trapezius levator scapula and rhomboid musculature regions of moderate degree with palpation over the acromioclavicular and glenohumeral joint regions reproducing moderate discomfort as well the cervical facet cervical paraspinal musculature as well as the thoracic facet thoracic paraspinal musculature region was associated with moderate discomfort. There was unremarkable Spurling's maneuver. There was significantly decreased grip strength of the right upper extremity compared to the left upper extremity with increased sensitivity to touch of the metacarpophalangeal joint of the right thumb especially. No new deformed lesions of skin were noted. Tinel and Phalen's maneuver associated with moderate discomfort. There was tenderness over the thoracic facet thoracic paraspinal musculature region a moderate degree without crepitus of the thoracic region noted. There was significant muscle spasms noted in the thoracic region. Palpation over the lumbar paraspinal must reason lumbar facet region was associated with severe increased pain with lateral bending rotation extension and palpation of the lumbar facets reproducing moderately severe discomfort. There was tenderness of the PSIS and PII S region as well as the gluteal and piriformis musculature regions. Straight leg raise was tolerates approximately 20 without a definite increase of pain with dorsiflexion noted. There was tenderness of the knee with negative anterior and posterior drawer signs with no obvious ballottement of the patella noted there was crepitus of the knee increased pain with range of motion maneuvers of the knee there was negative clonus negative Homans. Abdomen nontender and no costovertebral tenderness noted      Assessment & Plan:      Degenerative disc disease lumbar spine Multilevel lumbar degenerative  changes Status post lumbar surgery  Lumbar facet  syndrome  Degenerative changes cervical spine C5 and C5-6 degenerative changes especially  Cervical facet syndrome  Carpal tunnel syndrome  Degenerative joint disease of hand       PLAN   Continue present medication diclofenac and oxycodone and apply Voltaren  gel applications to the hand  F/U PCP Dr. Laroy Apple for evaliation of  BP and general medical  condition. Remember diclofenac  (Voltaren) also can increase your blood pressure. Limit diclofenac use as we discussed previously. Discuss use of Voltaren with Dr. Laroy Apple as we discussed  F/U surgical evaluation as discussed  Rheumatological evaluation to be considered as discussed previously  Evaluation by hand surgeon as discussed. Recommend discuss shoulders as well when discuss hand with surgeon  Appointment at [facility] We will delay at this time as discussed as previously discussed  F/U neurological evaluation. May consider PNCV EMG studies and other studies pending follow-up evaluations  May consider radiofrequency rhizolysis or intraspinal procedures pending response to present treatment and F/U evaluation . We will avoid such procedures at this time  Patient is to call pain management for any concerns regarding condition prior to scheduled return appointment

## 2015-07-05 NOTE — Progress Notes (Signed)
Safety precautions to be maintained throughout the outpatient stay will include: orient to surroundings, keep bed in low position, maintain call bell within reach at all times, provide assistance with transfer out of bed and ambulation.  

## 2015-07-11 ENCOUNTER — Other Ambulatory Visit: Payer: Self-pay | Admitting: Family Medicine

## 2015-07-24 ENCOUNTER — Ambulatory Visit: Payer: Self-pay | Attending: Pain Medicine | Admitting: Pain Medicine

## 2015-07-24 ENCOUNTER — Encounter: Payer: Self-pay | Admitting: Pain Medicine

## 2015-07-24 VITALS — BP 141/81 | HR 61 | Temp 98.1°F | Resp 15 | Ht 73.0 in | Wt 220.0 lb

## 2015-07-24 DIAGNOSIS — M19049 Primary osteoarthritis, unspecified hand: Secondary | ICD-10-CM | POA: Insufficient documentation

## 2015-07-24 DIAGNOSIS — M47816 Spondylosis without myelopathy or radiculopathy, lumbar region: Secondary | ICD-10-CM

## 2015-07-24 DIAGNOSIS — Z9889 Other specified postprocedural states: Secondary | ICD-10-CM | POA: Insufficient documentation

## 2015-07-24 DIAGNOSIS — M25561 Pain in right knee: Secondary | ICD-10-CM | POA: Insufficient documentation

## 2015-07-24 DIAGNOSIS — M25562 Pain in left knee: Secondary | ICD-10-CM | POA: Insufficient documentation

## 2015-07-24 DIAGNOSIS — M47812 Spondylosis without myelopathy or radiculopathy, cervical region: Secondary | ICD-10-CM

## 2015-07-24 DIAGNOSIS — G5600 Carpal tunnel syndrome, unspecified upper limb: Secondary | ICD-10-CM

## 2015-07-24 DIAGNOSIS — M19012 Primary osteoarthritis, left shoulder: Secondary | ICD-10-CM | POA: Insufficient documentation

## 2015-07-24 DIAGNOSIS — G5603 Carpal tunnel syndrome, bilateral upper limbs: Secondary | ICD-10-CM

## 2015-07-24 DIAGNOSIS — M51369 Other intervertebral disc degeneration, lumbar region without mention of lumbar back pain or lower extremity pain: Secondary | ICD-10-CM

## 2015-07-24 DIAGNOSIS — M5030 Other cervical disc degeneration, unspecified cervical region: Secondary | ICD-10-CM

## 2015-07-24 DIAGNOSIS — M19011 Primary osteoarthritis, right shoulder: Secondary | ICD-10-CM | POA: Insufficient documentation

## 2015-07-24 DIAGNOSIS — M5136 Other intervertebral disc degeneration, lumbar region: Secondary | ICD-10-CM | POA: Insufficient documentation

## 2015-07-24 MED ORDER — DICLOFENAC SODIUM 1 % TD GEL: TRANSDERMAL | Status: DC

## 2015-07-24 MED ORDER — DICLOFENAC SODIUM 50 MG PO TBEC: DELAYED_RELEASE_TABLET | ORAL | Status: DC

## 2015-07-24 MED ORDER — OXYCODONE HCL 10 MG PO TABS: ORAL_TABLET | ORAL | Status: DC

## 2015-07-24 NOTE — Progress Notes (Signed)
Subjective:    Patient ID: Gary Schultz, male    DOB: 10/05/1965, 50 y.o.   MRN: [id number]  HPI  The patient is a 50 year old, who returns to pain management for further evaluation and treatment of pain involving the neck entire back upper and lower extremity regions patient is a pain involving the right hand significant degree and discontinuing to work. Patient is with known degenerative changes of the lumbar and cervical region as well as degenerative joint disease involving hands and shoulders especially patient also is with pain involving the knees we have discussed patient's treatment regimen including further evaluations by neurosurgery, orthopedic surgery and Neurology. The patient is waiting until he can get his insurance status in order. At the present time we will avoid interventional treatment and patient is able to get his insurance status in order as discussed with patient. We will continue diclofenac and oxycodone and patient will continue Voltaren gel applications as well. Patient denies trauma change in events of daily living the call significant change in symptomatology. The patient's pain is aggravated by reaching and lifting pushing pulling gripping standing walking twisting turning maneuvers we will consider modifications of treatment regimen and further evaluations as discussed with patient. At the present time patient is awaiting obtaining insurance coverage. All agreed to suggested treatment plan    Review of Systems     Objective:   Physical Exam  There was tenderness to palpation of the splenius capitis and occipitalis regions palpation which be produced moderate discomfort with limited range of motion of the cervical spine with rotation and flexion extension all limited. The patient appeared to be with unremarkable Spurling's maneuver. There was tenderness over the cervical facet cervical paraspinal musculature region a moderate degree. Palpation of the  acromioclavicular and glenohumeral joint regions reproduced pain of mild to moderate discomfort. The patient was able to perform drop test with mild difficulty. The patient was with slightly decreased grip strength on the right compared to the left with moderate to moderately severe tenderness to palpation of the base of the right thumb. There was no increased warmth and erythema in the region of the joint are the hand. Tinel and Phalen's maneuver was associated with moderate discomfort. Palpation over the region of the thoracic region thoracic facet region was a tennis to palpation of moderate degree with moderate muscle spasm involving the thoracic musculature region. No crepitus of the thoracic region was noted. Palpation over the region of the lumbar paraspinal musculature region lumbar facet region was with tenderness to palpation of moderate degree as well with lateral bending rotation extension and palpation of the lumbar facets reproducing moderately severe discomfort on the left as well as on the right. Straight leg raising appeared to be tolerated to 30 there was tends to palpation of the knees with negative anterior and posterior drawer signs without ballottement of the patella and deep tendon reflexes at the knees appeared to be trace to 1+. EHL strength appeared to be equal. No definite sensory deficit or dermatomal dystrophy she was detected. There was negative clonus negative Homans. Palpation of the PSIS and PII S regions reproduce moderate discomfort as well. Abdomen nontender and no costovertebral tenderness noted      Assessment & Plan:     Degenerative disc disease lumbar spine Multilevel lumbar degenerative changes Status post lumbar surgery  Lumbar facet syndrome  Degenerative changes cervical spine C5 and C5-6 degenerative changes especially  Cervical facet syndrome  Carpal tunnel syndrome  Degenerative joint disease  of hand     PLAN   Continue present medication  diclofenac and oxycodone Continue Voltaren  gel applications to the hand  F/U PCP Dr. Laroy Apple for evaliation of  BP and general medical  condition. Remember diclofenac  (Voltaren) also can increase your blood pressure. Limit diclofenac use as we discussed previously  F/U surgical evaluation as discussed  Rheumatological evaluation to be considered as discussed previously  Evaluation by hand surgeon as discussed.   Appointment at [facility] We will delay at this time as discussed as previously discussed  F/U neurological evaluation. May consider PNCV EMG studies and other studies pending follow-up evaluations  May consider radiofrequency rhizolysis or intraspinal procedures pending response to present treatment and F/U evaluation . We will avoid such procedures at this time  Patient is to call pain management for any concerns regarding condition prior to scheduled return appointment

## 2015-07-24 NOTE — Patient Instructions (Addendum)
PLAN   Continue present medication diclofenac and oxycodone  Voltaren  gel applications to the hand  F/U PCP Dr. Laroy AppleKrebs for evaliation of  BP and general medical  condition. Remember diclofenac  (Voltaren) also can increase your blood pressure. Limit diclofenac use as we discussed previously  F/U surgical evaluation as discussed  Rheumatological evaluation to be considered as discussed previously  Evaluation by hand surgeon as discussed.   [facility] We will delay at this time as discussed as previously discussed  F/U neurological evaluation. May consider PNCV EMG studies and other studies pending follow-up evaluations  May consider radiofrequency rhizolysis or intraspinal procedures pending response to present treatment and F/U evaluation . We will avoid such procedures at this time  Patient is to call pain management for any concerns regarding condition prior to scheduled return appointment

## 2015-08-02 ENCOUNTER — Encounter: Payer: Self-pay | Admitting: Pain Medicine

## 2015-08-08 ENCOUNTER — Other Ambulatory Visit: Payer: Self-pay | Admitting: Family Medicine

## 2015-08-16 ENCOUNTER — Other Ambulatory Visit: Payer: Self-pay | Admitting: Family Medicine

## 2015-08-20 ENCOUNTER — Ambulatory Visit: Payer: Self-pay | Admitting: Family Medicine

## 2015-08-23 ENCOUNTER — Ambulatory Visit: Payer: Self-pay | Admitting: Pain Medicine

## 2015-08-23 ENCOUNTER — Ambulatory Visit (INDEPENDENT_AMBULATORY_CARE_PROVIDER_SITE_OTHER): Payer: Self-pay | Admitting: Family Medicine

## 2015-08-23 VITALS — BP 129/85 | HR 73 | Temp 98.5°F | Resp 16 | Ht 73.0 in | Wt 216.4 lb

## 2015-08-23 DIAGNOSIS — I10 Essential (primary) hypertension: Secondary | ICD-10-CM | POA: Insufficient documentation

## 2015-08-23 DIAGNOSIS — G8929 Other chronic pain: Secondary | ICD-10-CM | POA: Insufficient documentation

## 2015-08-23 DIAGNOSIS — M25569 Pain in unspecified knee: Secondary | ICD-10-CM | POA: Insufficient documentation

## 2015-08-23 DIAGNOSIS — M25562 Pain in left knee: Secondary | ICD-10-CM

## 2015-08-23 MED ORDER — LISINOPRIL 30 MG PO TABS: 30.0000 mg | ORAL_TABLET | Freq: Every day | ORAL | Status: DC

## 2015-08-23 NOTE — Progress Notes (Signed)
Subjective:    Patient ID: Gary Schultz, male    DOB: 03/27/1965, 50 y.o.   MRN: [id number]  HPI: Gary EmperorClifton L Freundlich is a 50 y.o. male presenting on 08/23/2015 for Hypertension   HPI  Pt presents for hypertension follow-up. Has changed eating habits. Is walking to help control. Is still seeing Dr. Arta Brucehrisp for pain. Using voltaren gel. Wearing knee brace to help with knee pain.  Taking 30mg  lisinopril daily. No chest pain, no SOB, no dizziness.  Taking baby ASA at home.   Past Medical History  Diagnosis Date  . Hypertension   . Anxiety   . Neuromuscular disorder (HCC)   . Carpal tunnel syndrome     bilateral  . Generalized headaches     from neck injury  . Varicose veins     Current Outpatient Prescriptions on File Prior to Visit  Medication Sig  . aspirin 81 MG tablet Take 81 mg by mouth daily.  . diclofenac (VOLTAREN) 50 MG EC tablet Limit 1 tab by mouth per day or twice a day if tolerated  . Oxycodone HCl 10 MG TABS Limited 3-6 tabs by mouth per day if tolerated   No current facility-administered medications on file prior to visit.    Review of Systems  Constitutional: Negative for fever and chills.  HENT: Negative.   Respiratory: Negative for chest tightness, shortness of breath and wheezing.   Cardiovascular: Negative for chest pain, palpitations and leg swelling.  Gastrointestinal: Negative for nausea, vomiting and abdominal pain.  Endocrine: Negative.   Genitourinary: Negative for dysuria, urgency, discharge, penile pain and testicular pain.  Musculoskeletal: Negative for back pain, joint swelling and arthralgias.  Skin: Negative.   Neurological: Negative for dizziness, weakness, numbness and headaches.  Psychiatric/Behavioral: Negative for sleep disturbance and dysphoric mood.   Per HPI unless specifically indicated above     Objective:    BP 129/85 mmHg  Pulse 73  Temp(Src) 98.5 F (36.9 C) (Oral)  Resp 16  Ht 6\' 1"  (1.854 m)  Wt 216 lb 6.4 oz  (98.158 kg)  BMI 28.56 kg/m2  Wt Readings from Last 3 Encounters:  08/23/15 216 lb 6.4 oz (98.158 kg)  07/24/15 220 lb (99.791 kg)  07/05/15 220 lb (99.791 kg)    Physical Exam  Constitutional: He is oriented to person, place, and time. He appears well-developed and well-nourished. No distress.  HENT:  Head: Normocephalic and atraumatic.  Neck: Neck supple. No thyromegaly present.  Cardiovascular: Normal rate, regular rhythm and normal heart sounds.  Exam reveals no gallop and no friction rub.   No murmur heard. Pulmonary/Chest: Effort normal and breath sounds normal. He has no wheezes.  Abdominal: Soft. Bowel sounds are normal. He exhibits no distension. There is no tenderness. There is no rebound.  Musculoskeletal: Normal range of motion. He exhibits no edema.       Left knee: He exhibits normal range of motion. Tenderness found. Patellar tendon tenderness noted.  Neurological: He is alert and oriented to person, place, and time. He has normal reflexes.  Skin: Skin is warm and dry. No rash noted. No erythema.  Psychiatric: He has a normal mood and affect. His behavior is normal. Thought content normal.   No results found for this or any previous visit.    Assessment & Plan:   Problem List Items Addressed This Visit      Cardiovascular and Mediastinum   Hypertension - Primary    Check CMP. Continue lisinopril 30mg  daily. Recheck 3  mos.       Relevant Medications   lisinopril (PRINIVIL,ZESTRIL) 30 MG tablet   Other Relevant Orders   Lipid Profile   Comprehensive metabolic panel     Other   Left knee pain    Continue volateran gel - recommend pt be seen by orthopedics when he has insurance.          Meds ordered this encounter  Medications  . lisinopril (PRINIVIL,ZESTRIL) 30 MG tablet    Sig: Take 1 tablet (30 mg total) by mouth daily.    Dispense:  30 tablet    Refill:  11    Pt needs appt for further refills.    Order Specific Question:  Supervising Provider     Answer:  Janeann ForehandHAWKINS JR, JAMES H [[id number]]      Follow up plan: Return in about 3 months (around 11/23/2015), or if symptoms worsen or fail to improve, for HTN.

## 2015-08-23 NOTE — Assessment & Plan Note (Signed)
Continue volateran gel - recommend pt be seen by orthopedics when he has insurance.

## 2015-08-23 NOTE — Assessment & Plan Note (Signed)
Check CMP. Continue lisinopril 30mg  daily. Recheck 3 mos.

## 2015-08-23 NOTE — Patient Instructions (Signed)
I would recommend calling [facility] when your Aflac kicks in to discuss your knee pain and hand issues.    Your goal blood pressure is 140/90. Work on low salt/sodium diet - goal <1.5gm (1,500mg ) per day. Eat a diet high in fruits/vegetables and whole grains.  Look into mediterranean and DASH diet. Goal activity is 12050min/wk of moderate intensity exercise.  This can be split into 30 minute chunks.  If you are not at this level, you can start with smaller 10-15 min increments and slowly build up activity. Look at www.heart.org for more resources  Please seek immediate medical attention at ER or Urgent Care if you develop: Chest pain, pressure or tightness. Shortness of breath accompanied by nausea or diaphoresis Visual changes Numbness or tingling on one side of the body Facial droop Altered mental status Or any concerning symptoms.

## 2015-08-24 ENCOUNTER — Ambulatory Visit: Payer: Self-pay | Attending: Pain Medicine | Admitting: Pain Medicine

## 2015-08-24 ENCOUNTER — Encounter: Payer: Self-pay | Admitting: Pain Medicine

## 2015-08-24 VITALS — BP 126/85 | HR 59 | Temp 98.0°F | Resp 16 | Ht 73.0 in | Wt 215.0 lb

## 2015-08-24 DIAGNOSIS — M5030 Other cervical disc degeneration, unspecified cervical region: Secondary | ICD-10-CM

## 2015-08-24 DIAGNOSIS — G5600 Carpal tunnel syndrome, unspecified upper limb: Secondary | ICD-10-CM | POA: Insufficient documentation

## 2015-08-24 DIAGNOSIS — M47816 Spondylosis without myelopathy or radiculopathy, lumbar region: Secondary | ICD-10-CM | POA: Insufficient documentation

## 2015-08-24 DIAGNOSIS — M19049 Primary osteoarthritis, unspecified hand: Secondary | ICD-10-CM | POA: Insufficient documentation

## 2015-08-24 DIAGNOSIS — G5603 Carpal tunnel syndrome, bilateral upper limbs: Secondary | ICD-10-CM

## 2015-08-24 DIAGNOSIS — M5136 Other intervertebral disc degeneration, lumbar region: Secondary | ICD-10-CM | POA: Insufficient documentation

## 2015-08-24 DIAGNOSIS — Z9889 Other specified postprocedural states: Secondary | ICD-10-CM | POA: Insufficient documentation

## 2015-08-24 DIAGNOSIS — M47812 Spondylosis without myelopathy or radiculopathy, cervical region: Secondary | ICD-10-CM | POA: Insufficient documentation

## 2015-08-24 MED ORDER — DICLOFENAC SODIUM 50 MG PO TBEC: DELAYED_RELEASE_TABLET | ORAL | Status: DC

## 2015-08-24 MED ORDER — OXYCODONE HCL 10 MG PO TABS: ORAL_TABLET | ORAL | Status: DC

## 2015-08-24 NOTE — Patient Instructions (Addendum)
PLAN   Continue present medication diclofenac and oxycodone  Apply  Voltaren  gel applications to the hand  F/U PCP Dr. Krebs for evaliation of  BP and general medical  condition. Remember diclofenac  (Voltaren) also can increase your blood pressure. Limit diclofenac use as we discussed previously  F/U surgical evaluation as discussed  Rheumatological evaluation to be considered as discussed previously  Evaluation by hand surgeon as discussed. Area of hand surgeons are Dr. Grammig and Dr. Kuzma  Appointment at [facility] We will delay at this time as discussed as previously discussed  F/U neurological evaluation. May consider PNCV EMG studies and other studies pending follow-up evaluations. We will avoid such studies at this time  May consider radiofrequency rhizolysis or intraspinal procedures pending response to present treatment and F/U evaluation . We will avoid such procedures at this time  Patient is to call pain management for any concerns regarding condition prior to scheduled return appointment 

## 2015-08-24 NOTE — Progress Notes (Signed)
Patient here for medication management Safety precautions to be maintained throughout the outpatient stay will include: orient to surroundings, keep bed in low position, maintain call bell within reach at all times, provide assistance with transfer out of bed and ambulation.  

## 2015-08-24 NOTE — Progress Notes (Signed)
Subjective:    Patient ID: Gary Schultz, male    DOB: 03/23/1965, 50 y.o.   MRN: [id number]  HPI  The patient is a 50 year old gentleman who returns to pain management for further evaluation and treatment of pain involving the region of the neck upper extremity regions including the hands as well as the lower back lower extremity region. The patient continues to have significant pain involving the hands especially the right hand. The patient has discussed his condition with his primary care physician Dr.Krebs and the patient is considering evaluation by hand surgeon. We discussed patient's condition including medications and reminded patient that we had previously recommended that patient undergo evaluation by Dr.Gammig or by Dr. Merlyn LotKuzma for evaluation of pain of the hands. The patient also is with lower back lower extremity pain is undergone prior surgical intervention of the lumbar region is well. At the present time patient is without ability to undergo interventional treatment due to status of his insurance. We will continue medications as discussed and will consider patient for modification of treatment regimen pending follow-up evaluation. All agreed to suggested treatment plan.    Review of Systems     Objective:   Physical Exam  There was tenderness of the paraspinal musculature region cervical region cervical facet region with tenderness of the splenius capitis and occipitalis regions as well. There was increased pain with range of motion maneuvers of the cervical spine with no definite radiation of pain toward the upper extremities with range of motion maneuvers of the cervical spine. There was tenderness of the acromioclavicular and glenohumeral joint regions a moderate degree and patient was with decreased grip strength especially on the right compared to the left severe tenderness to palpation the base of the right thumb with no increased warmth or erythema noted. Tinel and Phalen's  maneuver was associated with increased pain of moderate degree. There was tenderness over the lumbar paraspinal must reason and thoracic region of moderate to moderately severe degree without crepitus of the thoracic region noted. Lateral bending rotation extension and palpation of the lumbar facets reproduced moderately severe discomfort. Straight leg raising was limited to approximately 20 without increased pain with dorsiflexion noted. DTRs were difficult to this patient had difficulty relaxing. There was negative clonus negative Homans. Abdomen nontender with no costovertebral tenderness noted       Assessment & Plan:      Degenerative disc disease lumbar spine Multilevel lumbar degenerative changes Status post lumbar surgery  Lumbar facet syndrome  Degenerative changes cervical spine C5 and C5-6 degenerative changes especially  Cervical facet syndrome  Carpal tunnel syndrome  Degenerative joint disease of hand     PLAN   Continue present medications diclofenac and oxycodone  Apply  Voltaren  gel applications to the handsAs discussed   F/U PCP Dr. Laroy AppleKrebs for evaliation of  BP and general medical  condition. Remember diclofenac  (Voltaren) also can increase your blood pressure. Limit diclofenac use as we discussed previously  F/U surgical evaluation as discussed  Rheumatological evaluation to be considered as discussed previously  Evaluation by hand surgeon as discussed. Hand surgeons are Dr. Butler DenmarkGrammig and Dr. Merlyn LotKuzma  [facility] We will delay at this time as discussed as previously discussed  F/U neurological evaluation. May consider PNCV EMG studies and other studies pending follow-up evaluations. We will avoid such studies at this time  May consider radiofrequency rhizolysis or intraspinal procedures pending response to present treatment and F/U evaluation . We will  avoid such procedures at this time  Patient is to call pain management for any  concerns regarding condition prior to scheduled return appointment

## 2015-09-25 ENCOUNTER — Ambulatory Visit: Payer: Self-pay | Attending: Pain Medicine | Admitting: Pain Medicine

## 2015-09-25 ENCOUNTER — Other Ambulatory Visit
Admission: RE | Admit: 2015-09-25 | Discharge: 2015-09-25 | Disposition: A | Discharge status: Home / Self Care | Payer: Self-pay | Source: Ambulatory Visit | Attending: Family Medicine | Admitting: Family Medicine

## 2015-09-25 ENCOUNTER — Encounter: Payer: Self-pay | Admitting: Pain Medicine

## 2015-09-25 VITALS — BP 126/80 | HR 70 | Temp 98.4°F | Resp 16 | Ht 73.0 in | Wt 210.0 lb

## 2015-09-25 DIAGNOSIS — M19049 Primary osteoarthritis, unspecified hand: Secondary | ICD-10-CM | POA: Insufficient documentation

## 2015-09-25 DIAGNOSIS — M5136 Other intervertebral disc degeneration, lumbar region: Secondary | ICD-10-CM | POA: Insufficient documentation

## 2015-09-25 DIAGNOSIS — Z9889 Other specified postprocedural states: Secondary | ICD-10-CM | POA: Insufficient documentation

## 2015-09-25 DIAGNOSIS — M47816 Spondylosis without myelopathy or radiculopathy, lumbar region: Secondary | ICD-10-CM | POA: Insufficient documentation

## 2015-09-25 DIAGNOSIS — M47812 Spondylosis without myelopathy or radiculopathy, cervical region: Secondary | ICD-10-CM | POA: Insufficient documentation

## 2015-09-25 DIAGNOSIS — G5600 Carpal tunnel syndrome, unspecified upper limb: Secondary | ICD-10-CM | POA: Insufficient documentation

## 2015-09-25 DIAGNOSIS — I10 Essential (primary) hypertension: Secondary | ICD-10-CM | POA: Insufficient documentation

## 2015-09-25 DIAGNOSIS — M62830 Muscle spasm of back: Secondary | ICD-10-CM | POA: Insufficient documentation

## 2015-09-25 LAB — COMPREHENSIVE METABOLIC PANEL
ALT: 31 U/L (ref 17–63)
AST: 22 U/L (ref 15–41)
Albumin: 4.4 g/dL (ref 3.5–5.0)
Alkaline Phosphatase: 71 U/L (ref 38–126)
Anion gap: 7 (ref 5–15)
BUN: 20 mg/dL (ref 6–20)
CO2: 25 mmol/L (ref 22–32)
Calcium: 9.3 mg/dL (ref 8.9–10.3)
Chloride: 105 mmol/L (ref 101–111)
Creatinine, Ser: 0.99 mg/dL (ref 0.61–1.24)
GFR calc Af Amer: >60 mL/min (ref >60)
GFR calc non Af Amer: >60 mL/min (ref >60)
Glucose, Bld: 126 mg/dL — ABNORMAL HIGH (ref 65–99)
Potassium: 4.3 mmol/L (ref 3.5–5.1)
Sodium: 137 mmol/L (ref 135–145)
Total Bilirubin: 0.9 mg/dL (ref 0.3–1.2)
Total Protein: 7 g/dL (ref 6.5–8.1)

## 2015-09-25 LAB — LIPID PANEL
Cholesterol: 190 mg/dL (ref 0–200)
HDL: 47 mg/dL (ref >40)
LDL Cholesterol: 110 mg/dL — ABNORMAL HIGH (ref 0–99)
Total CHOL/HDL Ratio: 4 RATIO
Triglycerides: 163 mg/dL — ABNORMAL HIGH (ref <150)
VLDL: 33 mg/dL (ref 0–40)

## 2015-09-25 MED ORDER — OXYCODONE HCL 10 MG PO TABS: ORAL_TABLET | ORAL | 0 refills | Status: DC

## 2015-09-25 MED ORDER — DICLOFENAC SODIUM 50 MG PO TBEC: DELAYED_RELEASE_TABLET | ORAL | 0 refills | Status: DC

## 2015-09-25 NOTE — Progress Notes (Signed)
Safety precautions to be maintained throughout the outpatient stay will include: orient to surroundings, keep bed in low position, maintain call bell within reach at all times, provide assistance with transfer out of bed and ambulation.  

## 2015-09-25 NOTE — Progress Notes (Signed)
The patient is a 50 year old gentleman who returns to pain management for further evaluation and treatment of pain involving the neck upper extremity region shoulders upper mid lower back and lower extremity region. The patient continues to work and continues to have significant pain involving shoulders knees back and neck. We discussed patient's condition on today's visit and we will continue presently prescribed medications consisting of Voltaren and oxycodone. We've advised patient to follow up with surgeon regarding pain of the hand as well as shoulder knees and thoracic oh lumbar region. At the present time patient is applying for insurance and wishes to delay further evaluations until his insurance is in order. The patient denied any trauma change in events of daily living the call significant change in symptomatology. The patient states the pain is aggravated by activities of daily living including activities of work. We will remain available to consider patient for me modification of treatment regimen as well as interventional treatment pending follow-up evaluations and provided patient has obtained insurance as discussed. All agreed to suggested treatment plan.     Physical examination   There was tenderness of the splenius capitis and occipitalis region palpation which be produced moderate degree discomfort. There was limited range of motion of the cervical spine with forward appeared to be unremarkable Spurling's maneuver. Palpation of the acromioclavicular and glenohumeral joint regions reproduce severe discomfort. There was well-healed surgical scar of the right shoulder with decreased range of motion of the right shoulder.. The patient appeared to be with decreased grip strength with tenderness to palpation of base of the thumb of the right hand. Tinel and Phalen's maneuver associated with moderate discomfort. Palpation over the region of the thoracic region was attends to palpation of  moderate muscle spasm with no crepitus of the thoracic region noted. Palpation over the lumbar region was of increased pain of moderately severe degree with lateral bending rotation extension and palpation over the lumbar facets reproducing moderately severe discomfort. Palpation of the PSIS and PII S region reproduced moderate discomfort with mild tenderness of the greater trochanteric region iliotibial band region. Straight leg raise was limited to approximately 20 without increased pain with dorsiflexion noted. There was negative clonus negative Homans DTRs were difficult to elicit there was crepitus of the knees with negative anterior and posterior drawer signs without ballottement of the patella. Abdomen was nontender with no costovertebral tenderness noted.    Assessment      Degenerative disc disease lumbar spine Multilevel lumbar degenerative changes Status post lumbar surgery  Lumbar facet syndrome  Degenerative changes cervical spine C5 and C5-6 degenerative changes especially  Cervical facet syndrome  Carpal tunnel syndrome  Degenerative joint disease of hand      PLAN   Continue present medication diclofenac and oxycodone  Apply  Voltaren  gel applications to the hand  F/U PCP Dr. Laroy Apple for evaliation of  BP and general medical  condition. Remember diclofenac  (Voltaren) also can increase your blood pressure. Limit diclofenac use as we discussed previously  F/U surgical evaluation as discussed  Rheumatological evaluation to be considered as discussed previously  Evaluation by hand surgeon as discussed. Area of hand surgeons are Dr. Butler Denmark and Dr. Merlyn Lot  Appointment at [facility] We will delay at this time as discussed as previously discussed  F/U neurological evaluation. May consider PNCV EMG studies and other studies pending follow-up evaluations. We will avoid such studies at this time  May consider radiofrequency rhizolysis or intraspinal  procedures pending  response to present treatment and F/U evaluation . We will avoid such procedures at this time  Patient is to call pain management for any concerns regarding condition prior to scheduled return appointment

## 2015-09-25 NOTE — Patient Instructions (Signed)
PLAN   Continue present medication diclofenac and oxycodone  Apply  Voltaren  gel applications to the hand  F/U PCP Dr. Laroy AppleKrebs for evaliation of  BP and general medical  condition. Remember diclofenac  (Voltaren) also can increase your blood pressure. Limit diclofenac use as we discussed previously  F/U surgical evaluation as discussed  Rheumatological evaluation to be considered as discussed previously  Evaluation by hand surgeon as discussed. Area of hand surgeons are Dr. Butler DenmarkGrammig and Dr. Merlyn LotKuzma  [facility] We will delay at this time as discussed as previously discussed  F/U neurological evaluation. May consider PNCV EMG studies and other studies pending follow-up evaluations. We will avoid such studies at this time  May consider radiofrequency rhizolysis or intraspinal procedures pending response to present treatment and F/U evaluation . We will avoid such procedures at this time  Patient is to call pain management for any concerns regarding condition prior to scheduled return appointment

## 2015-10-24 ENCOUNTER — Encounter: Payer: Self-pay | Admitting: Pain Medicine

## 2015-10-24 ENCOUNTER — Ambulatory Visit: Payer: Self-pay | Attending: Pain Medicine | Admitting: Pain Medicine

## 2015-10-24 VITALS — BP 152/86 | HR 71 | Temp 98.6°F | Resp 18 | Ht 73.0 in | Wt 208.0 lb

## 2015-10-24 DIAGNOSIS — M47812 Spondylosis without myelopathy or radiculopathy, cervical region: Secondary | ICD-10-CM | POA: Insufficient documentation

## 2015-10-24 DIAGNOSIS — Z9889 Other specified postprocedural states: Secondary | ICD-10-CM | POA: Insufficient documentation

## 2015-10-24 DIAGNOSIS — M19049 Primary osteoarthritis, unspecified hand: Secondary | ICD-10-CM | POA: Insufficient documentation

## 2015-10-24 DIAGNOSIS — G5600 Carpal tunnel syndrome, unspecified upper limb: Secondary | ICD-10-CM | POA: Insufficient documentation

## 2015-10-24 DIAGNOSIS — M5136 Other intervertebral disc degeneration, lumbar region: Secondary | ICD-10-CM | POA: Insufficient documentation

## 2015-10-24 DIAGNOSIS — M47816 Spondylosis without myelopathy or radiculopathy, lumbar region: Secondary | ICD-10-CM | POA: Insufficient documentation

## 2015-10-24 MED ORDER — DICLOFENAC SODIUM 50 MG PO TBEC
DELAYED_RELEASE_TABLET | ORAL | 0 refills | Status: DC
Start: 2015-10-24 — End: 2015-12-26

## 2015-10-24 MED ORDER — OXYCODONE HCL 10 MG PO TABS: ORAL_TABLET | ORAL | 0 refills | Status: DC

## 2015-10-24 NOTE — Progress Notes (Signed)
The patient is a 50 year old gentleman who returns to pain management for further evaluation and treatment of pain involving the neck upper extremity regions lower back and lower extremity region with prior surgical intervention of the lumbar region. The patient is with significant pain involving the neck and upper extremity regions with significant pain occurring the region of the right hand involving the thumb as well as pain occurring in the left hand involving the digits of the hand. 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 We discussed patient's condition and patient is considering job where he will have to do S physical activity. 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 The patient admits to pain involving the hands with weakness occurring in the extremities at times. We discussed patient's condition and we will continue medications consisting of diclofenac and oxycodone. We will also discussed the use of Voltaren gel to be applied to the regions of the joints. The patient was with understanding and agreed with suggested treatment plan     Physical examination  There was moderate tenderness of the splenius capitis and occipitalis region. Patient was able to perform range of motion maneuvers with moderate difficulty with limited range of motion rotation and lateral bending. The patient was with slightly decreased grip strength of the right hand compared to the left hand without significant increased pain with Tinel and Phalen's maneuver. There was tenderness to palpation of the base of the thumb of the right hand with no increased warmth or erythema noted. There was tenderness over the acromioclavicular and glenohumeral joint region of moderate degree with limited range of motion of the shoulder. Tinel there was  tenderness of the thoracic region with out crepitus of the thoracic region with moderate to moderately severe muscle spasms involving the mid and lower thoracic regions. Palpation over the lumbar paraspinal musculature region lumbar facet region was with moderate tenderness to palpation palpation over the PSIS and PII S regions reproduced moderately severe discomfort. Straight leg raise was tolerates approximately 20 without a definite increase of pain with dorsiflexion noted. DTRs appeared to be trace at the knees there was tends to palpation of the knees and crepitus of the knees with negative anterior and posterior drawer signs without ballottement of the patella. Straight leg raise was tolerates approximately 20 without increased pain with dorsiflexion noted there was negative clonus negative Homans. Abdomen nontender no costovertebral tenderness noted     Assessment  Degenerative disc disease lumbar spine Multilevel lumbar degenerative changes Status post lumbar surgery  Lumbar facet syndrome  Degenerative changes cervical spine C5 and C5-6 degenerative changes especially  Cervical facet syndrome  Carpal tunnel syndrome  Degenerative joint disease of hand      PLAN   Continue present medication diclofenac and oxycodone  Apply  Voltaren  gel applications to the hand  F/U PCP Dr. Laroy Apple for evaliation of  BP and general medical  condition. Remember diclofenac  (Voltaren) also can increase your blood pressure. Limit diclofenac use as we discussed previously  F/U surgical evaluation as discussed  Rheumatological evaluation to be considered as discussed previously  Evaluation by hand surgeon as discussed. Hand surgeons are Dr. Butler Denmark and Dr. Merlyn Lot  Appointment at [facility] We will delay at this time as discussed as previously discussed  F/U neurological evaluation. May consider  PNCV EMG studies and other studies pending follow-up evaluations. We will avoid such  studies at this time  May consider radiofrequency rhizolysis or intraspinal procedures pending response to present treatment and F/U evaluation . We will avoid such procedures at this time  Patient is to call pain management for any concerns regarding condition prior to scheduled return appointment

## 2015-10-24 NOTE — Patient Instructions (Signed)
PLAN   Continue present medication diclofenac and oxycodone  Apply  Voltaren  gel applications to the hand  F/U PCP Dr. Laroy AppleKrebs for evaliation of  BP and general medical  condition. Remember diclofenac  (Voltaren) also can increase your blood pressure. Limit diclofenac use as we discussed previously  F/U surgical evaluation as discussed  Rheumatological evaluation to be considered as discussed previously  Evaluation by hand surgeon as discussed. Hand surgeons are Dr. Butler DenmarkGrammig and Dr. Merlyn LotKuzma  [facility] We will delay at this time as discussed as previously discussed  F/U neurological evaluation. May consider PNCV EMG studies and other studies pending follow-up evaluations. We will avoid such studies at this time  May consider radiofrequency rhizolysis or intraspinal procedures pending response to present treatment and F/U evaluation . We will avoid such procedures at this time  Patient is to call pain management for any concerns regarding condition prior to scheduled return appointment

## 2015-10-24 NOTE — Progress Notes (Signed)
Safety precautions to be maintained throughout the outpatient stay will include: orient to surroundings, keep bed in low position, maintain call bell within reach at all times, provide assistance with transfer out of bed and ambulation.  

## 2015-11-08 ENCOUNTER — Ambulatory Visit (INDEPENDENT_AMBULATORY_CARE_PROVIDER_SITE_OTHER): Payer: Self-pay | Admitting: Family Medicine

## 2015-11-08 ENCOUNTER — Ambulatory Visit
Admission: RE | Admit: 2015-11-08 | Discharge: 2015-11-08 | Disposition: A | Discharge status: Home / Self Care | Payer: Self-pay | Source: Ambulatory Visit | Attending: Family Medicine | Admitting: Family Medicine

## 2015-11-08 ENCOUNTER — Encounter: Payer: Self-pay | Admitting: Family Medicine

## 2015-11-08 VITALS — BP 131/84 | HR 77 | Temp 98.6°F | Resp 16 | Ht 73.0 in | Wt 213.4 lb

## 2015-11-08 DIAGNOSIS — M5412 Radiculopathy, cervical region: Secondary | ICD-10-CM

## 2015-11-08 DIAGNOSIS — M25562 Pain in left knee: Secondary | ICD-10-CM

## 2015-11-08 DIAGNOSIS — R200 Anesthesia of skin: Secondary | ICD-10-CM | POA: Insufficient documentation

## 2015-11-08 DIAGNOSIS — M5382 Other specified dorsopathies, cervical region: Secondary | ICD-10-CM

## 2015-11-08 DIAGNOSIS — R202 Paresthesia of skin: Secondary | ICD-10-CM

## 2015-11-08 DIAGNOSIS — M47812 Spondylosis without myelopathy or radiculopathy, cervical region: Secondary | ICD-10-CM

## 2015-11-08 MED ORDER — IBUPROFEN 600 MG PO TABS: 600.0000 mg | ORAL_TABLET | Freq: Three times a day (TID) | ORAL | 0 refills | Status: DC | PRN

## 2015-11-08 MED ORDER — PREDNISONE 10 MG PO TABS: ORAL_TABLET | ORAL | 0 refills | Status: DC

## 2015-11-08 NOTE — Patient Instructions (Addendum)
Knee pain: You probably need to see an orthopedist to have your knee drained. We will get an XR of your knee today.  Neck pain: We will try to some prednisone for the pain. Follow instructions on the bottle. We need to get an MRI of your neck. If you notice increasing numbness and tingling, severe pain, loss of function in arms or legs- head to ER. That could be a neurological emergency.

## 2015-11-08 NOTE — Progress Notes (Signed)
Subjective:    Patient ID: Gary Schultz, male    DOB: 01/10/1966, 50 y.o.   MRN: [id number]  HPI: Gary Schultz is a 50 y.o. male presenting on 11/08/2015 for Neck Pain (lower neck pain rediate to shoulder and down to hand and finger both side Right worst gets numbness was at work at pulled engine in garage noticed sharp pain and numbness more stabbing pain works as Curatormechanic)   HPI  Pt presents for pain in the hands. Holding vibrating ratchet- felt funny pain in neck and radiating down to hands- 2-3 weeks ago. Waking up at night from burning pain from neck to finger-tips. If he laid down on the neck/back pain occurs. "Feels as though someone is pulling tendons out to hands."  Pain radiates from neck to hands. Numbness shoots to fingertips- both sides. Worse on R- he is R hand dominant. He does have known carpal tunnel disease.  Has known cervical disc disease with last known mild cervical stenosis in MRI in 2012- is seeing Dr. Arta Brucehrisp for joint injections for cervical facet syndrome.   L knee pain- long standing issue- has joint effusions where it will swell occasionally. Twisted L knee on th ejob. Has been swollen and painful since he twisted it a few weeks ago at work. Using diclofenac gel to help with pain. Mild relief.   Past Medical History:  Diagnosis Date  . Anxiety   . Carpal tunnel syndrome    bilateral  . Generalized headaches    from neck injury  . Hypertension   . Neuromuscular disorder (HCC)   . Varicose veins     Current Outpatient Prescriptions on File Prior to Visit  Medication Sig  . aspirin 81 MG tablet Take 81 mg by mouth daily.  . diclofenac (VOLTAREN) 50 MG EC tablet Limit 1 tab by mouth per day or twice a day if tolerated  . lisinopril (PRINIVIL,ZESTRIL) 30 MG tablet Take 1 tablet (30 mg total) by mouth daily.  . Oxycodone HCl 10 MG TABS Limited 3-6 tabs by mouth per day if tolerated   No current facility-administered medications on file prior to visit.       Review of Systems  Constitutional: Negative for chills and fever.  HENT: Negative.   Respiratory: Negative for chest tightness, shortness of breath and wheezing.   Cardiovascular: Negative for chest pain, palpitations and leg swelling.  Gastrointestinal: Negative for abdominal pain, nausea and vomiting.  Endocrine: Negative.   Genitourinary: Negative for discharge, dysuria, penile pain, testicular pain and urgency.  Musculoskeletal: Positive for arthralgias, joint swelling, myalgias and neck pain. Negative for back pain.  Skin: Negative.   Neurological: Positive for numbness. Negative for dizziness, weakness and headaches.  Psychiatric/Behavioral: Negative for dysphoric mood and sleep disturbance.   Per HPI unless specifically indicated above     Objective:    BP 131/84   Pulse 77   Temp 98.6 F (37 C) (Oral)   Resp 16   Ht 6\' 1"  (1.854 m)   Wt 213 lb 6.4 oz (96.8 kg)   BMI 28.15 kg/m   Wt Readings from Last 3 Encounters:  11/08/15 213 lb 6.4 oz (96.8 kg)  10/24/15 208 lb (94.3 kg)  09/25/15 210 lb (95.3 kg)    Physical Exam  Neck: Neck supple. Spinous process tenderness and muscular tenderness present. No neck rigidity. No edema, no erythema and normal range of motion (Pain with ROM. ) present. No Brudzinski's sign and no Kernig's sign noted.  Pain at the spinous process at the base of the neck.   Musculoskeletal:       Right shoulder: He exhibits decreased range of motion (unable to internally rotate the arm r/t shoulder surgery in 1993. ) and decreased strength (decreased strength supraspinatous muscle- 2/2 surgery in 1993. ). He exhibits no tenderness, no bony tenderness, no swelling and no effusion.       Left shoulder: Normal.       Right hand: Decreased sensation noted.       Left hand: Decreased sensation noted.  Decreased sensation to monofilament.  Pain in the shoulders and arms are relieved with traction on the neck.    Results for orders placed or  performed during the hospital encounter of 09/25/15  Lipid panel  Result Value Ref Range   Cholesterol 190 0 - 200 mg/dL   Triglycerides 308 (H) <150 mg/dL   HDL 47 >65 mg/dL   Total CHOL/HDL Ratio 4.0 RATIO   VLDL 33 0 - 40 mg/dL   LDL Cholesterol 784 (H) 0 - 99 mg/dL  Comprehensive metabolic panel  Result Value Ref Range   Sodium 137 135 - 145 mmol/L   Potassium 4.3 3.5 - 5.1 mmol/L   Chloride 105 101 - 111 mmol/L   CO2 25 22 - 32 mmol/L   Glucose, Bld 126 (H) 65 - 99 mg/dL   BUN 20 6 - 20 mg/dL   Creatinine, Ser 6.96 0.61 - 1.24 mg/dL   Calcium 9.3 8.9 - 29.5 mg/dL   Total Protein 7.0 6.5 - 8.1 g/dL   Albumin 4.4 3.5 - 5.0 g/dL   AST 22 15 - 41 U/L   ALT 31 17 - 63 U/L   Alkaline Phosphatase 71 38 - 126 U/L   Total Bilirubin 0.9 0.3 - 1.2 mg/dL   GFR calc non Af Amer >60 >60 mL/min   GFR calc Af Amer >60 >60 mL/min   Anion gap 7 5 - 15      Assessment & Plan:   Problem List Items Addressed This Visit      Musculoskeletal and Integument   Cervical facet syndrome - Primary    Known cervical issues but increasing numbness and pain- will need updated MRI to determine if cervical stenosis has worsened.       Relevant Medications   ibuprofen (ADVIL,MOTRIN) 600 MG tablet   Other Relevant Orders   MR Cervical Spine Wo Contrast     Other   Lateral knee pain    Recommend pt see an orthopedist for joint injections. He can self refer as he is self pain. Continue diclofenac for mild pain. Ibuprofen for severe pain.       Relevant Orders   DG Knee Complete 4 Views Left (Completed)   Numbness and tingling in hands   Relevant Medications   predniSONE (DELTASONE) 10 MG tablet   Other Relevant Orders   MR Cervical Spine Wo Contrast    Other Visit Diagnoses    Cervical radiculopathy       Pain is likely cervical radiculopathy- trial of prednisone to help with pain. Update MRI 2/2 known cervical stenosis. Consider PT vs. neurosurgery referral pending MRI.    Relevant  Medications   predniSONE (DELTASONE) 10 MG tablet      Meds ordered this encounter  Medications  . predniSONE (DELTASONE) 10 MG tablet    Sig: Take 6 pills today, 5 pills day 2, 4 pills day 3, 3 pills day 4, 2 pills day  5, 1 pill day 6.    Dispense:  21 tablet    Refill:  0    Order Specific Question:   Supervising Provider    Answer:   Janeann Forehand [phone]  . ibuprofen (ADVIL,MOTRIN) 600 MG tablet    Sig: Take 1 tablet (600 mg total) by mouth every 8 (eight) hours as needed.    Dispense:  30 tablet    Refill:  0    Order Specific Question:   Supervising Provider    Answer:   Janeann Forehand [[id number]]      Follow up plan: Return in about 3 weeks (around 11/29/2015) for Knee pain, neck pain. Marland Kitchen

## 2015-11-08 NOTE — Assessment & Plan Note (Signed)
Known cervical issues but increasing numbness and pain- will need updated MRI to determine if cervical stenosis has worsened.

## 2015-11-08 NOTE — Assessment & Plan Note (Signed)
Recommend pt see an orthopedist for joint injections. He can self refer as he is self pain. Continue diclofenac for mild pain. Ibuprofen for severe pain.

## 2015-11-21 ENCOUNTER — Ambulatory Visit
Admission: RE | Admit: 2015-11-21 | Discharge: 2015-11-21 | Disposition: A | Discharge status: Home / Self Care | Payer: Self-pay | Source: Ambulatory Visit | Attending: Family Medicine | Admitting: Family Medicine

## 2015-11-21 DIAGNOSIS — M4802 Spinal stenosis, cervical region: Secondary | ICD-10-CM | POA: Insufficient documentation

## 2015-11-21 DIAGNOSIS — M50323 Other cervical disc degeneration at C6-C7 level: Secondary | ICD-10-CM | POA: Insufficient documentation

## 2015-11-21 DIAGNOSIS — R202 Paresthesia of skin: Secondary | ICD-10-CM | POA: Insufficient documentation

## 2015-11-21 DIAGNOSIS — M4682 Other specified inflammatory spondylopathies, cervical region: Secondary | ICD-10-CM | POA: Insufficient documentation

## 2015-11-22 ENCOUNTER — Other Ambulatory Visit: Payer: Self-pay | Admitting: Family Medicine

## 2015-11-22 DIAGNOSIS — R202 Paresthesia of skin: Secondary | ICD-10-CM

## 2015-11-22 DIAGNOSIS — M47812 Spondylosis without myelopathy or radiculopathy, cervical region: Secondary | ICD-10-CM

## 2015-11-22 DIAGNOSIS — R200 Anesthesia of skin: Secondary | ICD-10-CM

## 2015-11-22 DIAGNOSIS — M5412 Radiculopathy, cervical region: Secondary | ICD-10-CM

## 2015-12-06 ENCOUNTER — Telehealth: Payer: Self-pay | Admitting: Family Medicine

## 2015-12-06 NOTE — Telephone Encounter (Signed)
Rene KocherRegina at [facility] does not see the referral she spoke with you about yesterday.  She asked if you would refax to [phone] and specify which dr or first available,.  Her call back number is [phone]

## 2015-12-06 NOTE — Telephone Encounter (Signed)
Referral refaxed.[place]

## 2015-12-23 ENCOUNTER — Other Ambulatory Visit: Payer: Self-pay | Admitting: Pain Medicine

## 2015-12-26 ENCOUNTER — Ambulatory Visit (INDEPENDENT_AMBULATORY_CARE_PROVIDER_SITE_OTHER): Payer: Self-pay | Admitting: Family Medicine

## 2015-12-26 ENCOUNTER — Encounter: Payer: Self-pay | Admitting: Family Medicine

## 2015-12-26 VITALS — BP 125/81 | HR 74 | Temp 98.2°F | Resp 16 | Ht 71.0 in | Wt 213.0 lb

## 2015-12-26 DIAGNOSIS — M25562 Pain in left knee: Secondary | ICD-10-CM

## 2015-12-26 DIAGNOSIS — L03114 Cellulitis of left upper limb: Secondary | ICD-10-CM

## 2015-12-26 DIAGNOSIS — M23207 Derangement of unspecified meniscus due to old tear or injury, left knee: Secondary | ICD-10-CM

## 2015-12-26 DIAGNOSIS — S83209A Unspecified tear of unspecified meniscus, current injury, unspecified knee, initial encounter: Secondary | ICD-10-CM | POA: Insufficient documentation

## 2015-12-26 MED ORDER — METHYLPREDNISOLONE ACETATE 40 MG/ML IJ SUSP
40.0000 mg | Freq: Once | INTRAMUSCULAR | Status: AC
  Administered 2015-12-26: 40 mg via INTRA_ARTICULAR

## 2015-12-26 MED ORDER — SULFAMETHOXAZOLE-TRIMETHOPRIM 800-160 MG PO TABS: 2.0000 | ORAL_TABLET | Freq: Two times a day (BID) | ORAL | 0 refills | Status: AC

## 2015-12-26 MED ORDER — SULFAMETHOXAZOLE-TRIMETHOPRIM 800-160 MG PO TABS: 2.0000 | ORAL_TABLET | Freq: Two times a day (BID) | ORAL | 0 refills | Status: DC

## 2015-12-26 MED ORDER — NAPROXEN 500 MG PO TABS: 500.0000 mg | ORAL_TABLET | Freq: Two times a day (BID) | ORAL | 0 refills | Status: DC

## 2015-12-26 MED ORDER — LIDOCAINE HCL (PF) 1 % IJ SOLN
4.0000 mL | Freq: Once | INTRAMUSCULAR | Status: AC
  Administered 2015-12-26: 4 mL

## 2015-12-26 NOTE — Progress Notes (Signed)
Subjective:    Patient ID: Gary EmperorClifton L Wetsel, male    DOB: 09/18/1965, 50 y.o.   MRN: [id number]  Gary Schultz is a 50 y.o. male presenting on 12/26/2015 for Knee Pain (left)   HPI   LEFT KNEE PAIN, Lateral, Chronic / H/o old meniscus injury (non operative) - Patient presents today to follow-up on Left knee pain since [facility], when he suffered a twisting L knee injury with acute pain, injury with a slip at work and he caught himself without fall or trauma, had swelling without bruising initially. At last visit, he had knee x-rays without any significant DJD or fracture, treated with Prednisone 6d taper and ibuprofen 600mg , he significantly improved on prednisone but then pain worsened once he finished it, he has tried to wear knee brace at work, took ibuprofen 2+ weeks, had some swelling still and persistent pain. - Reports prior history of water ski acute twisting left knee injury 18 years ago, he had MRI with meniscus injury, did not require surgery, gradual improvement, and he has done well over past 18 years, occasional painful flare ups but nothing major and no new injuries until recently - Currently pain is worsening in Left knee. He describes pain as constant deep aching moderate to severe pain (>6/10) when standing, when resting without pressure/standing mild constant pain, limited ROM unable to fully extend or flex. Occasional mechanical symptoms with locking and seems to get stuck in certain position. - Using ice pack at night for swelling. Also has inversion table for his back, with improvement of knee swelling - He states work is difficult and he will try to do less hands on, requesting light duty note for rest of week  CELLULITIS, Left FOREARM: - Reports acute onset 1 week ago had left inner forearm pustule with some redness, thought maybe bug bite, it drained some pus and he squeezed it with drainage of thicker purulent material, this spot continued to heal,  few days later over past 1 week he developed now redness and swelling about 1-2 inches away from original spot, similar appearance. - He took some old antibiotics with Amoxicillin 500mg  twice a day for 2-3 days with improvement reduced redness - Denies any drainage of pus or bleeding / opening of current site of infection, spreading redness, fevers/chills sweats nausea, vomiting   Social History  Substance Use Topics  . Smoking status: Never Smoker  . Smokeless tobacco: Never Used  . Alcohol use No    Review of Systems Per HPI unless specifically indicated above     Objective:    BP 125/81 (BP Location: Left Arm, Patient Position: Sitting, Cuff Size: Large)   Pulse 74   Temp 98.2 F (36.8 C) (Oral)   Resp 16   Ht 5\' 11"  (1.803 m)   Wt 213 lb (96.6 kg)   BMI 29.71 kg/m   Wt Readings from Last 3 Encounters:  12/26/15 213 lb (96.6 kg)  11/08/15 213 lb 6.4 oz (96.8 kg)  10/24/15 208 lb (94.3 kg)    Physical Exam  Constitutional: He appears well-developed and well-nourished. No distress.  Well-appearing, mild discomfort with left knee, cooperative  HENT:  Head: Normocephalic and atraumatic.  Mouth/Throat: Oropharynx is clear and moist.  Cardiovascular: Normal rate and intact distal pulses.   Pulmonary/Chest: Effort normal.  Musculoskeletal:  Left Knees Inspection: Left knee bulky appearance. No ecchymosis, some mild soft tissue edema laterally and superiorly. No erythema. Palpation: Mild +TTP Left knee only  lateral joint line and superior lateral muscles. +crepitus with ROM ROM: Limited ROM, unable to do complete extension due to pain and tightness, and mostly intact flexion with some pain Special Testing: Lachman test negative without obvious ACL laxity, difficulty with LCL testing due to pain. McMurray positive with lateral meniscus symptoms of pain and popping. Standing Thessaly positive Left mensicus laterally. Strength: 5/5 intact knee flex/ext, ankle  dorsi/plantarflex Neurovascular: distally intact sensation light touch and pulses  Neurological: He is alert.  Skin: Skin is warm and dry. He is not diaphoretic. There is erythema (Left inner forearm proximal - central area of 2 x 2 cm firm induration without fluctuance localized erythema and warmth, minimal tender, central pore without ulceration or opening, no drainage of pus.).  Nearby small scabbed over healing pustule, without induration or drainage, since drained.  Nursing note and vitals reviewed.    ________________________________________________________ PROCEDURE NOTE Date: 12/26/15 Left Knee corticosteroid injection Discussed benefits and risks (including pain, bleeding, infection, steroid flare). Verbal consent given by patient. Medication:  1 cc Depo-medrol 40mg  and 4 cc Lidocaine 1% without epi Time Out taken  Landmarks identified. Area cleansed with alcohol wipes.Using 21 gauge and 1, 1/2 inch needle, Left knee joint space was injected (with above listed medication) via lateral approach cold spray used for superficial anesthetic.Sterile bandage placed.Patient tolerated procedure well without bleeding or paresthesias.No complications.  -------------------------------------------------   Left inner forearm \\    I have personally reviewed the radiology report from Left Knee X-rays on 11/08/15.  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 EXAM: LEFT KNEE - COMPLETE 4+ VIEW COMPARISON:  None. FINDINGS: No acute bony abnormality. Specifically, no fracture, subluxation, or dislocation. Soft tissues are intact. Joint spaces are maintained. No joint effusion. IMPRESSION: No acute bony abnormality.   I have personally reviewed the following lab results from 09/25/15.  Results for orders placed or performed during the hospital encounter of 09/25/15  Lipid panel  Result Value Ref Range   Cholesterol 190 0 - 200 mg/dL   Triglycerides  161163 (H) <150 mg/dL   HDL 47 >09>40 mg/dL   Total CHOL/HDL Ratio 4.0 RATIO   VLDL 33 0 - 40 mg/dL   LDL Cholesterol 604110 (H) 0 - 99 mg/dL  Comprehensive metabolic panel  Result Value Ref Range   Sodium 137 135 - 145 mmol/L   Potassium 4.3 3.5 - 5.1 mmol/L   Chloride 105 101 - 111 mmol/L   CO2 25 22 - 32 mmol/L   Glucose, Bld 126 (H) 65 - 99 mg/dL   BUN 20 6 - 20 mg/dL   Creatinine, Ser 5.400.99 0.61 - 1.24 mg/dL   Calcium 9.3 8.9 - 98.110.3 mg/dL   Total Protein 7.0 6.5 - 8.1 g/dL   Albumin 4.4 3.5 - 5.0 g/dL   AST 22 15 - 41 U/L   ALT 31 17 - 63 U/L   Alkaline Phosphatase 71 38 - 126 U/L   Total Bilirubin 0.9 0.3 - 1.2 mg/dL   GFR calc non Af Amer >60 >60 mL/min   GFR calc Af Amer >60 >60 mL/min   Anion gap 7 5 - 15      Assessment & Plan:   Problem List Items Addressed This Visit    Tear of meniscus of knee joint    Suspected mensicus injury given nature of twisting injury, 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 Exam with positive McMurray and Thessaly - L knee  steroid injection today - referral to [facility], will need advanced imaging and evaluation/management      Relevant Medications   methylPREDNISolone acetate (DEPO-MEDROL) injection 40 mg (Completed)   lidocaine (PF) (XYLOCAINE) 1 % injection 4 mL (Completed)   naproxen (NAPROSYN) 500 MG tablet   Other Relevant Orders   Ambulatory referral to Orthopedics   Lateral knee pain - Primary    Subacute on chronic left knee lateral pain following recent acute twist injury 10/2015, given history of old acute L knee meniscus injury >18 years ago, concern for re-injury especially with recurrent swelling and some instability / mechanical symptoms. - Recent X-ray 9/21 without DJD or acute findings  Plan: 1. Received L knee steroid injection today, see procedure note - given prior improvement on prednisone oral, and limited improvement with conservative therapy over past 2 months 2. Referral to [facility] for further evaluation, likely will need advanced imaging MRI vs surgical / arthroscopic evaluation 3. Start Naproxen 500mg  BID wc for 2-4 weeks then PRN 4. Written work note for [employer] starting today through rest of week, will return to regular duty on Monday 11/13 5. Continue knee brace, RICE therapy 6. Follow-up as needed      Relevant Medications   methylPREDNISolone acetate (DEPO-MEDROL) injection 40 mg (Completed)   lidocaine (PF) (XYLOCAINE) 1 % injection 4 mL (Completed)   naproxen (NAPROSYN) 500 MG tablet   Other Relevant Orders   Ambulatory referral to Orthopedics   Cellulitis of left upper extremity    Secondary to recent bug bite or scratch with initial purulent drainage. Now secondary cellulitis, minimal abscess. Cover for MRSA.  Plan: 1. Marked erythema border today 2. Bactrim DS x 2 pills BID 7d 3. Warm water soaks 4. Strict return precautions if not improving within 48 hours, follow-up if needed      Relevant Medications   sulfamethoxazole-trimethoprim (BACTRIM DS,SEPTRA DS) 800-160 MG tablet      Meds ordered this encounter  Medications  . methylPREDNISolone acetate (DEPO-MEDROL) injection 40 mg  . lidocaine (PF) (XYLOCAINE) 1 % injection 4 mL  .               Marland Kitchen naproxen (NAPROSYN) 500 MG tablet    Sig: Take 1 tablet (500 mg total) by mouth 2 (two) times daily with a meal.    Dispense:  60 tablet    Refill:  0  .               . sulfamethoxazole-trimethoprim (BACTRIM DS,SEPTRA DS) 800-160 MG tablet    Sig: Take 2 tablets by mouth 2 (two) times daily. For 7 days    Dispense:  28 tablet    Refill:  0      Follow up plan: Return in about 1 week (around 01/02/2016), or if symptoms worsen or fail to improve, for cellulitis.  Saralyn Pilar, DO [facility] 12/26/2015, 1:59 PM

## 2015-12-26 NOTE — Assessment & Plan Note (Signed)
Subacute on chronic left knee lateral pain following recent acute twist injury 10/2015, given history of old acute L knee meniscus injury >18 years ago, concern for re-injury especially with recurrent swelling and some instability / mechanical symptoms. - Recent X-ray 9/21 without DJD or acute findings  Plan: 1. Received L knee steroid injection today, see procedure note - given prior improvement on prednisone oral, and limited improvement with conservative therapy over past 2 months 2. Referral to [facility] for further evaluation, likely will need advanced imaging MRI vs surgical / arthroscopic evaluation 3. Start Naproxen 500mg  BID wc for 2-4 weeks then PRN 4. Written work note for [employer] starting today through rest of week, will return to regular duty on Monday 11/13 5. Continue knee brace, RICE therapy 6. Follow-up as needed

## 2015-12-26 NOTE — Patient Instructions (Signed)
Thank you for coming in to clinic today.  1.   . You received Left knee steroid injection today, the numbing medicine may help initially, and you may experience a steroid flare overnight with some increased pain, but this should improve within 24-48 hours, and hopefully you get relief lasting longer. - Take it easy for next 1-2 days, avoid over activity and walking too much, can use ice packs as needed for relief  Recommend trial of Anti-inflammatory with Naproxen (Naprosyn) 500mg  tabs - take one with food and plenty of water TWICE daily every day (breakfast and dinner), for next 2 to 4 weeks, then you may take only as needed - DO NOT TAKE any ibuprofen, aleve, motrin while you are taking this medicine - It is safe to take Tylenol Ext Str 500mg  tabs - take 1 to 2 (max dose 1000mg ) every 6 hours as needed for breakthrough pain, max 24 hour daily dose is 6 to 8 tablets or 4000mg   Referral to [facility], stay tuned for appointment  For Left arm cellulitis, this is skin infection. Take Bactrim-DS antibiotic 2 pill per dose TWICE a day for 7 days, if redness is spreading, worsening, fevers, or drainage follow-up within next 2 days, otherwise if significant worsening may need to go to hospital.  Please schedule a follow-up appointment with Dr. Althea CharonKaramalegos in 1 week if cellulitis is worsening  If you have any other questions or concerns, please feel free to call the clinic or send a message through MyChart. You may also schedule an earlier appointment if necessary.  Saralyn PilarAlexander Karamalegos, DO [facility]

## 2015-12-26 NOTE — Assessment & Plan Note (Signed)
Secondary to recent bug bite or scratch with initial purulent drainage. Now secondary cellulitis, minimal abscess. Cover for MRSA.  Plan: 1. Marked erythema border today 2. Bactrim DS x 2 pills BID 7d 3. Warm water soaks 4. Strict return precautions if not improving within 48 hours, follow-up if needed

## 2015-12-26 NOTE — Assessment & Plan Note (Signed)
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 Exam with positive McMurray and Thessaly - L knee steroid injection today - referral to [facility], will need advanced imaging and evaluation/management

## 2016-02-19 ENCOUNTER — Other Ambulatory Visit: Payer: Self-pay | Admitting: Pain Medicine

## 2016-07-02 ENCOUNTER — Other Ambulatory Visit: Payer: Self-pay

## 2016-07-02 ENCOUNTER — Telehealth: Payer: Self-pay

## 2016-07-02 DIAGNOSIS — I10 Essential (primary) hypertension: Secondary | ICD-10-CM

## 2016-07-02 MED ORDER — LISINOPRIL 30 MG PO TABS: 30.0000 mg | ORAL_TABLET | Freq: Every day | ORAL | 1 refills | Status: DC

## 2016-07-03 NOTE — Telephone Encounter (Signed)
Open in error

## 2016-07-08 ENCOUNTER — Other Ambulatory Visit: Payer: Self-pay

## 2016-07-08 DIAGNOSIS — I10 Essential (primary) hypertension: Secondary | ICD-10-CM

## 2016-08-26 ENCOUNTER — Emergency Department: Payer: Self-pay

## 2016-08-26 ENCOUNTER — Encounter: Payer: Self-pay | Admitting: Emergency Medicine

## 2016-08-26 ENCOUNTER — Emergency Department
Admission: EM | Admit: 2016-08-26 | Discharge: 2016-08-26 | Disposition: A | Discharge status: Home / Self Care | Payer: Self-pay | Attending: Emergency Medicine | Admitting: Emergency Medicine

## 2016-08-26 DIAGNOSIS — Y929 Unspecified place or not applicable: Secondary | ICD-10-CM | POA: Insufficient documentation

## 2016-08-26 DIAGNOSIS — Y990 Civilian activity done for income or pay: Secondary | ICD-10-CM | POA: Insufficient documentation

## 2016-08-26 DIAGNOSIS — M25562 Pain in left knee: Secondary | ICD-10-CM

## 2016-08-26 DIAGNOSIS — Y9389 Activity, other specified: Secondary | ICD-10-CM | POA: Insufficient documentation

## 2016-08-26 DIAGNOSIS — Z79899 Other long term (current) drug therapy: Secondary | ICD-10-CM | POA: Insufficient documentation

## 2016-08-26 DIAGNOSIS — Z7982 Long term (current) use of aspirin: Secondary | ICD-10-CM | POA: Insufficient documentation

## 2016-08-26 DIAGNOSIS — M23207 Derangement of unspecified meniscus due to old tear or injury, left knee: Secondary | ICD-10-CM

## 2016-08-26 DIAGNOSIS — W1789XA Other fall from one level to another, initial encounter: Secondary | ICD-10-CM | POA: Insufficient documentation

## 2016-08-26 DIAGNOSIS — S9031XA Contusion of right foot, initial encounter: Secondary | ICD-10-CM | POA: Insufficient documentation

## 2016-08-26 DIAGNOSIS — I10 Essential (primary) hypertension: Secondary | ICD-10-CM | POA: Insufficient documentation

## 2016-08-26 MED ORDER — KETOROLAC TROMETHAMINE 30 MG/ML IJ SOLN
30.0000 mg | Freq: Once | INTRAMUSCULAR | Status: AC
  Administered 2016-08-26: 30 mg via INTRAMUSCULAR
  Filled 2016-08-26: qty 1

## 2016-08-26 MED ORDER — NAPROXEN 500 MG PO TABS: 500.0000 mg | ORAL_TABLET | Freq: Two times a day (BID) | ORAL | 0 refills | Status: DC

## 2016-08-26 NOTE — ED Triage Notes (Signed)
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

## 2016-08-26 NOTE — ED Provider Notes (Signed)
[facility] Emergency Department Provider Note   ____________________________________________   First MD Initiated Contact with Patient 08/26/16 1024     (approximate)  I have reviewed the triage vital signs and the nursing notes.   HISTORY  Chief Complaint Foot Pain    HPI Gary Schultz is a 51 y.o. male is here complaining of right foot pain. 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 Patient denies any head injury or loss of consciousness. Patient denies any previous injury to his right foot. He states it is painful to bear weight. He also describes it as feeling like a nail is stuck in it. Currently rates his pain as 7 out of 10.   Past Medical History:  Diagnosis Date  . Anxiety   . Carpal tunnel syndrome    bilateral  . Generalized headaches    from neck injury  . Hypertension   . Neuromuscular disorder (HCC)   . Varicose veins     Patient Active Problem List   Diagnosis Date Noted  . Tear of meniscus of knee joint 12/26/2015  . Cellulitis of left upper extremity 12/26/2015  . Numbness and tingling in both hands 11/08/2015  . Hypertension 08/23/2015  . Lateral knee pain 08/23/2015  . DDD (degenerative disc disease), lumbar 08/02/2014  . Facet syndrome, lumbar (HCC) 08/02/2014  . Cervical facet syndrome (HCC) 08/02/2014  . Carpal tunnel syndrome 08/02/2014    Past Surgical History:  Procedure Laterality Date  . BACK SURGERY    . SHOULDER SURGERY Right 1992    Prior to Admission medications   Medication Sig Start Date End Date Taking? Authorizing Provider  aspirin 81 MG tablet Take 81 mg by mouth daily.    [provider]  lisinopril (PRINIVIL,ZESTRIL) 30 MG tablet Take 1 tablet (30 mg total) by mouth daily. 07/02/16   Karamalegos, Netta Neat, DO  naproxen (NAPROSYN) 500 MG tablet Take 1 tablet (500 mg total) by mouth 2 (two) times daily with a meal. 08/26/16   Tommi Rumps, PA-C  Oxycodone HCl 10 MG TABS Limited 3-6 tabs by mouth per day if tolerated 10/24/15   Ewing Schlein, MD    Allergies Tylenol [acetaminophen]  Family History  Problem Relation Age of Onset  . Cancer Mother   . Varicose Veins Mother   . Cancer Father   . Stroke Father   . Hypertension Father     Social History Social History  Substance Use Topics  . Smoking status: Never Smoker  . Smokeless tobacco: Never Used  . Alcohol use No    Review of Systems  Constitutional: No fever/chills Cardiovascular: Denies chest pain. Respiratory: Denies shortness of breath. Gastrointestinal: No abdominal pain.  No nausea, no vomiting.   Musculoskeletal: Positive for right foot pain. Skin: Negative for rash. Neurological: Negative for headaches, focal weakness or numbness.   ____________________________________________   PHYSICAL EXAM:  VITAL SIGNS: ED Triage Vitals  Enc Vitals Group     BP      Pulse      Resp      Temp      Temp src      SpO2      Weight      Height      Head Circumference      Peak Flow      Pain Score      Pain Loc      Pain Edu?  Excl. in GC?     Constitutional: Alert and oriented. Well appearing and in no acute distress. Eyes: Conjunctivae are normal. PERRL. EOMI. Head: Atraumatic. Neck: No stridor.   Cardiovascular: Normal rate, regular rhythm. Grossly normal heart sounds.  Good peripheral circulation. Respiratory: Normal respiratory effort.  No retractions. Lungs CTAB. Gastrointestinal: Soft and nontender. No distention.  Musculoskeletal: On examination of the right foot there is no gross deformity noted. There is some soft tissue swelling present on the dorsal aspect lateral right foot. Area is tender to touch. Motor sensory function is intact distal to the injury. Skin is intact. Neurologic:  Normal speech and language. No gross focal neurologic deficits are appreciated. Skin:  Skin is warm, dry and intact.  Psychiatric: Mood and  affect are normal. Speech and behavior are normal.  ____________________________________________   LABS (all labs ordered are listed, but only abnormal results are displayed)  Labs Reviewed - No data to display  RADIOLOGY  Dg Foot Complete Right  Result Date: 08/26/2016 CLINICAL DATA:  Foot pain secondary to a fall. EXAM: RIGHT FOOT COMPLETE - 3+ VIEW COMPARISON:  None. FINDINGS: There is no evidence of fracture or dislocation. There is no evidence of arthropathy or other significant focal bone abnormality. Soft tissues are unremarkable. IMPRESSION: Negative. Electronically Signed   By: Francene BoyersJames  Maxwell M.D.   On: 08/26/2016 10:48    ____________________________________________   PROCEDURES  Procedure(s) performed: None  Procedures  Critical Care performed: No  ____________________________________________   INITIAL IMPRESSION / ASSESSMENT AND PLAN / ED COURSE  Pertinent labs & imaging results that were available during my care of the patient were reviewed by me and considered in my medical decision making (see chart for details).  Patient was placed in an Ace wrap and postop shoe for support. He is instructed to ice and elevate his foot to reduce swelling and help with pain. Patient chronically takes oxycodone 10 mg for chronic back pain. He states he did not take this today. He is given a prescription for naproxen 500 mg twice a day with food as needed for pain and inflammation. He was also given Toradol 30 mg IM while waiting for his x-ray results. Patient was made aware that there was no fracture seen on his x-rays. He is to follow-up with his PCP if any continued problems. He is also encouraged to use ice and elevation if needed for foot pain and swelling.    ____________________________________________   FINAL CLINICAL IMPRESSION(S) / ED DIAGNOSES  Final diagnoses:  Contusion of right foot, initial encounter      NEW MEDICATIONS STARTED DURING THIS  VISIT:  Discharge Medication List as of 08/26/2016 11:10 AM       Note:  This document was prepared using Dragon voice recognition software and may include unintentional dictation errors.    Tommi RumpsSummers, Linnell Swords L, PA-C 08/26/16 1210    Myrna BlazerSchaevitz, David Matthew, MD 08/26/16 1425

## 2016-08-26 NOTE — Discharge Instructions (Signed)
Ice and elevate right foot as needed for pain and swelling. Wear Ace wrap and use postop shoe for support. Continue taking your regular medication and naproxen 500 mg twice a day with food. Follow-up with your primary care provider if any continued problems.

## 2016-10-07 ENCOUNTER — Other Ambulatory Visit: Payer: Self-pay | Admitting: Family Medicine

## 2016-10-07 DIAGNOSIS — I10 Essential (primary) hypertension: Secondary | ICD-10-CM

## 2016-10-15 ENCOUNTER — Emergency Department (HOSPITAL_COMMUNITY)
Admission: EM | Admit: 2016-10-15 | Discharge: 2016-10-15 | Disposition: A | Discharge status: Home / Self Care | Payer: Self-pay | Attending: Emergency Medicine | Admitting: Emergency Medicine

## 2016-10-15 ENCOUNTER — Encounter (HOSPITAL_COMMUNITY): Payer: Self-pay | Admitting: Emergency Medicine

## 2016-10-15 ENCOUNTER — Ambulatory Visit (INDEPENDENT_AMBULATORY_CARE_PROVIDER_SITE_OTHER): Payer: Worker's Compensation

## 2016-10-15 ENCOUNTER — Ambulatory Visit
Admission: EM | Admit: 2016-10-15 | Discharge: 2016-10-15 | Disposition: A | Discharge status: Home / Self Care | Payer: Worker's Compensation | Attending: Emergency Medicine | Admitting: Emergency Medicine

## 2016-10-15 ENCOUNTER — Encounter: Payer: Self-pay | Admitting: Emergency Medicine

## 2016-10-15 DIAGNOSIS — Y990 Civilian activity done for income or pay: Secondary | ICD-10-CM | POA: Insufficient documentation

## 2016-10-15 DIAGNOSIS — I10 Essential (primary) hypertension: Secondary | ICD-10-CM | POA: Insufficient documentation

## 2016-10-15 DIAGNOSIS — Z23 Encounter for immunization: Secondary | ICD-10-CM

## 2016-10-15 DIAGNOSIS — Y9259 Other trade areas as the place of occurrence of the external cause: Secondary | ICD-10-CM | POA: Insufficient documentation

## 2016-10-15 DIAGNOSIS — S6991XA Unspecified injury of right wrist, hand and finger(s), initial encounter: Secondary | ICD-10-CM

## 2016-10-15 DIAGNOSIS — S6010XA Contusion of unspecified finger with damage to nail, initial encounter: Secondary | ICD-10-CM

## 2016-10-15 DIAGNOSIS — Y33XXXA Other specified events, undetermined intent, initial encounter: Secondary | ICD-10-CM | POA: Insufficient documentation

## 2016-10-15 DIAGNOSIS — Y9389 Activity, other specified: Secondary | ICD-10-CM | POA: Insufficient documentation

## 2016-10-15 DIAGNOSIS — S60111A Contusion of right thumb with damage to nail, initial encounter: Secondary | ICD-10-CM

## 2016-10-15 DIAGNOSIS — Z7982 Long term (current) use of aspirin: Secondary | ICD-10-CM | POA: Insufficient documentation

## 2016-10-15 MED ORDER — TETANUS-DIPHTH-ACELL PERTUSSIS 5-2.5-18.5 LF-MCG/0.5 IM SUSP
0.5000 mL | Freq: Once | INTRAMUSCULAR | Status: AC
  Administered 2016-10-15: 0.5 mL via INTRAMUSCULAR

## 2016-10-15 MED ORDER — NAPROXEN 500 MG PO TABS: 500.0000 mg | ORAL_TABLET | Freq: Two times a day (BID) | ORAL | 0 refills | Status: DC

## 2016-10-15 MED ORDER — OXYCODONE HCL 5 MG PO TABS
10.0000 mg | ORAL_TABLET | Freq: Once | ORAL | Status: AC
  Administered 2016-10-15: 10 mg via ORAL
  Filled 2016-10-15: qty 2

## 2016-10-15 MED ORDER — KETOROLAC TROMETHAMINE 60 MG/2ML IM SOLN: 60.0000 mg | Freq: Once | INTRAMUSCULAR | Status: DC

## 2016-10-15 MED ORDER — KETOROLAC TROMETHAMINE 30 MG/ML IJ SOLN
30.0000 mg | Freq: Once | INTRAMUSCULAR | Status: AC
  Administered 2016-10-15: 30 mg via INTRAMUSCULAR

## 2016-10-15 NOTE — ED Notes (Signed)
Right hand soaked in NS and Hibiclens

## 2016-10-15 NOTE — Discharge Instructions (Signed)
[facility]. You may need evaluation by a hand specialist.

## 2016-10-15 NOTE — ED Triage Notes (Addendum)
Pt states got  Rt thumb  Got in grider and he jerked his thumb out which jerked his nail off and bent his thumb back , [facility] [place] and  Sent  To see hand specialist , also states when he  Jerked his hand back he twisted his neck and shoulder

## 2016-10-15 NOTE — ED Notes (Signed)
Pt verbalized understanding discharge instructions and denies any further needs or questions at this time. VS stable, ambulatory and steady gait.   

## 2016-10-15 NOTE — ED Provider Notes (Signed)
[facility]-EMERGENCY DEPT Provider Note   CSN: [id number] Arrival date & time: 10/15/16  1518     History   Chief Complaint Chief Complaint  Patient presents with  . Hand Injury    HPI Gary Schultz is a 51 y.o. male.  HPI    51 year old male presents today for secondary evaluation status post injury to his right thumb.  Patient was seen at outside facility today.  Patient suffered an injury to his right thumb while at work.  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  This caused the fingernail to pull away causing a subungual hematoma.  Patient had pain throughout the entire thumb with limited range of motion and decreased sensation at the distal tip of the thumb.  Patient had tetanus updated wound cleaned and irrigated, and placed in a splint.  Patient sent here for consultation with orthopedic hand.   Past Medical History:  Diagnosis Date  . Anxiety   . Carpal tunnel syndrome    bilateral  . Generalized headaches    from neck injury  . Hypertension   . Neuromuscular disorder (HCC)   . Varicose veins     Patient Active Problem List   Diagnosis Date Noted  . Tear of meniscus of knee joint 12/26/2015  . Cellulitis of left upper extremity 12/26/2015  . Numbness and tingling in both hands 11/08/2015  . Hypertension 08/23/2015  . Lateral knee pain 08/23/2015  . DDD (degenerative disc disease), lumbar 08/02/2014  . Facet syndrome, lumbar (HCC) 08/02/2014  . Cervical facet syndrome (HCC) 08/02/2014  . Carpal tunnel syndrome 08/02/2014    Past Surgical History:  Procedure Laterality Date  . BACK SURGERY    . SHOULDER SURGERY Right 1992       Home Medications    Prior to Admission medications   Medication Sig Start Date End Date Taking? Authorizing Provider  aspirin 81 MG tablet Take 81 mg by mouth daily.    [provider]  lisinopril (PRINIVIL,ZESTRIL) 30 MG tablet TAKE 1 TABLET BY MOUTH ONCE DAILY  10/07/16   Althea CharonKaramalegos, Netta NeatAlexander J, DO  naproxen (NAPROSYN) 500 MG tablet Take 1 tablet (500 mg total) by mouth 2 (two) times daily. 10/15/16   Chairty Toman, Tinnie GensJeffrey, PA-C  Oxycodone HCl 10 MG TABS Limited 3-6 tabs by mouth per day if tolerated 10/24/15   Ewing Schleinrisp, Gregory, MD    Family History Family History  Problem Relation Age of Onset  . Cancer Mother   . Varicose Veins Mother   . Cancer Father   . Stroke Father   . Hypertension Father     Social History Social History  Substance Use Topics  . Smoking status: Never Smoker  . Smokeless tobacco: Never Used  . Alcohol use No     Allergies   Tylenol [acetaminophen]   Review of Systems Review of Systems  All other systems reviewed and are negative.    Physical Exam Updated Vital Signs BP (!) 145/87   Pulse 76   Temp 98.2 F (36.8 C) (Oral)   Resp 16   SpO2 99%   Physical Exam  Constitutional: He is oriented to person, place, and time. He appears well-developed and well-nourished.  HENT:  Head: Normocephalic and atraumatic.  Eyes: Pupils are equal, round, and reactive to light. Conjunctivae are normal. Right eye exhibits no discharge. Left eye exhibits no discharge. No scleral icterus.  Neck: Normal range of motion. No JVD present. No tracheal  deviation present.  Pulmonary/Chest: Effort normal. No stridor.  Musculoskeletal:  Swelling over the right thumb, perfusion intact, decreased sensation around the distal aspect of the thumb.  No significant laxity noted on exam although difficult exam due to patient's comfort level.  Neurological: He is alert and oriented to person, place, and time. Coordination normal.  Psychiatric: He has a normal mood and affect. His behavior is normal. Judgment and thought content normal.  Nursing note and vitals reviewed.    ED Treatments / Results  Labs (all labs ordered are listed, but only abnormal results are displayed) Labs Reviewed - No data to display  EKG  EKG  Interpretation None       Radiology Dg Finger Thumb Right  Result Date: 10/15/2016 CLINICAL DATA:  Thumb injury at work with nail bed injury. Initial encounter. EXAM: RIGHT THUMB 2+V COMPARISON:  None. FINDINGS: Multiple punctate radiodensities along the superficial nail bed and just proximal to the cuticle. These appears superficial. No acute fracture or dislocation. First MCP degenerative narrowing and spurring. IMPRESSION: 1. No acute osseous finding. 2. Superficial punctate debris about the proximal nail. 3. MCP joint osteoarthritis. Electronically Signed   By: Marnee Spring M.D.   On: 10/15/2016 13:02    Procedures Procedures (including critical care time)  Medications Ordered in ED Medications  oxyCODONE (Oxy IR/ROXICODONE) immediate release tablet 10 mg (10 mg Oral Given 10/15/16 1801)     Initial Impression / Assessment and Plan / ED Course  I have reviewed the triage vital signs and the nursing notes.  Pertinent labs & imaging results that were available during my care of the patient were reviewed by me and considered in my medical decision making (see chart for details).     Final Clinical Impressions(s) / ED Diagnoses   Final diagnoses:  Injury of right thumb, initial encounter    Patient sent for evaluation with hand surgery.  Patient has swelling around the thumb, no significant laxity that would require immediate evaluation by hand surgeon.  I consulted Dr. Orlan Leavens as this patient was Artie sent to the emergency room who requested the patient follow up next week in office.  Patient given pain medication here, he has narcotics at home, encouraged to use naproxen, given strict return precautions, follow-up information.  He verbalized understanding and agreement to today's plan had no further questions or concerns  New Prescriptions Discharge Medication List as of 10/15/2016  7:00 PM       Eyvonne Mechanic, Cordelia Poche 10/15/16 2046    Bethann Berkshire, MD 10/16/16  1255

## 2016-10-15 NOTE — ED Triage Notes (Signed)
Patient was grinding a piece of steel and the machine pulled the piece of steel and his right thumb back.  Patient states that he heard a pop in his right thumb and is having some bleeding under his finger nail.

## 2016-10-15 NOTE — ED Notes (Signed)
Right thumb irrigated with NS around nailbed

## 2016-10-15 NOTE — Discharge Instructions (Signed)
Please read attached information. If you experience any new or worsening signs or symptoms please return to the emergency room for evaluation. Please follow-up with your primary care provider or specialist as discussed. Please use medication prescribed only as directed and discontinue taking if you have any concerning signs or symptoms.   °

## 2016-10-15 NOTE — ED Provider Notes (Signed)
HPI  SUBJECTIVE:  Gary Schultz is a handed 51 y.o. male who presents with a right thumb injury sustained at work. Patient states that he was working on a grinder and a piece of metal shot out, hyperextending his thumb and completely ripping the thumbnail off. Patient states that he pushed the nail back down and now reports burning constant pain and tingling in his entire thumb. Reports swelling at the MCP. States that he felt a "pop" in the MCP states that he cannot move his thumb at all. He also sustained a laceration along the dorsal aspect of his thumb. Symptoms are worse with movement, palpation, no alleviating factors. He has not tried anything for this. he denies any other injury to his hand. He also states that he jerked his arm back and now reports shoulder and neck soreness as well. He has a past medical history of carpal tunnel syndrome, hypertension, chronic back pain for which she takes opiates. No history of diabetes, smoking. PMD: Smitty Cords, DO     Past Medical History:  Diagnosis Date  . Anxiety   . Carpal tunnel syndrome    bilateral  . Generalized headaches    from neck injury  . Hypertension   . Neuromuscular disorder (HCC)   . Varicose veins     Past Surgical History:  Procedure Laterality Date  . BACK SURGERY    . SHOULDER SURGERY Right 1992    Family History  Problem Relation Age of Onset  . Cancer Mother   . Varicose Veins Mother   . Cancer Father   . Stroke Father   . Hypertension Father     Social History  Substance Use Topics  . Smoking status: Never Smoker  . Smokeless tobacco: Never Used  . Alcohol use No    No current facility-administered medications for this encounter.   Current Outpatient Prescriptions:  .  aspirin 81 MG tablet, Take 81 mg by mouth daily., Disp: , Rfl:  .  lisinopril (PRINIVIL,ZESTRIL) 30 MG tablet, TAKE 1 TABLET BY MOUTH ONCE DAILY, Disp: 30 tablet, Rfl: 2 .  naproxen (NAPROSYN) 500 MG tablet, Take 1  tablet (500 mg total) by mouth 2 (two) times daily with a meal., Disp: 30 tablet, Rfl: 0 .  Oxycodone HCl 10 MG TABS, Limited 3-6 tabs by mouth per day if tolerated, Disp: 170 tablet, Rfl: 0  Allergies  Allergen Reactions  . Tylenol [Acetaminophen] Rash     ROS  As noted in HPI.   Physical Exam  BP (!) 145/92 (BP Location: Left Arm)   Pulse 71   Temp 98.3 F (36.8 C) (Oral)   Resp 16   Ht 6\' 1"  (1.854 m)   Wt 210 lb (95.3 kg)   SpO2 99%   BMI 27.71 kg/m   Constitutional: Well developed, well nourished, in moderate painful distress Eyes:  EOMI, conjunctiva normal bilaterally HENT: Normocephalic, atraumatic,mucus membranes moist Respiratory: Normal inspiratory effort Cardiovascular: Normal rate GI: nondistended skin: No rash, skin intact Musculoskeletal: Swelling, tenderness along the right MCP. Thumb held in slight flexion. Pt unable to move thumb at all. Diffuse tenderness over the entire thumb. Positive pain with any range of motion. Mild tenderness along the thenar eminence. Positive laxity of the ulnar collateral ligament. radial collateral ligament seems intact. Cap refill less than 2 seconds. 2 point discrimination absent. Superficial linear laceration on the dorsal aspect. Positive subungual hematoma measuring approximately 50%. Wrist WNL.  Neurologic: Alert & oriented x 3, no focal neuro  deficits Psychiatric: Speech and behavior appropriate   ED Course   Medications  Tdap (BOOSTRIX) injection 0.5 mL (0.5 mLs Intramuscular Given 10/15/16 1245)  ketorolac (TORADOL) 30 MG/ML injection 30 mg (30 mg Intramuscular Given 10/15/16 1325)    Orders Placed This Encounter  Procedures  . DG Finger Thumb Right    Standing Status:   Standing    Number of Occurrences:   1    Order Specific Question:   Reason for Exam (SYMPTOM  OR DIAGNOSIS REQUIRED)    Answer:   Right thumb pain due to injury    No results found for this or any previous visit (from the past 24  hour(s)). Dg Finger Thumb Right  Result Date: 10/15/2016 CLINICAL DATA:  Thumb injury at work with nail bed injury. Initial encounter. EXAM: RIGHT THUMB 2+V COMPARISON:  None. FINDINGS: Multiple punctate radiodensities along the superficial nail bed and just proximal to the cuticle. These appears superficial. No acute fracture or dislocation. First MCP degenerative narrowing and spurring. IMPRESSION: 1. No acute osseous finding. 2. Superficial punctate debris about the proximal nail. 3. MCP joint osteoarthritis. Electronically Signed   By: Marnee SpringJonathon  Watts M.D.   On: 10/15/2016 13:02    ED Clinical Impression  Injury of right thumb, initial encounter  Subungual hematoma of digit of hand, initial encounter  ED Assessment/Plan  [place] Narcotic database reviewed for this patient. Patient has been getting OxyContin 10 mg from the same provider for the past 6 months. Last OxyContin prescription filled 8/21 #170.   Tetanus updated. The nail was irrigated to remove any debris. Patient placed in thumb spica, given 30 mg of Toradol IM.   Given the absence of sensation in his thumb and inability to move his thumb with a laxity of the ulnar collateral ligament, concern for significant ligamentous and nerve damage. [facility] [place] [facility] [place] [facility] [place] [facility] [facility] the [facility] for specialty evaluation.  Reviewed imaging independently. No acute fracture dislocation, MCP joint osteoarthritis per radiology See radiology report for full details.  Discussed  MDM, plan and followup with patient. Discussed sn/sx that should prompt return to the ED. Patient  agrees with plan.   Meds ordered this encounter  Medications  . Tdap (BOOSTRIX) injection 0.5 mL  . DISCONTD: ketorolac (TORADOL) injection 60 mg  . ketorolac (TORADOL) 30 MG/ML injection 30 mg    *This clinic note was created using Scientist, clinical (histocompatibility and immunogenetics)Dragon dictation software. Therefore, there may be occasional mistakes despite careful proofreading.  ?    Domenick GongMortenson, Hagen Tidd, MD 10/15/16 1342

## 2016-10-31 ENCOUNTER — Other Ambulatory Visit (HOSPITAL_COMMUNITY): Payer: Self-pay | Admitting: Orthopedic Surgery

## 2016-10-31 DIAGNOSIS — S63641A Sprain of metacarpophalangeal joint of right thumb, initial encounter: Secondary | ICD-10-CM

## 2016-11-04 ENCOUNTER — Ambulatory Visit (HOSPITAL_COMMUNITY): Payer: Self-pay

## 2016-11-04 ENCOUNTER — Ambulatory Visit (HOSPITAL_COMMUNITY)
Admission: RE | Admit: 2016-11-04 | Discharge: 2016-11-04 | Disposition: A | Discharge status: Home / Self Care | Payer: Self-pay | Source: Ambulatory Visit | Attending: Orthopedic Surgery | Admitting: Orthopedic Surgery

## 2016-11-04 DIAGNOSIS — S63641A Sprain of metacarpophalangeal joint of right thumb, initial encounter: Secondary | ICD-10-CM | POA: Insufficient documentation

## 2016-11-04 DIAGNOSIS — X58XXXA Exposure to other specified factors, initial encounter: Secondary | ICD-10-CM | POA: Insufficient documentation

## 2016-11-05 ENCOUNTER — Ambulatory Visit (HOSPITAL_COMMUNITY): Admission: RE | Admit: 2016-11-05 | Payer: Self-pay | Source: Ambulatory Visit

## 2016-11-27 ENCOUNTER — Telehealth: Payer: Self-pay | Admitting: Family Medicine

## 2016-11-27 NOTE — Telephone Encounter (Signed)
Pt called few minutes later insisting to check his blood pressure by office since he has taken 2 lisinopril and once again advised him that if he has high blood pressure we will call 911. As per patient he is calling his Louann Sjogren first. Leotis Shames and Morrie Sheldon is aware.

## 2016-11-27 NOTE — Telephone Encounter (Signed)
As per patient checked blood pressure yesterday was 190/140 had little HA  And blurriness but today's reading while resting is 240/189 and has HA and blurriness pulse is 65 bpm pt had taken 2 lisinopril advised patient should call 911 or have some one take him to ER just get evaluated. Pt understand very well.

## 2016-11-28 ENCOUNTER — Telehealth: Payer: Self-pay

## 2016-11-28 ENCOUNTER — Encounter: Payer: Self-pay | Admitting: Family Medicine

## 2016-11-28 ENCOUNTER — Ambulatory Visit (INDEPENDENT_AMBULATORY_CARE_PROVIDER_SITE_OTHER): Payer: Self-pay | Admitting: Nurse Practitioner

## 2016-11-28 VITALS — BP 130/80 | HR 70 | Temp 98.2°F | Resp 16 | Ht 71.0 in | Wt 211.6 lb

## 2016-11-28 DIAGNOSIS — I10 Essential (primary) hypertension: Secondary | ICD-10-CM

## 2016-11-28 DIAGNOSIS — R142 Eructation: Secondary | ICD-10-CM

## 2016-11-28 DIAGNOSIS — R079 Chest pain, unspecified: Secondary | ICD-10-CM

## 2016-11-28 LAB — COMPREHENSIVE METABOLIC PANEL
AG Ratio: 1.9 (calc) (ref 1.0–2.5)
ALT: 23 U/L (ref 9–46)
AST: 15 U/L (ref 10–40)
Albumin: 4.4 g/dL (ref 3.6–5.1)
Alkaline phosphatase (APISO): 69 U/L (ref 40–115)
BUN: 14 mg/dL (ref 7–25)
CO2: 28 mmol/L (ref 20–32)
Calcium: 9.3 mg/dL (ref 8.6–10.3)
Chloride: 104 mmol/L (ref 98–110)
Creat: 0.93 mg/dL (ref 0.60–1.35)
Globulin: 2.3 g/dL (calc) (ref 1.9–3.7)
Glucose, Bld: 95 mg/dL (ref 65–99)
Potassium: 4.3 mmol/L (ref 3.5–5.3)
Sodium: 140 mmol/L (ref 135–146)
Total Bilirubin: 0.5 mg/dL (ref 0.2–1.2)
Total Protein: 6.7 g/dL (ref 6.1–8.1)

## 2016-11-28 LAB — CBC WITH DIFFERENTIAL/PLATELET
Basophils Absolute: 38 cells/uL (ref 0–200)
Basophils Relative: 0.6 %
Eosinophils Absolute: 102 cells/uL (ref 15–500)
Eosinophils Relative: 1.6 %
HCT: 40.7 % (ref 38.5–50.0)
Hemoglobin: 14.4 g/dL (ref 13.2–17.1)
Lymphs Abs: 1690 cells/uL (ref 850–3900)
MCH: 31.4 pg (ref 27.0–33.0)
MCHC: 35.4 g/dL (ref 32.0–36.0)
MCV: 88.7 fL (ref 80.0–100.0)
MPV: 11.3 fL (ref 7.5–12.5)
Monocytes Relative: 8.2 %
Neutro Abs: 4045 cells/uL (ref 1500–7800)
Neutrophils Relative %: 63.2 %
Platelets: 169 10*3/uL (ref 140–400)
RBC: 4.59 10*6/uL (ref 4.20–5.80)
RDW: 12.2 % (ref 11.0–15.0)
Total Lymphocyte: 26.4 %
WBC mixed population: 525 cells/uL (ref 200–950)
WBC: 6.4 10*3/uL (ref 3.8–10.8)

## 2016-11-28 LAB — TROPONIN I: Troponin I: 0.01 ng/mL (ref <=0.0)

## 2016-11-28 MED ORDER — HYDROCHLOROTHIAZIDE 12.5 MG PO TABS: 12.5000 mg | ORAL_TABLET | Freq: Every day | ORAL | 2 refills | Status: DC

## 2016-11-28 NOTE — Patient Instructions (Addendum)
Gary Schultz, Thank you for coming in to clinic today.  1. For your blood pressure: - Continue lisinopril 30 mg once daily. - START hydrochlorothiazide 12.5 mg once daily. - DO NOT take any extra lisinopril. - only check your BP once per day or if you are having symptoms.  - You could be having a hypertensive emergency w/ any BP over 190/100.  Especially if you are also having symptoms with the high blood pressure, you need urgent treatment.  CALL 9-1-1.    2. Work on Child psychotherapist. - Mindfulness, meditation, yoga, and deep breathing.  Please schedule a follow-up appointment with Cassell Smiles, AGNP. Return 3-5 days if symptoms worsen or fail to improve.  If you have any other questions or concerns, please feel free to call the clinic or send a message through [place]. You may also schedule an earlier appointment if necessary.  You will receive a survey after today's visit either digitally by e-mail or paper by [employer]. Your experiences and feedback matter to [place].  Please respond so we know how we are doing as we provide care for you.   Cassell Smiles, DNP, AGNP-BC Adult Gerontology Nurse Practitioner [facility], [facility]   Stress and Stress Management Stress is a normal reaction to life events. It is what you feel when life demands more than you are used to or more than you can handle. Some stress can be useful. For example, the stress reaction can help you catch the last bus of the day, study for a test, or meet a deadline at work. But stress that occurs too often or for too long can cause problems. It can affect your emotional health and interfere with relationships and normal daily activities. Too much stress can weaken your immune system and increase your risk for physical illness. If you already have a medical problem, stress can make it worse. What are the causes? All sorts of life events may cause stress. An event that causes stress for one person may not be  stressful for another person. Major life events commonly cause stress. These may be positive or negative. Examples include losing your job, moving into a new home, getting married, having a baby, or losing a loved one. Less obvious life events may also cause stress, especially if they occur day after day or in combination. Examples include working long hours, driving in traffic, caring for children, being in debt, or being in a difficult relationship. What are the signs or symptoms? Stress may cause emotional symptoms including, the following:  Anxiety. This is feeling worried, afraid, on edge, overwhelmed, or out of control.  Anger. This is feeling irritated or impatient.  Depression. This is feeling sad, down, helpless, or guilty.  Difficulty focusing, remembering, or making decisions.  Stress may cause physical symptoms, including the following:  Aches and pains. These may affect your head, neck, back, stomach, or other areas of your body.  Tight muscles or clenched jaw.  Low energy or trouble sleeping.  Stress may cause unhealthy behaviors, including the following:  Eating to feel better (overeating) or skipping meals.  Sleeping too little, too much, or both.  Working too much or putting off tasks (procrastination).  Smoking, drinking alcohol, or using drugs to feel better.  How is this diagnosed? Stress is diagnosed through an assessment by your health care provider. Your health care provider will ask questions about your symptoms and any stressful life events.Your health care provider will also ask about your medical history  and may order blood tests or other tests. Certain medical conditions and medicine can cause physical symptoms similar to stress. Mental illness can cause emotional symptoms and unhealthy behaviors similar to stress. Your health care provider may refer you to a mental health professional for further evaluation. How is this treated? Stress management is the  recommended treatment for stress.The goals of stress management are reducing stressful life events and coping with stress in healthy ways. Techniques for reducing stressful life events include the following:  Stress identification. Self-monitor for stress and identify what causes stress for you. These skills may help you to avoid some stressful events.  Time management. Set your priorities, keep a calendar of events, and learn to say "no." These tools can help you avoid making too many commitments.  Techniques for coping with stress include the following:  Rethinking the problem. Try to think realistically about stressful events rather than ignoring them or overreacting. Try to find the positives in a stressful situation rather than focusing on the negatives.  Exercise. Physical exercise can release both physical and emotional tension. The key is to find a form of exercise you enjoy and do it regularly.  Relaxation techniques. These relax the body and mind. Examples include yoga, meditation, tai chi, biofeedback, deep breathing, progressive muscle relaxation, listening to music, being out in nature, journaling, and other hobbies. Again, the key is to find one or more that you enjoy and can do regularly.  Healthy lifestyle. Eat a balanced diet, get plenty of sleep, and do not smoke. Avoid using alcohol or drugs to relax.  Strong support network. Spend time with family, friends, or other people you enjoy being around.Express your feelings and talk things over with someone you trust.  Counseling or talktherapy with a mental health professional may be helpful if you are having difficulty managing stress on your own. Medicine is typically not recommended for the treatment of stress.Talk to your health care provider if you think you need medicine for symptoms of stress. Follow these instructions at home:  Keep all follow-up visits as directed by your health care provider.  Take all medicines as  directed by your health care provider. Contact a health care provider if:  Your symptoms get worse or you start having new symptoms.  You feel overwhelmed by your problems and can no longer manage them on your own. Get help right away if:  You feel like hurting yourself or someone else. This information is not intended to replace advice given to you by your health care provider. Make sure you discuss any questions you have with your health care provider. Document Released: 07/30/2000 Document Revised: 07/12/2015 Document Reviewed: 09/28/2012 Elsevier Interactive Patient Education  2017 [employer].

## 2016-11-28 NOTE — Telephone Encounter (Signed)
-----   Message from Galen Manila, NP sent at 11/28/2016  3:49 PM EDT ----- Troponin negative, but cannot be trended in outpatient environment.  Timing of lab draw is >18 hours after symptoms, so this lab should have capture elevated Troponin if MI did occur yesterday.  However, MI cannot be excluded by one negative troponin lab.

## 2016-11-28 NOTE — Progress Notes (Signed)
Subjective:    Patient ID: Gary Schultz, male    DOB: 03/07/1965, 51 y.o.   MRN: [id number]  Gary Schultz is a 51 y.o. male presenting on 11/28/2016 for Hypertension (woke up mid night 1:30 am taken another pill lisinopril 30 mg at night 169/118 chest discomfort pain HA Left arm yesterday B/P 245/189 taken 20 mg lisinopril )   HPI Hypertension Last week - BP 138/84 Pt states "My blood pressure erratic." - has noticed correlation to alcohol consumption, salt intake: Drank 2 glasses wine Wed night.  [place] breast dinner which was low in salt. - Yesterday am had dizziness w/ leaning over.  Continued working.  On lunch break, had  blurry vision, chest discomfort, nausea, dizziness, and arm discomfort. Then "felt delusional" / unable to focus on tasks. - Denies diaphoresis, but had blurry vision, chest discomfort, left arm pain (attributes arm pain to his musculoskeletal problems).   - Yesterday afternoon.  Had elevated BP 245/189 w/ wrist machine.  - Takes lisinopril 30 mg once daily and normally takes oxycodone for chronic pain.  Dr. Metta Clines pain management doc.  Has higher bp if not taking regular pain medications.  With elevated BP readings over last 24 hours, pt has taken extra doses lisinopril: - Took am lisinopril yesterday am - 2 pm - 30 mg  Rechecked at [facility]. - 1:30 am woke up w/ symptoms headaches, delusional, chest pain. 30 mg  - No dose this morning.  Anxiety: Pt notes significant work stress and repeatedly brings up how many things he has to do.  He notes worst headache, symptoms of elevated BP and readings of elevated BP when he is more stressed. - Denies irritability or short temper w/ other people.  Admits he "internalizes stress" and has racing thoughts/rumination.  Belching Pt also notes significant belching and heartburn.  Believes his chest pain may be attributed to these symptoms of acid reflux.  Social History  Substance Use Topics  . Smoking status:  Never Smoker  . Smokeless tobacco: Never Used  . Alcohol use 0.0 oz/week    Review of Systems Per HPI unless specifically indicated above     Objective:    BP 130/80 (BP Location: Left Arm, Patient Position: Sitting, Cuff Size: Normal)   Pulse 70   Temp 98.2 F (36.8 C) (Oral)   Resp 16   Ht  (1.803 m)   Wt 211 lb 9.6 oz (96 kg)   BMI 29.51 kg/m   Wt Readings from Last 3 Encounters:  11/28/16 211 lb 9.6 oz (96 kg)  11/04/16 210 lb (95.3 kg)  10/15/16 210 lb (95.3 kg)    Physical Exam General - overweight, well-appearing, NAD HEENT - Normocephalic, atraumatic Neck - supple, non-tender, no LAD, no carotid bruit Heart - RRR, no murmurs heard Lungs - Clear throughout all lobes, no wheezing, crackles, or rhonchi. Normal work of breathing. Extremeties - non-tender, no edema, cap refill < 2 seconds, peripheral pulses intact +2 bilaterally Skin - warm, dry Neuro - awake, alert, oriented x3, normal gait Psych - Normal mood and affect, normal behavior    EKG 11/28/16: Sinus Bradycardia w/ 1st degree HB.  BBB in multiple leads consistent w/ EKG on 07/10/2008.  No changes from prior EKG except PR interval prolongation.  Results for orders placed or performed in visit on 11/28/16  CBC with Differential/Platelet  Result Value Ref Range   WBC 6.4 3.8 - 10.8 Thousand/uL   RBC 4.59 4.20 - 5.80 Million/uL  Hemoglobin 14.4 13.2 - 17.1 g/dL   HCT 41.3 24.4 - 01.0 %   MCV 88.7 80.0 - 100.0 fL   MCH 31.4 27.0 - 33.0 pg   MCHC 35.4 32.0 - 36.0 g/dL   RDW 27.2 53.6 - 64.4 %   Platelets 169 140 - 400 Thousand/uL   MPV 11.3 7.5 - 12.5 fL   Neutro Abs 4,045 1,500 - 7,800 cells/uL   Lymphs Abs 1,690 850 - 3,900 cells/uL   WBC mixed population 525 200 - 950 cells/uL   Eosinophils Absolute 102 15 - 500 cells/uL   Basophils Absolute 38 0 - 200 cells/uL   Neutrophils Relative % 63.2 %   Total Lymphocyte 26.4 %   Monocytes Relative 8.2 %   Eosinophils Relative 1.6 %   Basophils  Relative 0.6 %  Comprehensive metabolic panel  Result Value Ref Range   Glucose, Bld 95 65 - 99 mg/dL   BUN 14 7 - 25 mg/dL   Creat 0.34 7.42 - 5.95 mg/dL   BUN/Creatinine Ratio NOT APPLICABLE 6 - 22 (calc)   Sodium 140 135 - 146 mmol/L   Potassium 4.3 3.5 - 5.3 mmol/L   Chloride 104 98 - 110 mmol/L   CO2 28 20 - 32 mmol/L   Calcium 9.3 8.6 - 10.3 mg/dL   Total Protein 6.7 6.1 - 8.1 g/dL   Albumin 4.4 3.6 - 5.1 g/dL   Globulin 2.3 1.9 - 3.7 g/dL (calc)   AG Ratio 1.9 1.0 - 2.5 (calc)   Total Bilirubin 0.5 0.2 - 1.2 mg/dL   Alkaline phosphatase (APISO) 69 40 - 115 U/L   AST 15 10 - 40 U/L   ALT 23 9 - 46 U/L  Troponin I  Result Value Ref Range   Troponin I 0.01 < OR = 0.0 ng/mL      Assessment & Plan:   Problem List Items Addressed This Visit      Cardiovascular and Mediastinum   Hypertension    Uncontrolled hypertension w/ readings at home of 200/110 and 245/189 (likely inaccurate w/ different bp cuff), but both were above goal of <130/80 and were during other chest pain symptoms.  Pt taking lisinopril 30 mg once daily except when BP elevated and symptomatic. Pt has been checking BP readings once per hour.  Plan: 1. Continue lisinopril 30 mg once daily and only once daily.  Reviewed possible kidney damage at high doses > 40 mg per day. 2. START amlodipine 5 mg once daily.  Pt has had negative symptoms with hydrochlorothiazide, so was stopped. 3. BP goals reviewed.  Encouraged pt to only check BP once daily.  4. Recheck CMP, CBC, Troponin. 5. Followup as scheduled w/ Dr. Kirtland Bouchard and as needed.      Relevant Medications   amLODipine (NORVASC) 5 MG tablet   Other Relevant Orders   CBC with Differential/Platelet (Completed)   Comprehensive metabolic panel (Completed)   Troponin I (Completed)    Other Visit Diagnoses    Anxiety Moderate on presentation today.  Pt symptoms may indicate panic attack.   Plan: 1. Recommended pharmaceutical treatment vs therapy.  Pt declines  anxiety treatment at this time.  2. Reviewed stress management strategies.  Chest pain in adult    -  Primary Likely related to hypertension but may have also been panic attack w/ reports of significant anxiety.  See A/P for hypertension above.  Troponin I negative w/ single collection > 18 hours after initial symptoms.   Cannot exclude MI,  but w/ negative EKG and lack of persistent symptoms, MI is unlikely.   Relevant Orders   CBC with Differential/Platelet (Completed)   Comprehensive metabolic panel (Completed)   Troponin I (Completed)   Belching     GERD possible cause of some chest pain symptoms.  Pt having belching and heartburn.  Plan: 1. START omeprazole 20 mg once daily x 2 weeks and can continue if symptoms improve after 2 week trial. 2. Follow up w/ Dr. Kirtland Bouchard as scheduled and as needed.   Relevant Medications   omeprazole (PRILOSEC) 20 MG capsule      Meds ordered this encounter  Medications  . DISCONTD: hydrochlorothiazide (HYDRODIURIL) 12.5 MG tablet    Sig: Take 1 tablet (12.5 mg total) by mouth daily.    Dispense:  30 tablet    Refill:  2    Order Specific Question:   Supervising Provider    Answer:   Smitty Cords [[id number]]  . amLODipine (NORVASC) 5 MG tablet    Sig: Take 1 tablet (5 mg total) by mouth daily.    Dispense:  30 tablet    Refill:  2    Order Specific Question:   Supervising Provider    Answer:   Smitty Cords [[id number]]  . omeprazole (PRILOSEC) 20 MG capsule    Sig: Take 1 capsule (20 mg total) by mouth daily.    Dispense:  30 capsule    Refill:  2    Order Specific Question:   Supervising Provider    Answer:   Smitty Cords [[id number]]    Follow up plan: Return 3-5 days if symptoms worsen or fail to improve.  A total of 40 minutes was spent face-to-face with this patient. Greater than 50% of this time was spent in counseling and coordination of care with the patient.   Wilhelmina Mcardle, DNP, AGPCNP-BC Adult Gerontology  Primary Care Nurse Practitioner [facility] 12/02/2016, 6:22 PM

## 2016-11-28 NOTE — Telephone Encounter (Signed)
The pt was notified. No question or concerns.  

## 2016-12-01 ENCOUNTER — Telehealth: Payer: Self-pay

## 2016-12-01 MED ORDER — OMEPRAZOLE 20 MG PO CPDR: 20.0000 mg | DELAYED_RELEASE_CAPSULE | Freq: Every day | ORAL | 2 refills | Status: DC

## 2016-12-01 MED ORDER — AMLODIPINE BESYLATE 5 MG PO TABS: 5.0000 mg | ORAL_TABLET | Freq: Every day | ORAL | 2 refills | Status: DC

## 2016-12-01 NOTE — Telephone Encounter (Signed)
The pt called complaining of chest discomfort that he associates with his HCTZ 12.5MG  improves after he belches. He states he know its acid reflux, because the symptoms went away for a about an hour after taking zantac. I spoke w/ Leotis Shames and she gave a verbal consent to have the pt to discontinue his HCTZ and start on Amlodipine. She also sent over a Omeprazole for the acid reflux. I informed the pt if his symptoms worsen or doesn't improve. He needs to go to the hospital.

## 2016-12-02 NOTE — Assessment & Plan Note (Addendum)
Uncontrolled hypertension w/ readings at home of 200/110 and 245/189 (likely inaccurate w/ different bp cuff), but both were above goal of <130/80 and were during other chest pain symptoms.  Pt taking lisinopril 30 mg once daily except when BP elevated and symptomatic. Pt has been checking BP readings once per hour.  Plan: 1. Continue lisinopril 30 mg once daily and only once daily.  Reviewed possible kidney damage at high doses > 40 mg per day. 2. START amlodipine 5 mg once daily.  Pt has had negative symptoms with hydrochlorothiazide, so was stopped. 3. BP goals reviewed.  Encouraged pt to only check BP once daily.  4. Recheck CMP, CBC, Troponin. 5. Followup as scheduled w/ Dr. Kirtland Bouchard and as needed.

## 2016-12-03 NOTE — Addendum Note (Signed)
Addended by: Lonna CobbUSSELL, Aldine Chakraborty L on: 12/03/2016 01:52 PM   Modules accepted: Orders, SmartSet

## 2017-01-31 ENCOUNTER — Other Ambulatory Visit: Payer: Self-pay | Admitting: Family Medicine

## 2017-01-31 DIAGNOSIS — I10 Essential (primary) hypertension: Secondary | ICD-10-CM

## 2017-02-03 ENCOUNTER — Other Ambulatory Visit: Payer: Self-pay

## 2017-02-03 DIAGNOSIS — I10 Essential (primary) hypertension: Secondary | ICD-10-CM

## 2017-02-03 DIAGNOSIS — R142 Eructation: Secondary | ICD-10-CM

## 2017-02-03 MED ORDER — OMEPRAZOLE 20 MG PO CPDR: 20.0000 mg | DELAYED_RELEASE_CAPSULE | Freq: Every day | ORAL | 2 refills | Status: DC

## 2017-02-03 MED ORDER — AMLODIPINE BESYLATE 5 MG PO TABS: 5.0000 mg | ORAL_TABLET | Freq: Every day | ORAL | 2 refills | Status: DC

## 2017-02-17 HISTORY — PX: FRACTURE SURGERY: SHX138

## 2017-04-01 ENCOUNTER — Other Ambulatory Visit: Payer: Self-pay | Admitting: Family Medicine

## 2017-04-01 DIAGNOSIS — I10 Essential (primary) hypertension: Secondary | ICD-10-CM

## 2017-05-29 ENCOUNTER — Other Ambulatory Visit: Payer: Self-pay | Admitting: Nurse Practitioner

## 2017-05-29 DIAGNOSIS — R142 Eructation: Secondary | ICD-10-CM

## 2017-05-29 DIAGNOSIS — I10 Essential (primary) hypertension: Secondary | ICD-10-CM

## 2017-07-06 ENCOUNTER — Ambulatory Visit
Admission: RE | Admit: 2017-07-06 | Discharge: 2017-07-06 | Disposition: A | Discharge status: Home / Self Care | Payer: Self-pay | Source: Ambulatory Visit | Attending: Family Medicine | Admitting: Family Medicine

## 2017-07-06 ENCOUNTER — Encounter: Payer: Self-pay | Admitting: Family Medicine

## 2017-07-06 ENCOUNTER — Ambulatory Visit: Payer: Self-pay | Admitting: Family Medicine

## 2017-07-06 VITALS — BP 143/89 | HR 78 | Temp 98.6°F | Resp 16 | Ht 71.0 in | Wt 209.0 lb

## 2017-07-06 DIAGNOSIS — R058 Other specified cough: Secondary | ICD-10-CM

## 2017-07-06 DIAGNOSIS — R05 Cough: Secondary | ICD-10-CM

## 2017-07-06 DIAGNOSIS — R0781 Pleurodynia: Secondary | ICD-10-CM

## 2017-07-06 DIAGNOSIS — J0100 Acute maxillary sinusitis, unspecified: Secondary | ICD-10-CM

## 2017-07-06 DIAGNOSIS — R0989 Other specified symptoms and signs involving the circulatory and respiratory systems: Secondary | ICD-10-CM

## 2017-07-06 DIAGNOSIS — R091 Pleurisy: Secondary | ICD-10-CM | POA: Insufficient documentation

## 2017-07-06 DIAGNOSIS — R918 Other nonspecific abnormal finding of lung field: Secondary | ICD-10-CM | POA: Insufficient documentation

## 2017-07-06 MED ORDER — CEFTRIAXONE SODIUM 1 G IJ SOLR
1.0000 g | Freq: Once | INTRAMUSCULAR | Status: AC
  Administered 2017-07-06: 1 g via INTRAMUSCULAR

## 2017-07-06 MED ORDER — BENZONATATE 100 MG PO CAPS: 100.0000 mg | ORAL_CAPSULE | Freq: Three times a day (TID) | ORAL | 0 refills | Status: DC | PRN

## 2017-07-06 MED ORDER — AZITHROMYCIN 250 MG PO TABS: ORAL_TABLET | ORAL | 0 refills | Status: DC

## 2017-07-06 NOTE — Patient Instructions (Addendum)
Thank you for coming to the office today.  1. It sounds like you had an Upper Respiratory Virus that has settled into a Bronchitis, lower respiratory tract infection. I don't have concerns for pneumonia today, and think that this should gradually improve. Once you are feeling better, the cough may take a few weeks to fully resolve. I do hear some crackles in lower lung fields may be due to pneumonia or bronchitis infection  Injection of Ceftriaxone antibiotic today, and then will call you with results of X-ray and may add 2nd antibiotic pill for several days  WIll send a few other medicines to pharmacy after x-ray result - may add Tessalon Perls take 1 capsule up to 3 times a day as needed for cough. Possibly add Prednisone  daily for next 5 days - this will open up lungs allow you to breath better and treat that wheezing or bronchospasm  - Use nasal saline (Simply Saline or Ocean Spray) to flush nasal congestion multiple times a day, may help cough - Drink plenty of fluids to improve congestion  If your symptoms seem to worsen instead of improve over next several days, including significant fever / chills, worsening shortness of breath, worsening wheezing, or nausea / vomiting and can't take medicines - return sooner or go to hospital Emergency Department for more immediate treatment.  Please schedule a Follow-up Appointment to: Return in about 1 week (around 07/13/2017), or if symptoms worsen or fail to improve, for CAP vs Sinusitis.  If you have any other questions or concerns, please feel free to call the office or send a message through MyChart. You may also schedule an earlier appointment if necessary.  Additionally, you may be receiving a survey about your experience at our office within a few days to 1 week by e-mail or mail. We value your feedback.  Saralyn Pilar, DO [facility], [place]

## 2017-07-06 NOTE — Progress Notes (Signed)
Subjective:    Patient ID: Gary Schultz, male    DOB: 1965-07-19, 52 y.o.   MRN: [id number]  Gary Schultz is a 52 y.o. male presenting on 07/06/2017 for Shortness of Breath (HA, sinus, fever gettign worst SOB, cough onset week OTC not improving his Sxs, also tick bite)  Patient presents for a same day appointment.  HPI  DYSPNEA / PLEURITIC CHEST PAIN / PRODUCTIVE COUGH / FEVER Reports symptoms first started about 1 week ago, he had some mild allergies that did not affect him significantly, has seasonal allergies, recently mowed a field and it flared up symptoms, he usually takes Claritin and Sudafed, he didn't wear a mask when he mowed the field. Then he found tick on left buttocks region, was not embedded and on for < 24 hours, he did not notice rash or extending symptoms, had local irritation only - Over past weekend he had reduced energy and fatigue, and he had a fever 101.66F, Tylenol with improvement - No prior history of asthma, never smoker, no other known breathing problems - Admits thicker productive cough with thick mucus green at times - Admits sinus pain and pressure and R>L ear - Using saline flush for sinuses Denies rash from tick bite, wheezing, hemoptysis, edema, chest tightness, nausea vomiting headache   Depression screen [facility] 2/9 11/28/2016 10/24/2015 08/24/2015  Decreased Interest 0 0 0  Down, Depressed, Hopeless 0 0 0  PHQ - 2 Score 0 0 0    Social History   Tobacco Use  . Smoking status: Never Smoker  . Smokeless tobacco: Never Used  Substance Use Topics  . Alcohol use: Yes    Alcohol/week: 0.0 oz  . Drug use: No    Review of Systems Per HPI unless specifically indicated above     Objective:    BP (!) 143/89   Pulse 78   Temp 98.6 F (37 C) (Oral)   Resp 16   Ht  (1.803 m)   Wt 209 lb (94.8 kg)   SpO2 100%   BMI 29.15 kg/m   Wt Readings from Last 3 Encounters:  07/06/17 209 lb (94.8 kg)  11/28/16 211 lb 9.6 oz (96 kg)  11/04/16  210 lb (95.3 kg)    Physical Exam  Constitutional: He is oriented to person, place, and time. He appears well-developed and well-nourished. No distress.  Tired and ill appearing, uncomfortable with breathing pleuritic pain, cooperative, laying on exam table before visit started.  HENT:  Head: Normocephalic and atraumatic.  Mouth/Throat: Oropharynx is clear and moist.  R>L maxillary sinuses tender. Nares with some congestion and edema without purulence. Bilateral TMs clear with effusion but without erythema or bulging. Oropharynx clear without erythema, exudates, edema or asymmetry.   Eyes: Conjunctivae are normal. Right eye exhibits no discharge. Left eye exhibits no discharge.  Neck: Normal range of motion. Neck supple. No thyromegaly present.  Cardiovascular: Normal rate, regular rhythm, normal heart sounds and intact distal pulses.  No murmur heard. Pulmonary/Chest: Effort normal. No respiratory distress. He has decreased breath sounds in the right lower field and the left lower field. He has no wheezes. He has rales in the right lower field. He exhibits tenderness (bilateral upper anterior ribs).  Musculoskeletal: Normal range of motion. He exhibits no edema.  Lymphadenopathy:    He has no cervical adenopathy.  Neurological: He is alert and oriented to person, place, and time.  Skin: Skin is warm and dry. No rash noted. He is not diaphoretic.  No erythema.  Psychiatric: His behavior is normal.  Well groomed, good eye contact, normal speech and thoughts  Nursing note and vitals reviewed.    I have personally reviewed the radiology report from [facility] Chest X-ray on 07/06/17.  CLINICAL DATA: Chest pain. Productive cough.  EXAM: CHEST - 2 VIEW  COMPARISON: 07/10/2008  FINDINGS: Lateral view degraded by patient arm position. Mild hyperinflation. Moderate thoracic spondylosis. Right rotator cuff repair. Midline trachea. Normal heart size and mediastinal contours. No  pleural effusion or pneumothorax. Mild biapical pleural thickening. Clear lungs.  IMPRESSION: Hyperinflation, without acute disease.   Electronically Signed By: Jeronimo Greaves M.D. On: 07/06/2017 15:44  Results for orders placed or performed in visit on 11/28/16  CBC with Differential/Platelet  Result Value Ref Range   WBC 6.4 3.8 - 10.8 Thousand/uL   RBC 4.59 4.20 - 5.80 Million/uL   Hemoglobin 14.4 13.2 - 17.1 g/dL   HCT 40.9 81.1 - 91.4 %   MCV 88.7 80.0 - 100.0 fL   MCH 31.4 27.0 - 33.0 pg   MCHC 35.4 32.0 - 36.0 g/dL   RDW 78.2 95.6 - 21.3 %   Platelets 169 140 - 400 Thousand/uL   MPV 11.3 7.5 - 12.5 fL   Neutro Abs 4,045 1,500 - 7,800 cells/uL   Lymphs Abs 1,690 850 - 3,900 cells/uL   WBC mixed population 525 200 - 950 cells/uL   Eosinophils Absolute 102 15 - 500 cells/uL   Basophils Absolute 38 0 - 200 cells/uL   Neutrophils Relative % 63.2 %   Total Lymphocyte 26.4 %   Monocytes Relative 8.2 %   Eosinophils Relative 1.6 %   Basophils Relative 0.6 %  Comprehensive metabolic panel  Result Value Ref Range   Glucose, Bld 95 65 - 99 mg/dL   BUN 14 7 - 25 mg/dL   Creat 0.86 5.78 - 4.69 mg/dL   BUN/Creatinine Ratio NOT APPLICABLE 6 - 22 (calc)   Sodium 140 135 - 146 mmol/L   Potassium 4.3 3.5 - 5.3 mmol/L   Chloride 104 98 - 110 mmol/L   CO2 28 20 - 32 mmol/L   Calcium 9.3 8.6 - 10.3 mg/dL   Total Protein 6.7 6.1 - 8.1 g/dL   Albumin 4.4 3.6 - 5.1 g/dL   Globulin 2.3 1.9 - 3.7 g/dL (calc)   AG Ratio 1.9 1.0 - 2.5 (calc)   Total Bilirubin 0.5 0.2 - 1.2 mg/dL   Alkaline phosphatase (APISO) 69 40 - 115 U/L   AST 15 10 - 40 U/L   ALT 23 9 - 46 U/L  Troponin I  Result Value Ref Range   Troponin I 0.01 < OR = 0.0 ng/mL      Assessment & Plan:   Problem List Items Addressed This Visit    None    Visit Diagnoses    Acute non-recurrent maxillary sinusitis    -  Primary   Relevant Medications   cefTRIAXone (ROCEPHIN) injection 1 g (Completed)    azithromycin (ZITHROMAX Z-PAK) 250 MG tablet   benzonatate (TESSALON) 100 MG capsule   Pleuritic chest pain       Relevant Orders   DG Chest 2 View (Completed)   Productive cough       Relevant Orders   DG Chest 2 View (Completed)   Abnormal lung sounds       Relevant Orders   DG Chest 2 View (Completed)      Presumed infection with 2nd sickening with constellation of symptoms question  sinusitis vs possible CAP given possible lower lung field abnormality on exam. Not consistent with tick borne illness based on presentation course, also < 24 hour not embedded and no surrounding rash of erythema migrans are also reassuring. - Well's Score for possible PE is 0 (unlikely) - Previuosly healthy w/o known bronchitis, asthma or other respiratory concern - No recent antibiotics - Pleuritic chest pain seems more pleurisy  - Afebrile currently s/p anti pyretic, no tachycardia,  no hypoxia (100% on RA)  Plan: 1. Start empiric antibiotics with Ceftriaxone IM 1g x 1 dose in office - CHECK STAT CXR - reviewed results after patient left office with negative for acute CAP or infection, some hyperinflation - Agree to finish empiric antibiotic course rx sent later Azithromycin Z-pak - Antibiotics will cover for sinusitis vs lower respiratory infection as well - Finish Mucinex DM 7-10 days total - Start Tessalon Perls take 1 capsule up to 3 times a day as needed for cough - Improve hydration - Continue Tylenol, Ibuprofen PRN fevers - Strict return criteria given if not improving - Follow-up within 1 week if needed - consider adding stronger rx cough syrup PRN vs Prednisone   Meds ordered this encounter  Medications  . cefTRIAXone (ROCEPHIN) injection 1 g  . azithromycin (ZITHROMAX Z-PAK) 250 MG tablet    Sig: Take 2 tabs (  total) on Day 1. Take 1 tab ( ) daily for next 4 days.    Dispense:  6 tablet    Refill:  0  . benzonatate (TESSALON) 100 MG capsule    Sig: Take 1 capsule (100 mg  total) by mouth 3 (three) times daily as needed for cough.    Dispense:  30 capsule    Refill:  0    Follow up plan: Return in about 1 week (around 07/13/2017), or if symptoms worsen or fail to improve, for CAP vs Sinusitis.  Additionally patient was asked to schedule future regular follow-up, as he has only been seen by me once in 12/2015 and seen by my colleague Wilhelmina Mcardle, AGPCNP-BC once in 11/2016 - both acute visits like today.  Saralyn Pilar, DO [facility] 07/06/2017, 8:08 PM

## 2017-07-15 ENCOUNTER — Telehealth: Payer: Self-pay | Admitting: Family Medicine

## 2017-07-15 ENCOUNTER — Other Ambulatory Visit: Payer: Self-pay | Admitting: Family Medicine

## 2017-07-15 DIAGNOSIS — I10 Essential (primary) hypertension: Secondary | ICD-10-CM

## 2017-07-15 DIAGNOSIS — J9801 Acute bronchospasm: Secondary | ICD-10-CM

## 2017-07-15 MED ORDER — PREDNISONE 50 MG PO TABS: 50.0000 mg | ORAL_TABLET | Freq: Every day | ORAL | 0 refills | Status: DC

## 2017-07-15 NOTE — Telephone Encounter (Signed)
He was treated on 5/20 for lower respiratory lung infection, however chest x-ray did not confirm this. He does not need any further antibiotics.  I would offer Prednisone burst - take one pill daily for 5 days to help treat the cough and improve his respiratory status. If he is not improving after this treatment then he should either return to office for re-evaluation or go to urgent care or hospital for evaluation if needed for any worsening.  Saralyn Pilar, DO [facility] 07/15/2017, 11:55 AM

## 2017-07-15 NOTE — Telephone Encounter (Signed)
Patient advised.

## 2017-07-15 NOTE — Telephone Encounter (Signed)
Pt. Called states that he was not better states that he still have a bad cough.  Was  Requesting something else to be called in. [facility]  [place]> Pt call back # [phone]

## 2017-07-16 NOTE — Telephone Encounter (Signed)
Additionally [facility] said Prednisone cost is $10, he has no insurance. He has not picked med up yet.  Saralyn Pilar, DO [facility] 07/16/2017, 1:32 PM

## 2017-08-11 ENCOUNTER — Other Ambulatory Visit: Payer: Self-pay

## 2017-08-11 ENCOUNTER — Emergency Department: Payer: No Typology Code available for payment source

## 2017-08-11 ENCOUNTER — Encounter: Payer: Self-pay | Admitting: Emergency Medicine

## 2017-08-11 ENCOUNTER — Emergency Department
Admission: EM | Admit: 2017-08-11 | Discharge: 2017-08-11 | Disposition: A | Discharge status: Home / Self Care | Payer: No Typology Code available for payment source | Attending: Emergency Medicine | Admitting: Emergency Medicine

## 2017-08-11 DIAGNOSIS — Y990 Civilian activity done for income or pay: Secondary | ICD-10-CM | POA: Insufficient documentation

## 2017-08-11 DIAGNOSIS — I10 Essential (primary) hypertension: Secondary | ICD-10-CM | POA: Insufficient documentation

## 2017-08-11 DIAGNOSIS — M7918 Myalgia, other site: Secondary | ICD-10-CM | POA: Diagnosis not present

## 2017-08-11 DIAGNOSIS — Y9259 Other trade areas as the place of occurrence of the external cause: Secondary | ICD-10-CM | POA: Diagnosis not present

## 2017-08-11 DIAGNOSIS — Z7982 Long term (current) use of aspirin: Secondary | ICD-10-CM | POA: Diagnosis not present

## 2017-08-11 DIAGNOSIS — Y9389 Activity, other specified: Secondary | ICD-10-CM | POA: Insufficient documentation

## 2017-08-11 DIAGNOSIS — R51 Headache: Secondary | ICD-10-CM | POA: Diagnosis not present

## 2017-08-11 DIAGNOSIS — S39012A Strain of muscle, fascia and tendon of lower back, initial encounter: Secondary | ICD-10-CM | POA: Diagnosis not present

## 2017-08-11 DIAGNOSIS — S161XXA Strain of muscle, fascia and tendon at neck level, initial encounter: Secondary | ICD-10-CM | POA: Diagnosis not present

## 2017-08-11 DIAGNOSIS — Z79899 Other long term (current) drug therapy: Secondary | ICD-10-CM | POA: Diagnosis not present

## 2017-08-11 DIAGNOSIS — S0990XA Unspecified injury of head, initial encounter: Secondary | ICD-10-CM | POA: Diagnosis present

## 2017-08-11 MED ORDER — ORPHENADRINE CITRATE ER 100 MG PO TB12: 100.0000 mg | ORAL_TABLET | Freq: Two times a day (BID) | ORAL | 0 refills | Status: DC

## 2017-08-11 MED ORDER — HYDROMORPHONE HCL 1 MG/ML IJ SOLN
1.0000 mg | Freq: Once | INTRAMUSCULAR | Status: AC
  Administered 2017-08-11: 1 mg via INTRAVENOUS
  Filled 2017-08-11: qty 1

## 2017-08-11 MED ORDER — NAPROXEN 500 MG PO TABS: 500.0000 mg | ORAL_TABLET | Freq: Two times a day (BID) | ORAL | Status: DC

## 2017-08-11 MED ORDER — ONDANSETRON HCL 4 MG/2ML IJ SOLN
4.0000 mg | Freq: Once | INTRAMUSCULAR | Status: AC
  Administered 2017-08-11: 4 mg via INTRAVENOUS

## 2017-08-11 MED ORDER — ONDANSETRON HCL 4 MG/2ML IJ SOLN
INTRAMUSCULAR | Status: AC
  Administered 2017-08-11: 4 mg via INTRAVENOUS
  Filled 2017-08-11: qty 2

## 2017-08-11 MED ORDER — KETOROLAC TROMETHAMINE 30 MG/ML IJ SOLN
30.0000 mg | Freq: Once | INTRAMUSCULAR | Status: AC
  Administered 2017-08-11: 30 mg via INTRAVENOUS
  Filled 2017-08-11: qty 1
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
## 2017-08-11 NOTE — ED Notes (Signed)
Pt ambulatory to POV without difficulty. VSS. NAD. Discharge instructions, Rx and follow up discussed. All questions and concerns addressed.
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
## 2017-08-14 ENCOUNTER — Other Ambulatory Visit: Payer: Self-pay | Admitting: Nurse Practitioner

## 2017-08-14 DIAGNOSIS — R142 Eructation: Secondary | ICD-10-CM

## 2017-08-28 ENCOUNTER — Other Ambulatory Visit: Payer: Self-pay | Admitting: Nurse Practitioner

## 2017-08-28 DIAGNOSIS — I10 Essential (primary) hypertension: Secondary | ICD-10-CM

## 2017-10-13 ENCOUNTER — Ambulatory Visit: Payer: Self-pay | Admitting: Family Medicine

## 2017-10-14 ENCOUNTER — Ambulatory Visit: Payer: Self-pay | Admitting: Family Medicine

## 2017-11-30 ENCOUNTER — Other Ambulatory Visit: Payer: Self-pay | Admitting: Family Medicine

## 2017-11-30 DIAGNOSIS — R142 Eructation: Secondary | ICD-10-CM

## 2017-11-30 DIAGNOSIS — I10 Essential (primary) hypertension: Secondary | ICD-10-CM

## 2017-12-22 ENCOUNTER — Other Ambulatory Visit: Payer: Self-pay | Admitting: Nurse Practitioner

## 2017-12-22 DIAGNOSIS — I10 Essential (primary) hypertension: Secondary | ICD-10-CM

## 2017-12-22 DIAGNOSIS — R142 Eructation: Secondary | ICD-10-CM

## 2017-12-28 ENCOUNTER — Other Ambulatory Visit: Payer: Self-pay | Admitting: Family Medicine

## 2017-12-28 DIAGNOSIS — I10 Essential (primary) hypertension: Secondary | ICD-10-CM

## 2018-02-01 ENCOUNTER — Other Ambulatory Visit: Payer: Self-pay | Admitting: Family Medicine

## 2018-02-01 DIAGNOSIS — I10 Essential (primary) hypertension: Secondary | ICD-10-CM

## 2018-04-01 ENCOUNTER — Other Ambulatory Visit: Payer: Self-pay | Admitting: Family Medicine

## 2018-04-01 DIAGNOSIS — R142 Eructation: Secondary | ICD-10-CM

## 2018-04-26 ENCOUNTER — Other Ambulatory Visit: Payer: Self-pay | Admitting: Family Medicine

## 2018-04-26 DIAGNOSIS — I10 Essential (primary) hypertension: Secondary | ICD-10-CM

## 2018-07-30 ENCOUNTER — Other Ambulatory Visit: Payer: Self-pay | Admitting: Family Medicine

## 2018-07-30 ENCOUNTER — Other Ambulatory Visit (HOSPITAL_COMMUNITY): Payer: Self-pay | Admitting: Student

## 2018-07-30 ENCOUNTER — Other Ambulatory Visit: Payer: Self-pay | Admitting: Student

## 2018-07-30 DIAGNOSIS — R142 Eructation: Secondary | ICD-10-CM

## 2018-07-30 DIAGNOSIS — G8929 Other chronic pain: Secondary | ICD-10-CM

## 2018-07-30 DIAGNOSIS — M25562 Pain in left knee: Secondary | ICD-10-CM

## 2018-07-30 DIAGNOSIS — M170 Bilateral primary osteoarthritis of knee: Secondary | ICD-10-CM

## 2018-07-30 DIAGNOSIS — I10 Essential (primary) hypertension: Secondary | ICD-10-CM

## 2018-08-18 ENCOUNTER — Other Ambulatory Visit: Payer: Self-pay

## 2018-08-18 ENCOUNTER — Ambulatory Visit
Admission: RE | Admit: 2018-08-18 | Discharge: 2018-08-18 | Disposition: A | Discharge status: Home / Self Care | Payer: Self-pay | Source: Ambulatory Visit | Attending: Student | Admitting: Student

## 2018-08-18 DIAGNOSIS — M170 Bilateral primary osteoarthritis of knee: Secondary | ICD-10-CM

## 2018-08-18 DIAGNOSIS — G8929 Other chronic pain: Secondary | ICD-10-CM

## 2018-08-18 DIAGNOSIS — M25561 Pain in right knee: Secondary | ICD-10-CM | POA: Insufficient documentation

## 2018-08-18 DIAGNOSIS — M25562 Pain in left knee: Secondary | ICD-10-CM | POA: Insufficient documentation

## 2018-09-21 ENCOUNTER — Encounter
Admission: RE | Admit: 2018-09-21 | Discharge: 2018-09-21 | Disposition: A | Discharge status: Home / Self Care | Payer: Self-pay | Source: Ambulatory Visit | Attending: Surgery | Admitting: Surgery

## 2018-09-21 ENCOUNTER — Other Ambulatory Visit: Payer: Self-pay

## 2018-09-21 HISTORY — DX: Other intervertebral disc degeneration, lumbar region: M51.36

## 2018-09-21 HISTORY — DX: Gastro-esophageal reflux disease without esophagitis: K21.9

## 2018-09-21 HISTORY — DX: Cervicalgia: M54.2

## 2018-09-21 HISTORY — DX: Other intervertebral disc degeneration, lumbar region without mention of lumbar back pain or lower extremity pain: M51.369

## 2018-09-21 NOTE — Patient Instructions (Signed)
Your procedure is scheduled on: 8/11/202 Tues Report to Same Day Surgery 2nd floor medical mall [facility] ([place] Entrance-take elevator on left to 2nd floor.  Check in with surgery information desk.) To find out your arrival time please call [phone] between 1PM - 3PM on 09/27/2018 Mon  Remember: Instructions that are not followed completely may result in serious medical risk, up to and including death, or upon the discretion of your surgeon and anesthesiologist your surgery may need to be rescheduled.    _x___ 1. Do not eat food after midnight the night before your procedure. You may drink clear liquids up to 2 hours before you are scheduled to arrive at the hospital for your procedure.  Do not drink clear liquids within 2 hours of your scheduled arrival to the hospital.  Clear liquids include  --Water or Apple juice without pulp  --Clear carbohydrate beverage such as ClearFast or Gatorade  --Black Coffee or Clear Tea (No milk, no creamers, do not add anything to                  the coffee or Tea Type 1 and type 2 diabetics should only drink water.   ____Ensure clear carbohydrate drink on the way to the hospital for bariatric patients  ____Ensure clear carbohydrate drink 3 hours before surgery.   No gum chewing or hard candies.     __x__ 2. No Alcohol for 24 hours before or after surgery.   __x__3. No Smoking or e-cigarettes for 24 prior to surgery.  Do not use any chewable tobacco products for at least 6 hour prior to surgery   ____  4. Bring all medications with you on the day of surgery if instructed.    __x__ 5. Notify your doctor if there is any change in your medical condition     (cold, fever, infections).    x___6. On the morning of surgery brush your teeth with toothpaste and water.  You may rinse your mouth with mouth wash if you wish.  Do not swallow any toothpaste or mouthwash.   Do not wear jewelry, make-up, hairpins, clips or nail polish.  Do not wear lotions,  powders, or perfumes. You may wear deodorant.  Do not shave 48 hours prior to surgery. Men may shave face and neck.  Do not bring valuables to the hospital.    [facility] is not responsible for any belongings or valuables.               Contacts, dentures or bridgework may not be worn into surgery.  Leave your suitcase in the car. After surgery it may be brought to your room.  For patients admitted to the hospital, discharge time is determined by your                       treatment team.  _  Patients discharged the day of surgery will not be allowed to drive home.  You will need someone to drive you home and stay with you the night of your procedure.    Please read over the following fact sheets that you were given:   [facility] Preparing for Surgery and or MRSA Information   _x___ Take anti-hypertensive listed below, cardiac, seizure, asthma,     anti-reflux and psychiatric medicines. These include:  1. amLODipine (NORVASC) 5 MG tablet  2.gabapentin (NEURONTIN) 300 MG capsule  3.omeprazole (PRILOSEC) 20 MG capsule  4.oxyCODONE (ROXICODONE if needed  5.  6.  ____Fleets enema or Magnesium Citrate as directed.   _x___ Use CHG Soap or sage wipes as directed on instruction sheet   ____ Use inhalers on the day of surgery and bring to hospital day of surgery  ____ Stop Metformin and Janumet 2 days prior to surgery.    ____ Take 1/2 of usual insulin dose the night before surgery and none on the morning     surgery.   _x___ Follow recommendations from Cardiologist, Pulmonologist or PCP regarding          stopping Aspirin, Coumadin, Plavix ,Eliquis, Effient, or Pradaxa, and Pletal.  X____Stop Anti-inflammatories such as Advil, Aleve, Ibuprofen, Motrin, Naproxen, Naprosyn, Goodies powders or aspirin products. OK to take Tylenol and                          Celebrex.   _x___ Stop supplements until after surgery.  But may continue Vitamin D, Vitamin B,       and multivitamin.   ____  Bring C-Pap to the hospital.

## 2018-09-24 ENCOUNTER — Encounter
Admission: RE | Admit: 2018-09-24 | Discharge: 2018-09-24 | Disposition: A | Discharge status: Home / Self Care | Payer: Self-pay | Source: Ambulatory Visit | Attending: Surgery | Admitting: Surgery

## 2018-09-24 ENCOUNTER — Other Ambulatory Visit: Payer: Self-pay

## 2018-09-24 DIAGNOSIS — Z01818 Encounter for other preprocedural examination: Secondary | ICD-10-CM | POA: Insufficient documentation

## 2018-09-24 DIAGNOSIS — Z20828 Contact with and (suspected) exposure to other viral communicable diseases: Secondary | ICD-10-CM | POA: Insufficient documentation

## 2018-09-24 DIAGNOSIS — S83242A Other tear of medial meniscus, current injury, left knee, initial encounter: Secondary | ICD-10-CM | POA: Insufficient documentation

## 2018-09-24 DIAGNOSIS — M1712 Unilateral primary osteoarthritis, left knee: Secondary | ICD-10-CM | POA: Insufficient documentation

## 2018-09-24 DIAGNOSIS — I10 Essential (primary) hypertension: Secondary | ICD-10-CM | POA: Insufficient documentation

## 2018-09-24 LAB — CBC
HCT: 38.2 % — ABNORMAL LOW (ref 39.0–52.0)
Hemoglobin: 13.4 g/dL (ref 13.0–17.0)
MCH: 30.2 pg (ref 26.0–34.0)
MCHC: 35.1 g/dL (ref 30.0–36.0)
MCV: 86.2 fL (ref 80.0–100.0)
Platelets: 184 10*3/uL (ref 150–400)
RBC: 4.43 MIL/uL (ref 4.22–5.81)
RDW: 11.7 % (ref 11.5–15.5)
WBC: 8.1 10*3/uL (ref 4.0–10.5)
nRBC: 0 % (ref 0.0–0.2)

## 2018-09-24 LAB — BASIC METABOLIC PANEL
Anion gap: 9 (ref 5–15)
BUN: 17 mg/dL (ref 6–20)
CO2: 25 mmol/L (ref 22–32)
Calcium: 9.1 mg/dL (ref 8.9–10.3)
Chloride: 104 mmol/L (ref 98–111)
Creatinine, Ser: 0.91 mg/dL (ref 0.61–1.24)
GFR calc Af Amer: >60 mL/min (ref >60)
GFR calc non Af Amer: >60 mL/min (ref >60)
Glucose, Bld: 106 mg/dL — ABNORMAL HIGH (ref 70–99)
Potassium: 3.9 mmol/L (ref 3.5–5.1)
Sodium: 138 mmol/L (ref 135–145)

## 2018-09-25 LAB — SARS CORONAVIRUS 2 (TAT 6-24 HRS): SARS Coronavirus 2: NEGATIVE

## 2018-09-27 MED ORDER — CEFAZOLIN SODIUM-DEXTROSE 2-4 GM/100ML-% IV SOLN
2.0000 g | Freq: Once | INTRAVENOUS | Status: AC
  Administered 2018-09-28: 2 g via INTRAVENOUS

## 2018-09-28 ENCOUNTER — Ambulatory Visit: Payer: No Typology Code available for payment source | Admitting: Anesthesiology

## 2018-09-28 ENCOUNTER — Other Ambulatory Visit: Payer: Self-pay | Admitting: Family Medicine

## 2018-09-28 ENCOUNTER — Other Ambulatory Visit: Payer: Self-pay

## 2018-09-28 ENCOUNTER — Ambulatory Visit
Admission: RE | Admit: 2018-09-28 | Discharge: 2018-09-28 | Disposition: A | Discharge status: Home / Self Care | Payer: No Typology Code available for payment source | Attending: Surgery | Admitting: Surgery

## 2018-09-28 ENCOUNTER — Encounter
Admission: RE | Disposition: A | Discharge status: Home / Self Care | Payer: Self-pay | Source: Home / Self Care | Attending: Surgery

## 2018-09-28 DIAGNOSIS — M1712 Unilateral primary osteoarthritis, left knee: Secondary | ICD-10-CM | POA: Diagnosis not present

## 2018-09-28 DIAGNOSIS — R142 Eructation: Secondary | ICD-10-CM

## 2018-09-28 DIAGNOSIS — I10 Essential (primary) hypertension: Secondary | ICD-10-CM | POA: Diagnosis not present

## 2018-09-28 DIAGNOSIS — S83242A Other tear of medial meniscus, current injury, left knee, initial encounter: Secondary | ICD-10-CM | POA: Diagnosis present

## 2018-09-28 DIAGNOSIS — Z79899 Other long term (current) drug therapy: Secondary | ICD-10-CM | POA: Diagnosis not present

## 2018-09-28 DIAGNOSIS — K219 Gastro-esophageal reflux disease without esophagitis: Secondary | ICD-10-CM | POA: Diagnosis not present

## 2018-09-28 HISTORY — PX: KNEE ARTHROSCOPY WITH MEDIAL MENISECTOMY: SHX5651

## 2018-09-28 SURGERY — ARTHROSCOPY, KNEE, WITH MEDIAL MENISCECTOMY
Anesthesia: General | Site: Knee | Laterality: Left

## 2018-09-28 MED ORDER — OXYCODONE HCL 15 MG PO TABS: 15.0000 mg | ORAL_TABLET | ORAL | 0 refills | Status: DC | PRN

## 2018-09-28 MED ORDER — FENTANYL CITRATE (PF) 100 MCG/2ML IJ SOLN
INTRAMUSCULAR | Status: AC
  Filled 2018-09-28: qty 2

## 2018-09-28 MED ORDER — GLYCOPYRROLATE 0.2 MG/ML IJ SOLN
INTRAMUSCULAR | Status: AC
  Filled 2018-09-28: qty 1

## 2018-09-28 MED ORDER — BUPIVACAINE-EPINEPHRINE (PF) 0.5% -1:200000 IJ SOLN
INTRAMUSCULAR | Status: DC | PRN
  Administered 2018-09-28: 50 mL

## 2018-09-28 MED ORDER — ONDANSETRON HCL 4 MG/2ML IJ SOLN
INTRAMUSCULAR | Status: AC
  Filled 2018-09-28: qty 2

## 2018-09-28 MED ORDER — LIDOCAINE HCL (PF) 1 % IJ SOLN
INTRAMUSCULAR | Status: AC
  Filled 2018-09-28: qty 30

## 2018-09-28 MED ORDER — LIDOCAINE HCL (PF) 2 % IJ SOLN
INTRAMUSCULAR | Status: AC
  Filled 2018-09-28: qty 10

## 2018-09-28 MED ORDER — PHENYLEPHRINE HCL (PRESSORS) 10 MG/ML IV SOLN
INTRAVENOUS | Status: DC | PRN
  Administered 2018-09-28 (×2): 100 ug via INTRAVENOUS
  Administered 2018-09-28: 150 ug via INTRAVENOUS

## 2018-09-28 MED ORDER — GLYCOPYRROLATE 0.2 MG/ML IJ SOLN
INTRAMUSCULAR | Status: DC | PRN
  Administered 2018-09-28: 0.2 mg via INTRAVENOUS

## 2018-09-28 MED ORDER — FENTANYL CITRATE (PF) 100 MCG/2ML IJ SOLN
INTRAMUSCULAR | Status: DC | PRN
  Administered 2018-09-28: 50 ug via INTRAVENOUS
  Administered 2018-09-28 (×2): 25 ug via INTRAVENOUS

## 2018-09-28 MED ORDER — OXYCODONE HCL 5 MG/5ML PO SOLN: 5.0000 mg | Freq: Once | ORAL | Status: DC | PRN

## 2018-09-28 MED ORDER — FENTANYL CITRATE (PF) 100 MCG/2ML IJ SOLN
25.0000 ug | INTRAMUSCULAR | Status: DC | PRN
  Administered 2018-09-28 (×2): 50 ug via INTRAVENOUS

## 2018-09-28 MED ORDER — MEPERIDINE HCL 50 MG/ML IJ SOLN: 6.2500 mg | INTRAMUSCULAR | Status: DC | PRN

## 2018-09-28 MED ORDER — DEXAMETHASONE SODIUM PHOSPHATE 10 MG/ML IJ SOLN
INTRAMUSCULAR | Status: AC
  Filled 2018-09-28: qty 1

## 2018-09-28 MED ORDER — LIDOCAINE HCL 1 % IJ SOLN
INTRAMUSCULAR | Status: DC | PRN
  Administered 2018-09-28: 30 mL

## 2018-09-28 MED ORDER — BUPIVACAINE-EPINEPHRINE (PF) 0.5% -1:200000 IJ SOLN
INTRAMUSCULAR | Status: AC
  Filled 2018-09-28: qty 60

## 2018-09-28 MED ORDER — EPHEDRINE SULFATE 50 MG/ML IJ SOLN
INTRAMUSCULAR | Status: DC | PRN
  Administered 2018-09-28: 5 mg via INTRAVENOUS

## 2018-09-28 MED ORDER — OXYCODONE HCL 5 MG PO TABS
ORAL_TABLET | ORAL | Status: AC
  Filled 2018-09-28: qty 3

## 2018-09-28 MED ORDER — KETAMINE HCL 50 MG/ML IJ SOLN
INTRAMUSCULAR | Status: DC | PRN
  Administered 2018-09-28: 100 mg via INTRAVENOUS

## 2018-09-28 MED ORDER — PROPOFOL 10 MG/ML IV BOLUS
INTRAVENOUS | Status: DC | PRN
  Administered 2018-09-28: 200 mg via INTRAVENOUS

## 2018-09-28 MED ORDER — MIDAZOLAM HCL 2 MG/2ML IJ SOLN
INTRAMUSCULAR | Status: DC | PRN
  Administered 2018-09-28: 2 mg via INTRAVENOUS

## 2018-09-28 MED ORDER — PROMETHAZINE HCL 25 MG/ML IJ SOLN: 6.2500 mg | INTRAMUSCULAR | Status: DC | PRN

## 2018-09-28 MED ORDER — OXYCODONE HCL 5 MG PO TABS
15.0000 mg | ORAL_TABLET | Freq: Once | ORAL | Status: AC
  Administered 2018-09-28: 15 mg via ORAL

## 2018-09-28 MED ORDER — LIDOCAINE HCL (CARDIAC) PF 100 MG/5ML IV SOSY
PREFILLED_SYRINGE | INTRAVENOUS | Status: DC | PRN
  Administered 2018-09-28: 100 mg via INTRAVENOUS

## 2018-09-28 MED ORDER — CEFAZOLIN SODIUM-DEXTROSE 2-4 GM/100ML-% IV SOLN
INTRAVENOUS | Status: AC
  Filled 2018-09-28: qty 100

## 2018-09-28 MED ORDER — ONDANSETRON HCL 4 MG/2ML IJ SOLN
INTRAMUSCULAR | Status: DC | PRN
  Administered 2018-09-28: 4 mg via INTRAVENOUS

## 2018-09-28 MED ORDER — OXYCODONE HCL 5 MG PO TABS: 5.0000 mg | ORAL_TABLET | Freq: Once | ORAL | Status: DC | PRN

## 2018-09-28 MED ORDER — DEXAMETHASONE SODIUM PHOSPHATE 4 MG/ML IJ SOLN
INTRAMUSCULAR | Status: DC | PRN
  Administered 2018-09-28: 10 mg via INTRAVENOUS

## 2018-09-28 MED ORDER — PROPOFOL 10 MG/ML IV BOLUS
INTRAVENOUS | Status: AC
  Filled 2018-09-28: qty 20

## 2018-09-28 MED ORDER — LACTATED RINGERS IV SOLN
INTRAVENOUS | Status: DC
  Administered 2018-09-28: 13:00:00 via INTRAVENOUS

## 2018-09-28 MED ORDER — MIDAZOLAM HCL 2 MG/2ML IJ SOLN
INTRAMUSCULAR | Status: AC
  Filled 2018-09-28: qty 2

## 2018-09-28 SURGICAL SUPPLY — 37 items
BAG COUNTER SPONGE EZ (MISCELLANEOUS) IMPLANT
BLADE FULL RADIUS 3.5 (BLADE) ×2 IMPLANT
BLADE SHAVER 4.5X7 STR FR (MISCELLANEOUS) IMPLANT
BNDG ELASTIC 6X5.8 VLCR STR LF (GAUZE/BANDAGES/DRESSINGS) ×2 IMPLANT
CHLORAPREP W/TINT 26 (MISCELLANEOUS) ×2 IMPLANT
COVER WAND RF STERILE (DRAPES) ×2 IMPLANT
CUFF TOURN SGL QUICK 24 (TOURNIQUET CUFF)
CUFF TOURN SGL QUICK 30 (TOURNIQUET CUFF) ×1
CUFF TRNQT CYL 24X4X16.5-23 (TOURNIQUET CUFF) IMPLANT
CUFF TRNQT CYL 30X4X21-28X (TOURNIQUET CUFF) ×1 IMPLANT
DRAPE SPLIT 6X30 W/TAPE (DRAPES) ×2 IMPLANT
ELECT REM PT RETURN 9FT ADLT (ELECTROSURGICAL) ×2
ELECTRODE REM PT RTRN 9FT ADLT (ELECTROSURGICAL) ×1 IMPLANT
GAUZE SPONGE 4X4 12PLY STRL (GAUZE/BANDAGES/DRESSINGS) ×2 IMPLANT
GLOVE BIO SURGEON STRL SZ 6.5 (GLOVE) ×2 IMPLANT
GLOVE BIO SURGEON STRL SZ8 (GLOVE) ×4 IMPLANT
GLOVE BIOGEL M 7.0 STRL (GLOVE) ×4 IMPLANT
GLOVE BIOGEL PI IND STRL 7.5 (GLOVE) ×1 IMPLANT
GLOVE BIOGEL PI INDICATOR 7.5 (GLOVE) ×1
GLOVE INDICATOR 7.0 STRL GRN (GLOVE) ×2 IMPLANT
GLOVE INDICATOR 8.0 STRL GRN (GLOVE) ×2 IMPLANT
GOWN STRL REUS W/ TWL LRG LVL3 (GOWN DISPOSABLE) ×1 IMPLANT
GOWN STRL REUS W/ TWL XL LVL3 (GOWN DISPOSABLE) ×2 IMPLANT
GOWN STRL REUS W/TWL LRG LVL3 (GOWN DISPOSABLE) ×1
GOWN STRL REUS W/TWL XL LVL3 (GOWN DISPOSABLE) ×2
IV LACTATED RINGER IRRG 3000ML (IV SOLUTION) ×1
IV LR IRRIG 3000ML ARTHROMATIC (IV SOLUTION) ×1 IMPLANT
KIT TURNOVER KIT A (KITS) ×2 IMPLANT
MANIFOLD NEPTUNE II (INSTRUMENTS) ×2 IMPLANT
NEEDLE HYPO 21X1.5 SAFETY (NEEDLE) ×2 IMPLANT
PACK ARTHROSCOPY KNEE (MISCELLANEOUS) ×2 IMPLANT
PENCIL ELECTRO HAND CTR (MISCELLANEOUS) ×2 IMPLANT
SUT PROLENE 4 0 PS 2 18 (SUTURE) ×2 IMPLANT
SUT TICRON COATED BLUE 2 0 30 (SUTURE) IMPLANT
SYR 50ML LL SCALE MARK (SYRINGE) ×2 IMPLANT
TUBING ARTHRO INFLOW-ONLY STRL (TUBING) ×2 IMPLANT
WAND WEREWOLF FLOW 90D (MISCELLANEOUS) ×2 IMPLANT

## 2018-09-28 NOTE — H&P (Signed)
Paper H&P to be scanned into permanent record. H&P reviewed and patient re-examined. No changes. 
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
## 2018-09-28 NOTE — Anesthesia Postprocedure Evaluation (Signed)
Anesthesia Post Note  Patient: CRESTON KLAS  Procedure(s) Performed: KNEE ARTHROSCOPY WITH DEBRIDEMENT, abrasion chondroplasty of patella, PARTIAL MEDIAL MENISCECTOMY (Left Knee)  Patient location during evaluation: PACU Anesthesia Type: General Level of consciousness: awake and alert Pain management: pain level controlled Vital Signs Assessment: post-procedure vital signs reviewed and stable Respiratory status: spontaneous breathing, nonlabored ventilation, respiratory function stable and patient connected to nasal cannula oxygen Cardiovascular status: blood pressure returned to baseline and stable Postop Assessment: no apparent nausea or vomiting Anesthetic complications: no     Last Vitals:  Vitals:   09/28/18 1630 09/28/18 1713  BP: (!) 166/99 (!) 142/89  Pulse: 70   Resp: 20 18  Temp: (!) 36.4 C   SpO2: 100% 99%    Last Pain:  Vitals:   09/28/18 1630  TempSrc: Temporal  PainSc: 5                  Martha Clan

## 2018-09-28 NOTE — Anesthesia Procedure Notes (Signed)
Procedure Name: LMA Insertion Date/Time: 09/28/2018 2:31 PM Performed by: Bernardo Heater, CRNA Pre-anesthesia Checklist: Patient identified, Emergency Drugs available, Suction available and Patient being monitored Patient Re-evaluated:Patient Re-evaluated prior to induction Oxygen Delivery Method: Circle system utilized Preoxygenation: Pre-oxygenation with 100% oxygen Induction Type: IV induction LMA Size: 5.0 Placement Confirmation: breath sounds checked- equal and bilateral and positive ETCO2 Tube secured with: Tape Dental Injury: Teeth and Oropharynx as per pre-operative assessment

## 2018-09-28 NOTE — Anesthesia Preprocedure Evaluation (Signed)
Anesthesia Evaluation  Patient identified by MRN, date of birth, ID band Patient awake    Reviewed: Allergy & Precautions, NPO status , Patient's Chart, lab work & pertinent test results  History of Anesthesia Complications Negative for: history of anesthetic complications  Airway Mallampati: II  TM Distance: >3 FB Neck ROM: Full    Dental no notable dental hx.    Pulmonary neg pulmonary ROS, neg sleep apnea, neg COPD,    breath sounds clear to auscultation- rhonchi (-) wheezing      Cardiovascular hypertension, Pt. on medications (-) CAD, (-) Past MI, (-) Cardiac Stents and (-) CABG  Rhythm:Regular Rate:Normal - Systolic murmurs and - Diastolic murmurs    Neuro/Psych  Headaches, neg Seizures Anxiety    GI/Hepatic Neg liver ROS, GERD  ,  Endo/Other  negative endocrine ROSneg diabetes  Renal/GU negative Renal ROS     Musculoskeletal  (+) Arthritis ,   Abdominal (+) - obese,   Peds  Hematology negative hematology ROS (+)   Anesthesia Other Findings Past Medical History: No date: Anxiety No date: Carpal tunnel syndrome     Comment:  bilateral No date: DDD (degenerative disc disease), lumbar No date: Generalized headaches     Comment:  from neck injury No date: GERD (gastroesophageal reflux disease) No date: Hypertension No date: Neck pain No date: Neuromuscular disorder (HCC) No date: Varicose veins   Reproductive/Obstetrics                             Anesthesia Physical Anesthesia Plan  ASA: II  Anesthesia Plan: General   Post-op Pain Management:    Induction: Intravenous  PONV Risk Score and Plan: 1 and Ondansetron, Dexamethasone and Midazolam  Airway Management Planned: LMA  Additional Equipment:   Intra-op Plan:   Post-operative Plan:   Informed Consent: I have reviewed the patients History and Physical, chart, labs and discussed the procedure including the risks,  benefits and alternatives for the proposed anesthesia with the patient or authorized representative who has indicated his/her understanding and acceptance.     Dental advisory given  Plan Discussed with: CRNA and Anesthesiologist  Anesthesia Plan Comments:         Anesthesia Quick Evaluation

## 2018-09-28 NOTE — Discharge Instructions (Addendum)
AMBULATORY SURGERY  DISCHARGE INSTRUCTIONS   1) The drugs that you were given will stay in your system until tomorrow so for the next 24 hours you should not:  A) Drive an automobile B) Make any legal decisions C) Drink any alcoholic beverage   2) You may resume regular meals tomorrow.  Today it is better to start with liquids and gradually work up to solid foods.  You may eat anything you prefer, but it is better to start with liquids, then soup and crackers, and gradually work up to solid foods.   3) Please notify your doctor immediately if you have any unusual bleeding, trouble breathing, redness and pain at the surgery site, drainage, fever, or pain not relieved by medication.    4) Additional Instructions:Call on [facility] for post op appt        Please contact your physician with any problems or Same Day Surgery at [phone], Monday through Friday 6 am to 4 pm, or [place] at [facility] number at [phone].Orthopedic discharge instructions: Keep dressing dry and intact.  May shower after dressing changed on post-op day #4 (Saturday).  Cover sutures with Band-Aids after drying off. Apply ice frequently to knee. Take ibuprofen 600-800 mg TID with meals for 7-10 days, then as necessary. Take pain medication as prescribed or ES Tylenol when needed.  May weight-bear as tolerated - use crutches or walker as needed. Follow-up in 10-14 days or as scheduled.

## 2018-09-28 NOTE — Anesthesia Post-op Follow-up Note (Signed)
Anesthesia QCDR form completed.        

## 2018-09-28 NOTE — Transfer of Care (Signed)
Immediate Anesthesia Transfer of Care Note  Patient: Gary Schultz  Procedure(s) Performed: KNEE ARTHROSCOPY WITH DEBRIDEMENT, abrasion chondroplasty of patella, PARTIAL MEDIAL MENISCECTOMY (Left Knee)  Patient Location: PACU  Anesthesia Type:General  Level of Consciousness: awake  Airway & Oxygen Therapy: Patient Spontanous Breathing and Patient connected to face mask oxygen  Post-op Assessment: Report given to RN and Post -op Vital signs reviewed and stable  Post vital signs: stable  Last Vitals:  Vitals Value Taken Time  BP 150/100 09/28/18 1539  Temp    Pulse 93 09/28/18 1540  Resp 17 09/28/18 1540  SpO2 98 % 09/28/18 1540  Vitals shown include unvalidated device data.  Last Pain:  Vitals:   09/28/18 1218  TempSrc: Temporal  PainSc: 7       Patients Stated Pain Goal: 3 (77/41/42 3953)  Complications: No apparent anesthesia complications

## 2018-10-28 ENCOUNTER — Other Ambulatory Visit: Payer: Self-pay | Admitting: Family Medicine

## 2018-10-28 DIAGNOSIS — I10 Essential (primary) hypertension: Secondary | ICD-10-CM

## 2018-12-01 ENCOUNTER — Other Ambulatory Visit: Payer: Self-pay

## 2018-12-01 ENCOUNTER — Encounter
Admission: RE | Admit: 2018-12-01 | Discharge: 2018-12-01 | Disposition: A | Discharge status: Home / Self Care | Payer: No Typology Code available for payment source | Source: Ambulatory Visit | Attending: Surgery | Admitting: Surgery

## 2018-12-01 NOTE — Patient Instructions (Signed)
Your procedure is scheduled on: Tuesday December 07, 2018  Report to Day Surgery on the 2nd floor of the [facility]. To find out your arrival time, please call [phone] between 1PM - 3PM on: Monday December 06, 2018  REMEMBER: Instructions that are not followed completely may result in serious medical risk, up to and including death; or upon the discretion of your surgeon and anesthesiologist your surgery may need to be rescheduled.  Do not eat food after midnight the night before surgery.  No gum chewing, lozengers or hard candies.  You may however, drink CLEAR liquids up to 2 hours before you are scheduled to arrive for your surgery. Do not drink anything within 2 hours of the start of your surgery.  Clear liquids include: - water  - apple juice without pulp -clear  gatorade - black coffee or tea (Do NOT add milk or creamers to the coffee or tea) Do NOT drink anything that is not on this list.  No Alcohol for 24 hours before or after surgery.  No Smoking including e-cigarettes for 24 hours prior to surgery.  No chewable tobacco products for at least 6 hours prior to surgery.  No nicotine patches on the day of surgery.  On the morning of surgery brush your teeth with toothpaste and water, you may rinse your mouth with mouthwash if you wish. Do not swallow any toothpaste or mouthwash.  Notify your doctor if there is any change in your medical condition (cold, fever, infection).  Do not wear jewelry, make-up, hairpins, clips or nail polish.  Do not wear lotions, powders, or perfumes.   Do not shave 48 hours prior to surgery.   Contacts and dentures may not be worn into surgery.  Do not bring valuables to the hospital, including drivers license, insurance or credit cards.  [facility] is not responsible for any belongings or valuables.   TAKE THESE MEDICATIONS THE MORNING OF SURGERY: Amlodipine  omeprazole (take one the night before and one on the morning of surgery -  helps to prevent nausea after surgery.)  Do not take Lisinopril day of surgery  Use CHG Soap  as directed on instruction sheet.  Stop Anti-inflammatories (NSAIDS) such as Advil, Aleve, Ibuprofen, Motrin, Naproxen, Naprosyn and Aspirin based products such as Excedrin, Goodys Powder, BC Powder. (May take Tylenol or Acetaminophen if needed.)  Stop ANY OVER THE COUNTER supplements until after surgery- ginseng and turmeric (May continue Vitamin D, Vitamin B, and multivitamin.)  Wear comfortable clothing (specific to your surgery type) to the hospital.  Plan for stool softeners for home use.  If you are being discharged the day of surgery, you will not be allowed to drive home. You will need a responsible adult to drive you home and stay with you that night.   If you are taking public transportation, you will need to have a responsible adult with you. Please confirm with your physician that it is acceptable to use public transportation.   Please call [phone] if you have any questions about these instructions.

## 2018-12-03 ENCOUNTER — Other Ambulatory Visit: Payer: Self-pay

## 2018-12-03 ENCOUNTER — Other Ambulatory Visit
Admission: RE | Admit: 2018-12-03 | Discharge: 2018-12-03 | Disposition: A | Discharge status: Home / Self Care | Payer: Self-pay | Source: Ambulatory Visit | Attending: Surgery | Admitting: Surgery

## 2018-12-03 DIAGNOSIS — Z20828 Contact with and (suspected) exposure to other viral communicable diseases: Secondary | ICD-10-CM | POA: Diagnosis not present

## 2018-12-03 LAB — SARS CORONAVIRUS 2 (TAT 6-24 HRS): SARS Coronavirus 2: NEGATIVE

## 2018-12-07 ENCOUNTER — Ambulatory Visit: Payer: Self-pay | Admitting: Anesthesiology

## 2018-12-07 ENCOUNTER — Ambulatory Visit
Admission: RE | Admit: 2018-12-07 | Discharge: 2018-12-07 | Disposition: A | Discharge status: Home / Self Care | Payer: Self-pay | Attending: Surgery | Admitting: Surgery

## 2018-12-07 ENCOUNTER — Other Ambulatory Visit: Payer: Self-pay

## 2018-12-07 ENCOUNTER — Encounter: Payer: Self-pay | Admitting: *Deleted

## 2018-12-07 ENCOUNTER — Encounter
Admission: RE | Disposition: A | Discharge status: Home / Self Care | Payer: Self-pay | Source: Home / Self Care | Attending: Surgery

## 2018-12-07 DIAGNOSIS — X58XXXA Exposure to other specified factors, initial encounter: Secondary | ICD-10-CM | POA: Insufficient documentation

## 2018-12-07 DIAGNOSIS — I10 Essential (primary) hypertension: Secondary | ICD-10-CM | POA: Insufficient documentation

## 2018-12-07 DIAGNOSIS — M94261 Chondromalacia, right knee: Secondary | ICD-10-CM | POA: Insufficient documentation

## 2018-12-07 DIAGNOSIS — Z79899 Other long term (current) drug therapy: Secondary | ICD-10-CM | POA: Insufficient documentation

## 2018-12-07 DIAGNOSIS — K219 Gastro-esophageal reflux disease without esophagitis: Secondary | ICD-10-CM | POA: Insufficient documentation

## 2018-12-07 DIAGNOSIS — M1711 Unilateral primary osteoarthritis, right knee: Secondary | ICD-10-CM | POA: Insufficient documentation

## 2018-12-07 DIAGNOSIS — S83231A Complex tear of medial meniscus, current injury, right knee, initial encounter: Secondary | ICD-10-CM | POA: Insufficient documentation

## 2018-12-07 HISTORY — PX: KNEE ARTHROSCOPY WITH MEDIAL MENISECTOMY: SHX5651

## 2018-12-07 SURGERY — ARTHROSCOPY, KNEE, WITH MEDIAL MENISCECTOMY
Anesthesia: General | Site: Knee | Laterality: Right

## 2018-12-07 MED ORDER — BUPIVACAINE-EPINEPHRINE (PF) 0.5% -1:200000 IJ SOLN
INTRAMUSCULAR | Status: DC | PRN
  Administered 2018-12-07: 60 mL via PERINEURAL

## 2018-12-07 MED ORDER — FENTANYL CITRATE (PF) 100 MCG/2ML IJ SOLN
50.0000 ug | Freq: Once | INTRAMUSCULAR | Status: AC
  Administered 2018-12-07: 50 ug via INTRAVENOUS

## 2018-12-07 MED ORDER — LACTATED RINGERS IV SOLN
INTRAVENOUS | Status: DC
  Administered 2018-12-07: 11:00:00 via INTRAVENOUS

## 2018-12-07 MED ORDER — FENTANYL CITRATE (PF) 100 MCG/2ML IJ SOLN
INTRAMUSCULAR | Status: AC
  Filled 2018-12-07: qty 2

## 2018-12-07 MED ORDER — FENTANYL CITRATE (PF) 100 MCG/2ML IJ SOLN
25.0000 ug | INTRAMUSCULAR | Status: AC | PRN
  Administered 2018-12-07 (×6): 25 ug via INTRAVENOUS

## 2018-12-07 MED ORDER — CEFAZOLIN SODIUM-DEXTROSE 2-4 GM/100ML-% IV SOLN
2.0000 g | Freq: Once | INTRAVENOUS | Status: AC
  Administered 2018-12-07: 2 g via INTRAVENOUS

## 2018-12-07 MED ORDER — LIDOCAINE HCL (PF) 2 % IJ SOLN
INTRAMUSCULAR | Status: AC
  Filled 2018-12-07: qty 10

## 2018-12-07 MED ORDER — OXYCODONE HCL 5 MG PO TABS
ORAL_TABLET | ORAL | Status: AC
  Filled 2018-12-07: qty 1

## 2018-12-07 MED ORDER — SODIUM CHLORIDE 0.9 % IV SOLN
INTRAVENOUS | Status: DC | PRN
  Administered 2018-12-07: 30 ug/min via INTRAVENOUS

## 2018-12-07 MED ORDER — PROMETHAZINE HCL 25 MG/ML IJ SOLN: 6.2500 mg | INTRAMUSCULAR | Status: DC | PRN

## 2018-12-07 MED ORDER — FENTANYL CITRATE (PF) 100 MCG/2ML IJ SOLN
INTRAMUSCULAR | Status: AC
  Administered 2018-12-07: 25 ug via INTRAVENOUS
  Filled 2018-12-07: qty 2

## 2018-12-07 MED ORDER — PROPOFOL 10 MG/ML IV BOLUS
INTRAVENOUS | Status: DC | PRN
  Administered 2018-12-07: 180 mg via INTRAVENOUS

## 2018-12-07 MED ORDER — CEFAZOLIN SODIUM-DEXTROSE 2-4 GM/100ML-% IV SOLN
INTRAVENOUS | Status: AC
  Filled 2018-12-07: qty 100

## 2018-12-07 MED ORDER — DEXAMETHASONE SODIUM PHOSPHATE 4 MG/ML IJ SOLN
INTRAMUSCULAR | Status: DC | PRN
  Administered 2018-12-07: 8 mg via INTRAVENOUS

## 2018-12-07 MED ORDER — ONDANSETRON HCL 4 MG/2ML IJ SOLN
INTRAMUSCULAR | Status: DC | PRN
  Administered 2018-12-07: 4 mg via INTRAVENOUS

## 2018-12-07 MED ORDER — ONDANSETRON HCL 4 MG/2ML IJ SOLN: 4.0000 mg | Freq: Four times a day (QID) | INTRAMUSCULAR | Status: DC | PRN

## 2018-12-07 MED ORDER — ACETAMINOPHEN 10 MG/ML IV SOLN
INTRAVENOUS | Status: AC
  Filled 2018-12-07: qty 100

## 2018-12-07 MED ORDER — DEXAMETHASONE SODIUM PHOSPHATE 10 MG/ML IJ SOLN
INTRAMUSCULAR | Status: AC
  Filled 2018-12-07: qty 1

## 2018-12-07 MED ORDER — LIDOCAINE HCL 1 % IJ SOLN
INTRAMUSCULAR | Status: DC | PRN
  Administered 2018-12-07: 30 mL

## 2018-12-07 MED ORDER — ONDANSETRON HCL 4 MG/2ML IJ SOLN
INTRAMUSCULAR | Status: AC
  Filled 2018-12-07: qty 2

## 2018-12-07 MED ORDER — PROPOFOL 10 MG/ML IV BOLUS
INTRAVENOUS | Status: AC
  Filled 2018-12-07: qty 20

## 2018-12-07 MED ORDER — FENTANYL CITRATE (PF) 100 MCG/2ML IJ SOLN
INTRAMUSCULAR | Status: DC | PRN
  Administered 2018-12-07: 100 ug via INTRAVENOUS

## 2018-12-07 MED ORDER — OXYCODONE HCL 5 MG PO TABS
10.0000 mg | ORAL_TABLET | ORAL | Status: DC | PRN
  Filled 2018-12-07: qty 2

## 2018-12-07 MED ORDER — MIDAZOLAM HCL 2 MG/2ML IJ SOLN
INTRAMUSCULAR | Status: AC
  Filled 2018-12-07: qty 2

## 2018-12-07 MED ORDER — GLYCOPYRROLATE 0.2 MG/ML IJ SOLN
INTRAMUSCULAR | Status: DC | PRN
  Administered 2018-12-07: 0.2 mg via INTRAVENOUS

## 2018-12-07 MED ORDER — LIDOCAINE HCL (CARDIAC) PF 100 MG/5ML IV SOSY
PREFILLED_SYRINGE | INTRAVENOUS | Status: DC | PRN
  Administered 2018-12-07: 100 mg via INTRAVENOUS

## 2018-12-07 MED ORDER — MIDAZOLAM HCL 2 MG/2ML IJ SOLN
INTRAMUSCULAR | Status: DC | PRN
  Administered 2018-12-07: 2 mg via INTRAVENOUS

## 2018-12-07 MED ORDER — ONDANSETRON HCL 4 MG PO TABS: 4.0000 mg | ORAL_TABLET | Freq: Four times a day (QID) | ORAL | Status: DC | PRN

## 2018-12-07 MED ORDER — OXYCODONE HCL 5 MG PO TABS
5.0000 mg | ORAL_TABLET | Freq: Once | ORAL | Status: AC | PRN
  Administered 2018-12-07: 5 mg via ORAL

## 2018-12-07 MED ORDER — POTASSIUM CHLORIDE IN NACL 20-0.9 MEQ/L-% IV SOLN
INTRAVENOUS | Status: DC
  Filled 2018-12-07: qty 1000

## 2018-12-07 MED ORDER — KETAMINE HCL 50 MG/ML IJ SOLN
INTRAMUSCULAR | Status: AC
  Filled 2018-12-07: qty 10

## 2018-12-07 MED ORDER — OXYCODONE HCL 15 MG PO TABS: 15.0000 mg | ORAL_TABLET | Freq: Four times a day (QID) | ORAL | 0 refills | Status: DC | PRN

## 2018-12-07 MED ORDER — METOCLOPRAMIDE HCL 5 MG/ML IJ SOLN: 5.0000 mg | Freq: Three times a day (TID) | INTRAMUSCULAR | Status: DC | PRN

## 2018-12-07 MED ORDER — KETAMINE HCL 50 MG/ML IJ SOLN
INTRAMUSCULAR | Status: DC | PRN
  Administered 2018-12-07: 50 mg via INTRAVENOUS

## 2018-12-07 MED ORDER — ACETAMINOPHEN 10 MG/ML IV SOLN
INTRAVENOUS | Status: DC | PRN
  Administered 2018-12-07: 1000 mg via INTRAVENOUS

## 2018-12-07 MED ORDER — OXYCODONE HCL 5 MG/5ML PO SOLN: 5.0000 mg | Freq: Once | ORAL | Status: AC | PRN

## 2018-12-07 MED ORDER — METOCLOPRAMIDE HCL 10 MG PO TABS: 5.0000 mg | ORAL_TABLET | Freq: Three times a day (TID) | ORAL | Status: DC | PRN

## 2018-12-07 MED ORDER — MEPERIDINE HCL 50 MG/ML IJ SOLN: 6.2500 mg | INTRAMUSCULAR | Status: DC | PRN

## 2018-12-07 SURGICAL SUPPLY — 44 items
"PENCIL ELECTRO HAND CTR " (MISCELLANEOUS) ×1 IMPLANT
BLADE FULL RADIUS 3.5 (BLADE) ×2 IMPLANT
BLADE SHAVER 4.5X7 STR FR (MISCELLANEOUS) ×2 IMPLANT
BNDG ELASTIC 6X5.8 VLCR STR LF (GAUZE/BANDAGES/DRESSINGS) ×2 IMPLANT
CARTRIDGE SUT 2-0 NONSTITCH (Anchor) ×1 IMPLANT
CHLORAPREP W/TINT 26 (MISCELLANEOUS) ×2 IMPLANT
COVER WAND RF STERILE (DRAPES) ×2 IMPLANT
CUFF TOURN SGL QUICK 24 (TOURNIQUET CUFF)
CUFF TOURN SGL QUICK 30 (TOURNIQUET CUFF) ×1
CUFF TRNQT CYL 24X4X16.5-23 (TOURNIQUET CUFF) IMPLANT
CUFF TRNQT CYL 30X4X21-28X (TOURNIQUET CUFF) IMPLANT
DRAPE SPLIT 6X30 W/TAPE (DRAPES) ×2 IMPLANT
ELECT REM PT RETURN 9FT ADLT (ELECTROSURGICAL)
ELECTRODE REM PT RTRN 9FT ADLT (ELECTROSURGICAL) ×1 IMPLANT
FASTFIX NDL DEL SYS 360 CVD (Miscellaneous) ×4 IMPLANT
GAUZE SPONGE 4X4 12PLY STRL (GAUZE/BANDAGES/DRESSINGS) ×2 IMPLANT
GLOVE BIO SURGEON STRL SZ8 (GLOVE) ×4 IMPLANT
GLOVE BIOGEL M 7.0 STRL (GLOVE) ×4 IMPLANT
GLOVE BIOGEL PI IND STRL 7.5 (GLOVE) ×1 IMPLANT
GLOVE BIOGEL PI INDICATOR 7.5 (GLOVE) ×1
GLOVE INDICATOR 8.0 STRL GRN (GLOVE) ×2 IMPLANT
GOWN STRL REUS W/ TWL LRG LVL3 (GOWN DISPOSABLE) ×1 IMPLANT
GOWN STRL REUS W/ TWL XL LVL3 (GOWN DISPOSABLE) ×2 IMPLANT
GOWN STRL REUS W/TWL LRG LVL3 (GOWN DISPOSABLE) ×1
GOWN STRL REUS W/TWL XL LVL3 (GOWN DISPOSABLE) ×2
IV LACTATED RINGER IRRG 3000ML (IV SOLUTION) ×1
IV LR IRRIG 3000ML ARTHROMATIC (IV SOLUTION) ×1 IMPLANT
KIT TURNOVER KIT A (KITS) ×2 IMPLANT
MANAGER SUT NOVOCUT (CUTTER) ×1 IMPLANT
MANIFOLD NEPTUNE II (INSTRUMENTS) ×2 IMPLANT
NDL HYPO 21X1.5 SAFETY (NEEDLE) ×1 IMPLANT
NEEDLE HYPO 21X1.5 SAFETY (NEEDLE) ×2 IMPLANT
NOVOSTICH PRO MENISCAL 2-0 (Miscellaneous) ×2 IMPLANT
PACK ARTHROSCOPY KNEE (MISCELLANEOUS) ×2 IMPLANT
PENCIL ELECTRO HAND CTR (MISCELLANEOUS) ×1 IMPLANT
PUSHER KNOT ARTHRO STRT FASTFI (MISCELLANEOUS) ×1 IMPLANT
SLEEVE PROTECTION STRL DISP (MISCELLANEOUS) ×1 IMPLANT
SUT PROLENE 4 0 PS 2 18 (SUTURE) ×1 IMPLANT
SUT TICRON COATED BLUE 2 0 30 (SUTURE) IMPLANT
SYR 50ML LL SCALE MARK (SYRINGE) ×2 IMPLANT
SYSTEM NDL DEL FSTFX  360 CVD (Miscellaneous) IMPLANT
SYSTEM NVSTCH PRO MENISCAL 2-0 (Miscellaneous) IMPLANT
TUBING ARTHRO INFLOW-ONLY STRL (TUBING) ×2 IMPLANT
WAND WEREWOLF FLOW 90D (MISCELLANEOUS) ×2 IMPLANT

## 2018-12-07 NOTE — Discharge Instructions (Addendum)
AMBULATORY SURGERY  DISCHARGE INSTRUCTIONS   1) The drugs that you were given will stay in your system until tomorrow so for the next 24 hours you should not:  A) Drive an automobile B) Make any legal decisions C) Drink any alcoholic beverage   2) You may resume regular meals tomorrow.  Today it is better to start with liquids and gradually work up to solid foods.  You may eat anything you prefer, but it is better to start with liquids, then soup and crackers, and gradually work up to solid foods.   3) Please notify your doctor immediately if you have any unusual bleeding, trouble breathing, redness and pain at the surgery site, drainage, fever, or pain not relieved by medication.  4) Your post-operative visit with Dr.                                     is: Date:                        Time:    Please call to schedule your post-operative visit.  5) Additional Instructions: Orthopedic discharge instructions: Keep dressing dry and intact.  May shower after dressing changed on post-op day #4 (Saturday).  Cover sutures with Band-Aids after drying off. Apply ice frequently to knee. Take ibuprofen 600-800 mg TID with meals for 7-10 days, then as necessary. Take pain medication as prescribed when needed.  May weight-bear as tolerated - use crutches or walker as needed. Follow-up in 10-14 days or as scheduled.

## 2018-12-07 NOTE — Anesthesia Procedure Notes (Signed)
Procedure Name: LMA Insertion Date/Time: 12/07/2018 1:43 PM Performed by: Bernardo Heater, CRNA Pre-anesthesia Checklist: Patient identified, Emergency Drugs available, Suction available and Patient being monitored Patient Re-evaluated:Patient Re-evaluated prior to induction Oxygen Delivery Method: Circle system utilized Preoxygenation: Pre-oxygenation with 100% oxygen Induction Type: IV induction Ventilation: Mask ventilation without difficulty LMA: LMA inserted LMA Size: 5.0 Tube type: Oral Number of attempts: 1 Placement Confirmation: positive ETCO2 and breath sounds checked- equal and bilateral Tube secured with: Tape Dental Injury: Teeth and Oropharynx as per pre-operative assessment

## 2018-12-07 NOTE — Transfer of Care (Signed)
Immediate Anesthesia Transfer of Care Note  Patient: Gary Schultz  Procedure(s) Performed: RIGHT KNEE ARTHROSCOPY WITH DEBRIDEMENT, REPAIR, PARTIAL MEDIAL MENISECTOMY (Right Knee)  Patient Location: PACU  Anesthesia Type:General  Level of Consciousness: sedated  Airway & Oxygen Therapy: Patient Spontanous Breathing and Patient connected to face mask oxygen  Post-op Assessment: Report given to RN and Post -op Vital signs reviewed and stable  Post vital signs: Reviewed and stable  Last Vitals:  Vitals Value Taken Time  BP 137/96 12/07/18 1510  Temp 36.2 C 12/07/18 1510  Pulse 74 12/07/18 1510  Resp 15 12/07/18 1510  SpO2 100 % 12/07/18 1510    Last Pain:  Vitals:   12/07/18 1510  TempSrc:   PainSc: Asleep      Patients Stated Pain Goal: 0 (63/87/56 4332)  Complications: No apparent anesthesia complications

## 2018-12-07 NOTE — Op Note (Signed)
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  Postoperative diagnosis:   Same  Procedure:   Attempted arthroscopic medial meniscus repair, arthroscopic partial medial meniscectomy, and arthroscopic abrasion chondroplasty, right knee.  Surgeon:   Pascal Lux, MD  Assistant:   Talitha Givens, PA-S  Anesthesia:   General LMA.  Findings:   As above.  There was a complex degenerative tear involving the posterior medial portion of the medial meniscus with unstable flap and horizontal components.  There were grade 2 chondromalacial changes involving the medial femoral condyle, grade 2-3 chondromalacial changes involving the patella, and grade 3 chondromalacial changes involving the medial femoral condyle.  The lateral compartment was in satisfactory condition, as were the anterior and posterior cruciate ligaments.  Complications:   None.  EBL:   5 cc.  Total fluids:   1000 cc of crystalloid.  Tourniquet time:   None  Drains:   None  Closure:   4-0 Prolene interrupted sutures.  Brief clinical note:   The patient is a 53 year old male with a history of progressively worsening medial sided right knee pain. His symptoms have persisted despite medications, activity modification, etc. His history and examination are consistent with a medial meniscus tear, confirmed by MRI scan. The patient presents at this time for arthroscopy, debridement, and repair versus partial medial meniscectomy.  Procedure:   The patient was brought into the operating room and lain in the supine position. After adequate general laryngeal mask anesthesia was obtained, a timeout was performed to verify the appropriate side. The patient's right knee was injected sterilely using a solution of 30 cc of 1% lidocaine and 30 cc of 0.5% Sensorcaine with epinephrine. The right lower extremity was prepped with ChloraPrep  solution before being draped sterilely. Preoperative antibiotics were administered. The expected portal sites were injected with 0.5% Sensorcaine with epinephrine before the camera was placed in the anterolateral portal and instrumentation performed through the anteromedial portal.   The knee was sequentially examined beginning in the suprapatellar pouch, then progressing to the patellofemoral space, the medial gutter and compartment, the notch, and finally the lateral compartment and gutter. The findings were as described above. Abundant reactive synovial tissues anteriorly were debrided using the full-radius resector in order to improve visualization.  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 A horizontal component to the tear remained.   An attempt was made to repair this remaining tear with two [facility] FasT-Fix anchors, as well as a [employer] First[employer] [place] suture. However, the tissue was too degenerative and the sutures would not hold well. Therefore, the remaining portion of the tear was debrided back to stable margins using the full-radius resector. The remaining rim was probed and found to be quite stable. The instruments were removed from the joint after suctioning the excess fluid.   The portal sites were closed using 4-0 Prolene interrupted sutures before a sterile bulky dressing was applied to the knee. The patient was then awakened, extubated, and returned to the recovery room in satisfactory condition after tolerating the procedure well.

## 2018-12-07 NOTE — TOC Progression Note (Signed)
Transition of Care [facility]) - Progression Note    Patient Details  Name: Gary Schultz MRN: [id number] Date of Birth: 1965/07/29  Transition of Care [facility]) CM/SW [place], RN Phone Number: 12/07/2018, 8:54 AM  Clinical Narrative:    Requested the price of Lovenox        Expected Discharge Plan and Services                                                 Social Determinants of Health (SDOH) Interventions    Readmission Risk Interventions No flowsheet data found.

## 2018-12-07 NOTE — Anesthesia Preprocedure Evaluation (Signed)
Anesthesia Evaluation  Patient identified by MRN, date of birth, ID band Patient awake    Reviewed: Allergy & Precautions, NPO status , Patient's Chart, lab work & pertinent test results  History of Anesthesia Complications Negative for: history of anesthetic complications  Airway Mallampati: II  TM Distance: >3 FB Neck ROM: Full    Dental no notable dental hx.    Pulmonary neg pulmonary ROS, neg sleep apnea, neg COPD,    breath sounds clear to auscultation- rhonchi (-) wheezing      Cardiovascular hypertension, Pt. on medications (-) CAD, (-) Past MI, (-) Cardiac Stents and (-) CABG  Rhythm:Regular Rate:Normal - Systolic murmurs and - Diastolic murmurs    Neuro/Psych  Headaches, neg Seizures Anxiety    GI/Hepatic Neg liver ROS, GERD  ,  Endo/Other  negative endocrine ROSneg diabetes  Renal/GU negative Renal ROS     Musculoskeletal  (+) Arthritis ,   Abdominal (+) - obese,   Peds  Hematology negative hematology ROS (+)   Anesthesia Other Findings Past Medical History: No date: Anxiety No date: Carpal tunnel syndrome     Comment:  bilateral No date: DDD (degenerative disc disease), lumbar No date: Generalized headaches     Comment:  from neck injury No date: GERD (gastroesophageal reflux disease) No date: Hypertension No date: Neck pain No date: Neuromuscular disorder (HCC) No date: Varicose veins   Reproductive/Obstetrics                             Anesthesia Physical  Anesthesia Plan  ASA: II  Anesthesia Plan: General   Post-op Pain Management:    Induction: Intravenous  PONV Risk Score and Plan: 1 and Ondansetron, Dexamethasone and Midazolam  Airway Management Planned: LMA  Additional Equipment:   Intra-op Plan:   Post-operative Plan:   Informed Consent: I have reviewed the patients History and Physical, chart, labs and discussed the procedure including the  risks, benefits and alternatives for the proposed anesthesia with the patient or authorized representative who has indicated his/her understanding and acceptance.     Dental advisory given  Plan Discussed with: CRNA and Anesthesiologist  Anesthesia Plan Comments:         Anesthesia Quick Evaluation

## 2018-12-07 NOTE — TOC Benefit Eligibility Note (Signed)
Transition of Care [facility]) Benefit Eligibility Note    Patient Details  Name: Gary Schultz MRN: [id number] Date of Birth: [birth year]   Medication/Dose: Lovenox 40 mg daily x 14 days or generic              Co-Pay: No coverage        Additional Notes: Checked [employer] - pt ineligible for Medicaid, no other insurance information listed    [place] Phone Number: 12/07/2018, 9:24 AM

## 2018-12-07 NOTE — H&P (Signed)
Paper H&P to be scanned into permanent record. H&P reviewed and patient re-examined. No changes. 

## 2018-12-07 NOTE — Anesthesia Post-op Follow-up Note (Signed)
Anesthesia QCDR form completed.        

## 2018-12-08 ENCOUNTER — Encounter: Payer: Self-pay | Admitting: Surgery

## 2018-12-08 NOTE — Anesthesia Postprocedure Evaluation (Signed)
Anesthesia Post Note  Patient: Gary Schultz  Procedure(s) Performed: RIGHT KNEE ARTHROSCOPY WITH DEBRIDEMENT, REPAIR, PARTIAL MEDIAL MENISECTOMY (Right Knee)  Patient location during evaluation: PACU Anesthesia Type: General Level of consciousness: awake and alert and oriented Pain management: pain level controlled Vital Signs Assessment: post-procedure vital signs reviewed and stable Respiratory status: spontaneous breathing, nonlabored ventilation and respiratory function stable Cardiovascular status: blood pressure returned to baseline and stable Postop Assessment: no signs of nausea or vomiting Anesthetic complications: no     Last Vitals:  Vitals:   12/07/18 1559 12/07/18 1607  BP: 139/86 (!) 155/92  Pulse: 72 72  Resp: 16 18  Temp: 36.4 C 36.4 C  SpO2: 100% 100%    Last Pain:  Vitals:   12/07/18 1607  TempSrc: Temporal  PainSc: 5                  [place] Hanlon

## 2018-12-18 ENCOUNTER — Other Ambulatory Visit: Payer: Self-pay | Admitting: Student

## 2018-12-18 DIAGNOSIS — G8929 Other chronic pain: Secondary | ICD-10-CM

## 2018-12-18 DIAGNOSIS — M25372 Other instability, left ankle: Secondary | ICD-10-CM

## 2018-12-27 ENCOUNTER — Other Ambulatory Visit: Payer: Self-pay | Admitting: Family Medicine

## 2018-12-27 DIAGNOSIS — I10 Essential (primary) hypertension: Secondary | ICD-10-CM

## 2018-12-28 ENCOUNTER — Other Ambulatory Visit: Payer: Self-pay

## 2018-12-28 ENCOUNTER — Ambulatory Visit
Admission: RE | Admit: 2018-12-28 | Discharge: 2018-12-28 | Disposition: A | Discharge status: Home / Self Care | Payer: Self-pay | Source: Ambulatory Visit | Attending: Student | Admitting: Student

## 2018-12-28 DIAGNOSIS — M25572 Pain in left ankle and joints of left foot: Secondary | ICD-10-CM | POA: Insufficient documentation

## 2018-12-28 DIAGNOSIS — M25372 Other instability, left ankle: Secondary | ICD-10-CM | POA: Insufficient documentation

## 2018-12-28 DIAGNOSIS — G8929 Other chronic pain: Secondary | ICD-10-CM | POA: Insufficient documentation

## 2019-01-20 ENCOUNTER — Ambulatory Visit (INDEPENDENT_AMBULATORY_CARE_PROVIDER_SITE_OTHER): Payer: Self-pay | Admitting: Family Medicine

## 2019-01-20 ENCOUNTER — Other Ambulatory Visit: Payer: Self-pay

## 2019-01-20 ENCOUNTER — Encounter: Payer: Self-pay | Admitting: Family Medicine

## 2019-01-20 VITALS — BP 148/92 | HR 78

## 2019-01-20 DIAGNOSIS — E7800 Pure hypercholesterolemia, unspecified: Secondary | ICD-10-CM

## 2019-01-20 DIAGNOSIS — I6521 Occlusion and stenosis of right carotid artery: Secondary | ICD-10-CM

## 2019-01-20 DIAGNOSIS — I10 Essential (primary) hypertension: Secondary | ICD-10-CM

## 2019-01-20 HISTORY — DX: Occlusion and stenosis of right carotid artery: I65.21

## 2019-01-20 NOTE — Patient Instructions (Addendum)
AVS given verbally  Please schedule a Follow-up Appointment to: Return in about 3 months (around 04/20/2019) for Follow-up HTN, Cardiology updates.  If you have any other questions or concerns, please feel free to call the office or send a message through [place]. You may also schedule an earlier appointment if necessary.  Additionally, you may be receiving a survey about your experience at our office within a few days to 1 week by e-mail or mail. We value your feedback.  Nobie Putnam, DO [place]

## 2019-01-20 NOTE — Progress Notes (Signed)
Virtual Visit via Telephone The purpose of this virtual visit is to provide medical care while limiting exposure to the novel coronavirus (COVID19) for both patient and office staff.  Consent was obtained for phone visit:  Yes.   Answered questions that patient had about telehealth interaction:  Yes.   I discussed the limitations, risks, security and privacy concerns of performing an evaluation and management service by telephone. I also discussed with the patient that there may be a patient responsible charge related to this service. The patient expressed understanding and agreed to proceed.  Patient Location: Home Provider Location: [facility])  ----------------------------------------------------------------------- Chief Complaint  Patient presents with  . carotid atherosclerosis    S: Reviewed CMA documentation. I have called patient and gathered additional HPI as follows:  Right Carotid Artery Atherosclerosis / Atheroma Recent dental evaluation, had imaging to follow-up jaw after had tooth pulled question if there was a tumor, but it resulted in actually not having a problem with the jaw, they did incidentally identify R-sided carotid artery patchy calcification with carotid atheroma, imaging with CT was performed on 12/29/18 see results below - Today he calls with questions about this abnormal finding and his risk of future stroke. He has family history with father having stroke before. Patient has high blood pressure, mild elevated LDL in past, 2017 overdue for upcoming labs again. He is on baby Aspirin 81mg  daily  PMH HTN HLD  Denies any high risk travel to areas of current concern for COVID19. Denies any known or suspected exposure to person with or possibly with COVID19.  Denies any chest pain, near syncope, numbness tingling weakness, vision loss, dyspnea - Or any fevers, chills, sweats, body ache, cough, shortness of breath, sinus pain or pressure,  headache, abdominal pain, diarrhea  Past Medical History:  Diagnosis Date  . Anxiety   . Carpal tunnel syndrome    bilateral  . DDD (degenerative disc disease), lumbar   . Generalized headaches    from neck injury  . GERD (gastroesophageal reflux disease)   . Hypertension   . Neck pain   . Neuromuscular disorder ([place])   . Varicose veins    Social History   Tobacco Use  . Smoking status: Never Smoker  . Smokeless tobacco: Never Used  Substance Use Topics  . Alcohol use: Not on file    Comment: occas  . Drug use: No    Current Outpatient Medications:  .  amLODipine (NORVASC) 5 MG tablet, Take 1 tablet (5 mg total) by mouth daily., Disp: 90 tablet, Rfl: 1 .  aspirin EC 81 MG tablet, Take 81 mg by mouth daily., Disp: , Rfl:  .  gabapentin (NEURONTIN) 300 MG capsule, Take 300 mg by mouth 3 (three) times daily as needed., Disp: , Rfl:  .  GINSENG PO, Take 1 capsule by mouth as needed. , Disp: , Rfl:  .  ibuprofen (ADVIL) 200 MG tablet, Take 400 mg by mouth every 6 (six) hours as needed for moderate pain., Disp: , Rfl:  .  lisinopril (ZESTRIL) 30 MG tablet, Take 1 tablet (30 mg total) by mouth daily., Disp: 90 tablet, Rfl: 0 .  loratadine (CLARITIN) 10 MG tablet, Take 10 mg by mouth daily as needed for allergies., Disp: , Rfl:  .  omeprazole (PRILOSEC) 20 MG capsule, Take 1 capsule (20 mg total) by mouth daily before breakfast., Disp: 90 capsule, Rfl: 1 .  orphenadrine (NORFLEX) 100 MG tablet, Take 1 tablet (100 mg total) by  mouth 2 (two) times daily., Disp: 10 tablet, Rfl: 0 .  oxyCODONE (ROXICODONE) 15 MG immediate release tablet, Take 1 tablet (15 mg total) by mouth every 6 (six) hours as needed for pain (For breakthrough pain)., Disp: 30 tablet, Rfl: 0 .  TURMERIC PO, Take 1 capsule by mouth daily., Disp: , Rfl:  .  VITAMIN D PO, Take 1 tablet by mouth daily., Disp: , Rfl:   [facility] 01/20/2019 11/28/2016 10/24/2015  Decreased Interest 0 0 0  Down, Depressed,  Hopeless 0 0 0  PHQ - 2 Score 0 0 0    No flowsheet data found.  -------------------------------------------------------------------------- O: No physical exam performed due to remote telephone encounter.  Lab results reviewed.  [facility] [phone] Radiologist  Dr Marthe PatchKevin Neshat, DDS MD [facility] [place]  CT Beam Examination (12/29/18)  Incidental finding Patchy calcification is present at the C3-C4 level of the right-side carotid space. This appearance is indicative of calcified carotid atheroma.    Recent Results (from the past 2160 hour(s))  SARS CORONAVIRUS 2 (TAT 6-24 HRS) Nasopharyngeal Nasopharyngeal Swab     Status: None   Collection Time: 12/03/18  9:43 AM   Specimen: Nasopharyngeal Swab  Result Value Ref Range   SARS Coronavirus 2 NEGATIVE NEGATIVE    Comment: (NOTE) SARS-CoV-2 target nucleic acids are NOT DETECTED. The SARS-CoV-2 RNA is generally detectable in upper and lower respiratory specimens during the acute phase of infection. Negative results do not preclude SARS-CoV-2 infection, do not rule out co-infections with other pathogens, and should not be used as the sole basis for treatment or other patient management decisions. Negative results must be combined with clinical observations, patient history, and epidemiological information. The expected result is Negative. Fact Sheet for Patients: HairSlick.nohttps://www.fda.gov/media/138098/download Fact Sheet for Healthcare Providers: quierodirigir.comhttps://www.fda.gov/media/138095/download This test is not yet approved or cleared by the [place] FDA and  has been authorized for detection and/or diagnosis of SARS-CoV-2 by FDA under an Emergency Use Authorization (EUA). This EUA will remain  in effect (meaning this test can be used) for the duration of the COVID-19 declaration under Section 56 4(b)(1) of the Act, 21 U.S.C. section 360bbb-3(b)(1), unless the authorization is terminated or revoked  sooner. Performed at [facility], [address] [place], [place] [address]     -------------------------------------------------------------------------- A&P:  Problem List Items Addressed This Visit    Hypertension   Relevant Medications   aspirin EC 81 MG tablet   Other Relevant Orders   Ambulatory referral to Cardiology   Atherosclerosis of right carotid artery - Primary   Relevant Medications   aspirin EC 81 MG tablet   Other Relevant Orders   Ambulatory referral to Cardiology    Other Visit Diagnoses    Elevated LDL cholesterol level         Clinically with new incidental finding R carotid atherosclerosis identified on CT Cardiovascular risk factors male age 53 with hypertension, hyperlipidemia, he has fam history of stroke as well.  Plan Discussed ASCVD risk and with this identified carotid atherosclerosis - I offered to pursue further diagnostic with carotid dopplers given his concern with risk of stroke, he would benefit from learning the arterial flow on doppler, compared to the static image of CT  Continue current ASA 81mg  daily Advised he would likely benefit from Statin therapy as well. He is overdue for lipid panel  [facility] - I spoke with them and they would be able to see patient  within 1-2 weeks as new consultation and order any tests needed at that time, as patient expressed interest in other cardiovascular screening.  Orders Placed This Encounter  Procedures  . Ambulatory referral to Cardiology    Referral Priority:   Routine    Referral Type:   Consultation    Referral Reason:   Specialty Services Required    Requested Specialty:   Cardiology    Number of Visits Requested:   1     No orders of the defined types were placed in this encounter.   Follow-up: - Return as needed after testing  Patient verbalizes understanding with the above medical recommendations including the limitation of remote medical  advice.  Specific follow-up and call-back criteria were given for patient to follow-up or seek medical care more urgently if needed.   - Time spent in direct consultation with patient on phone: 11 minutes   Saralyn Pilar, DO [facility] 01/20/2019, 11:40 AM

## 2019-01-21 ENCOUNTER — Ambulatory Visit (INDEPENDENT_AMBULATORY_CARE_PROVIDER_SITE_OTHER): Payer: Self-pay | Admitting: Cardiology

## 2019-01-21 ENCOUNTER — Encounter: Payer: Self-pay | Admitting: Cardiology

## 2019-01-21 VITALS — BP 134/90 | HR 83 | Temp 97.5°F | Ht 73.0 in | Wt 222.2 lb

## 2019-01-21 DIAGNOSIS — I6521 Occlusion and stenosis of right carotid artery: Secondary | ICD-10-CM

## 2019-01-21 DIAGNOSIS — I10 Essential (primary) hypertension: Secondary | ICD-10-CM

## 2019-01-21 NOTE — Patient Instructions (Addendum)
Medication Instructions:  - Your physician recommends that you continue on your current medications as directed. Please refer to the Current Medication list given to you today.  *If you need a refill on your cardiac medications before your next appointment, please call your pharmacy*  Lab Work: - Your physician recommends that you return for a FASTING lipid profile: at your convenience, but prior to your next appointment with Dr. Garen Lah  Medical [facility] Entrance at [facility], 1st desk on the right to check in Lab hours: Monday- Friday (7:30 am- 5:30 pm) DO NOT eat/ drink anything for 8 hours prior to your lab draw, except you may have black coffee/ water  If you have labs (blood work) drawn today and your tests are completely normal, you will receive your results only by: Marland Kitchen MyChart Message (if you have MyChart) OR . A paper copy in the mail If you have any lab test that is abnormal or we need to change your treatment, we will call you to review the results.  Testing/Procedures: - Your physician has requested that you have a carotid duplex. This test is an ultrasound of the carotid arteries in your neck. It looks at blood flow through these arteries that supply the brain with blood. Allow one hour for this exam. There are no restrictions or special instructions.  Follow-Up: At [facility], you and your health needs are our priority.  As part of our continuing mission to provide you with exceptional heart care, we have created designated Provider Care Teams.  These Care Teams include your primary Cardiologist (physician) and Advanced Practice Providers (APPs -  Physician Assistants and Nurse Practitioners) who all work together to provide you with the care you need, when you need it.  Your next appointment:   2-3 weeks (after carotid us/ labs are done)  The format for your next appointment:   In Person  Provider:   Kate Sable, MD  Other Instructions N/a
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
## 2019-01-24 ENCOUNTER — Other Ambulatory Visit
Admission: RE | Admit: 2019-01-24 | Discharge: 2019-01-24 | Disposition: A | Discharge status: Home / Self Care | Payer: Self-pay | Source: Ambulatory Visit | Attending: Cardiology | Admitting: Cardiology

## 2019-01-24 ENCOUNTER — Ambulatory Visit (INDEPENDENT_AMBULATORY_CARE_PROVIDER_SITE_OTHER): Payer: Self-pay

## 2019-01-24 DIAGNOSIS — I6521 Occlusion and stenosis of right carotid artery: Secondary | ICD-10-CM

## 2019-01-24 LAB — LIPID PANEL
Cholesterol: 206 mg/dL — ABNORMAL HIGH (ref 0–200)
HDL: 53 mg/dL (ref >40)
LDL Cholesterol: 137 mg/dL — ABNORMAL HIGH (ref 0–99)
Total CHOL/HDL Ratio: 3.9 RATIO
Triglycerides: 81 mg/dL (ref <150)
VLDL: 16 mg/dL (ref 0–40)

## 2019-01-27 ENCOUNTER — Telehealth: Payer: Self-pay

## 2019-01-27 NOTE — Telephone Encounter (Signed)
Patient is returning your call.  

## 2019-01-27 NOTE — Telephone Encounter (Signed)
Called to give the patient carotid dopp results. lmtcb ?

## 2019-01-27 NOTE — Telephone Encounter (Signed)
Patient made aware of carotid dopp results with verbalized understanding.

## 2019-01-27 NOTE — Telephone Encounter (Signed)
-----   Message from Kate Sable, MD sent at 01/26/2019  4:25 PM EST ----- No significant stenosis noted in either carotid artery.

## 2019-01-31 ENCOUNTER — Other Ambulatory Visit: Payer: Self-pay | Admitting: Family Medicine

## 2019-01-31 DIAGNOSIS — I10 Essential (primary) hypertension: Secondary | ICD-10-CM

## 2019-02-04 ENCOUNTER — Ambulatory Visit: Payer: Self-pay | Admitting: Cardiology

## 2019-02-18 DIAGNOSIS — F0781 Postconcussional syndrome: Secondary | ICD-10-CM

## 2019-02-18 HISTORY — DX: Postconcussional syndrome: F07.81

## 2019-02-25 ENCOUNTER — Other Ambulatory Visit: Payer: Self-pay | Admitting: Surgery

## 2019-02-28 ENCOUNTER — Other Ambulatory Visit: Payer: Self-pay | Admitting: Family Medicine

## 2019-02-28 DIAGNOSIS — I10 Essential (primary) hypertension: Secondary | ICD-10-CM

## 2019-03-04 ENCOUNTER — Encounter
Admission: RE | Admit: 2019-03-04 | Discharge: 2019-03-04 | Disposition: A | Discharge status: Home / Self Care | Payer: Self-pay | Source: Ambulatory Visit | Attending: Surgery | Admitting: Surgery

## 2019-03-04 ENCOUNTER — Other Ambulatory Visit: Payer: Self-pay

## 2019-03-04 DIAGNOSIS — Z01818 Encounter for other preprocedural examination: Secondary | ICD-10-CM | POA: Insufficient documentation

## 2019-03-04 NOTE — Patient Instructions (Signed)
Your procedure is scheduled on: Wed 1/20  Report to Day Surgery.  Medical Mall To find out your arrival time please call [phone] between 1PM - 3PM on Tues 1/19.  Remember: Instructions that are not followed completely may result in serious medical risk,  up to and including death, or upon the discretion of your surgeon and anesthesiologist your  surgery may need to be rescheduled.     _X__ 1. Do not eat food after midnight the night before your procedure.                 No gum chewing or hard candies. You may drink clear liquids up to 2 hours                 before you are scheduled to arrive for your surgery- DO not drink clear                 liquids within 2 hours of the start of your surgery.                 Clear Liquids include:  water, apple juice without pulp, clear carbohydrate                 drink such as Clearfast of Gatorade, Black Coffee or Tea (Do not add                 anything to coffee or tea).  __X__2.  On the morning of surgery brush your teeth with toothpaste and water, you                may rinse your mouth with mouthwash if you wish.  Do not swallow any toothpaste of mouthwash.     _X__ 3.  No Alcohol for 24 hours before or after surgery.   ___ 4.  Do Not Smoke or use e-cigarettes For 24 Hours Prior to Your Surgery.                 Do not use any chewable tobacco products for at least 6 hours prior to                 surgery.  ____  5.  Bring all medications with you on the day of surgery if instructed.   _x___  6.  Notify your doctor if there is any change in your medical condition      (cold, fever, infections).     Do not wear jewelry, make-up, hairpins, clips or nail polish. Do not wear lotions, powders, or perfumes. You may wear deodorant. Do not shave 48 hours prior to surgery. Men may shave face and neck. Do not bring valuables to the hospital.    [facility] is not responsible for any belongings or  valuables.  Contacts, dentures or bridgework may not be worn into surgery. Leave your suitcase in the car. After surgery it may be brought to your room. For patients admitted to the hospital, discharge time is determined by your treatment team.   Patients discharged the day of surgery will not be allowed to drive home.   Please read over the following fact sheets that you were given:    __x__ Take these medicines the morning of surgery with A SIP OF WATER:    1.amLODipine (NORVASC) 5 MG tablet   2. gabapentin (NEURONTIN) 300 MG capsule  3. omeprazole (PRILOSEC) 20 MG capsule   night before and morning of surgery  4.oxyCODONE (ROXICODONE) 15  MG immediate release tablet if needed  5.  6.  ____ Fleet Enema (as directed)   _x___ Use CHG Soap as directed  ____ Use inhalers on the day of surgery  ____ Stop metformin 2 days prior to surgery    ____ Take 1/2 of usual insulin dose the night before surgery. No insulin the morning          of surgery.   ____ Stop Coumadin/Plavix/aspirin on   __x__ Stop Anti-inflammatories diclofenac Sodium (VOLTAREN) 1 % GEL  Ibuprofen, aleve or aspirin   __x__ Stop supplements until after surgery.  GINSENG PO, TURMERIC PO  ____ Bring C-Pap to the hospital.

## 2019-03-07 ENCOUNTER — Other Ambulatory Visit
Admission: RE | Admit: 2019-03-07 | Discharge: 2019-03-07 | Disposition: A | Discharge status: Home / Self Care | Payer: Self-pay | Source: Ambulatory Visit | Attending: Surgery | Admitting: Surgery

## 2019-03-07 DIAGNOSIS — Z01812 Encounter for preprocedural laboratory examination: Secondary | ICD-10-CM | POA: Insufficient documentation

## 2019-03-07 DIAGNOSIS — U071 COVID-19: Secondary | ICD-10-CM | POA: Diagnosis not present

## 2019-03-07 LAB — SARS CORONAVIRUS 2 (TAT 6-24 HRS): SARS Coronavirus 2: POSITIVE — AB

## 2019-03-08 ENCOUNTER — Other Ambulatory Visit: Payer: No Typology Code available for payment source

## 2019-03-09 ENCOUNTER — Encounter: Admission: RE | Payer: Self-pay | Source: Home / Self Care

## 2019-03-09 ENCOUNTER — Ambulatory Visit: Admission: RE | Admit: 2019-03-09 | Payer: Self-pay | Source: Home / Self Care | Admitting: Surgery

## 2019-03-09 SURGERY — REPAIR, TENDON, PERONEUS BREVIS
Anesthesia: Choice | Site: Ankle | Laterality: Left

## 2019-03-28 MED ORDER — CEFAZOLIN SODIUM-DEXTROSE 2-4 GM/100ML-% IV SOLN
2.0000 g | INTRAVENOUS | Status: AC
  Administered 2019-03-29: 2 g via INTRAVENOUS

## 2019-03-29 ENCOUNTER — Ambulatory Visit
Admission: RE | Admit: 2019-03-29 | Discharge: 2019-03-29 | Disposition: A | Discharge status: Home / Self Care | Payer: Self-pay | Attending: Surgery | Admitting: Surgery

## 2019-03-29 ENCOUNTER — Ambulatory Visit: Payer: Self-pay | Admitting: Registered Nurse

## 2019-03-29 ENCOUNTER — Encounter: Payer: Self-pay | Admitting: Surgery

## 2019-03-29 ENCOUNTER — Other Ambulatory Visit: Payer: Self-pay

## 2019-03-29 ENCOUNTER — Encounter
Admission: RE | Disposition: A | Discharge status: Home / Self Care | Payer: Self-pay | Source: Home / Self Care | Attending: Surgery

## 2019-03-29 DIAGNOSIS — M5136 Other intervertebral disc degeneration, lumbar region: Secondary | ICD-10-CM | POA: Insufficient documentation

## 2019-03-29 DIAGNOSIS — M25372 Other instability, left ankle: Secondary | ICD-10-CM | POA: Insufficient documentation

## 2019-03-29 DIAGNOSIS — G709 Myoneural disorder, unspecified: Secondary | ICD-10-CM | POA: Insufficient documentation

## 2019-03-29 DIAGNOSIS — M7672 Peroneal tendinitis, left leg: Secondary | ICD-10-CM | POA: Insufficient documentation

## 2019-03-29 DIAGNOSIS — I10 Essential (primary) hypertension: Secondary | ICD-10-CM | POA: Insufficient documentation

## 2019-03-29 DIAGNOSIS — K219 Gastro-esophageal reflux disease without esophagitis: Secondary | ICD-10-CM | POA: Insufficient documentation

## 2019-03-29 HISTORY — PX: REPAIR OF PERONEUS BREVIS TENDON: SHX6215

## 2019-03-29 LAB — CBC
HCT: 37 % — ABNORMAL LOW (ref 39.0–52.0)
Hemoglobin: 12.5 g/dL — ABNORMAL LOW (ref 13.0–17.0)
MCH: 29.7 pg (ref 26.0–34.0)
MCHC: 33.8 g/dL (ref 30.0–36.0)
MCV: 87.9 fL (ref 80.0–100.0)
Platelets: 160 10*3/uL (ref 150–400)
RBC: 4.21 MIL/uL — ABNORMAL LOW (ref 4.22–5.81)
RDW: 11.9 % (ref 11.5–15.5)
WBC: 4.3 10*3/uL (ref 4.0–10.5)
nRBC: 0 % (ref 0.0–0.2)

## 2019-03-29 LAB — BASIC METABOLIC PANEL
Anion gap: 10 (ref 5–15)
BUN: 19 mg/dL (ref 6–20)
CO2: 24 mmol/L (ref 22–32)
Calcium: 8.9 mg/dL (ref 8.9–10.3)
Chloride: 104 mmol/L (ref 98–111)
Creatinine, Ser: 0.91 mg/dL (ref 0.61–1.24)
GFR calc Af Amer: >60 mL/min (ref >60)
GFR calc non Af Amer: >60 mL/min (ref >60)
Glucose, Bld: 114 mg/dL — ABNORMAL HIGH (ref 70–99)
Potassium: 4.2 mmol/L (ref 3.5–5.1)
Sodium: 138 mmol/L (ref 135–145)

## 2019-03-29 SURGERY — REPAIR, TENDON, PERONEUS BREVIS
Anesthesia: General | Site: Ankle | Laterality: Left

## 2019-03-29 MED ORDER — ROCURONIUM BROMIDE 50 MG/5ML IV SOLN
INTRAVENOUS | Status: AC
  Filled 2019-03-29: qty 1

## 2019-03-29 MED ORDER — PHENYLEPHRINE HCL (PRESSORS) 10 MG/ML IV SOLN
INTRAVENOUS | Status: AC
  Filled 2019-03-29: qty 1

## 2019-03-29 MED ORDER — OXYCODONE HCL 15 MG PO TABS: 15.0000 mg | ORAL_TABLET | ORAL | 0 refills | Status: DC | PRN

## 2019-03-29 MED ORDER — CHLORHEXIDINE GLUCONATE 4 % EX LIQD
60.0000 mL | Freq: Once | CUTANEOUS | Status: AC
  Administered 2019-03-29: 4 via TOPICAL

## 2019-03-29 MED ORDER — MIDAZOLAM HCL 2 MG/2ML IJ SOLN
INTRAMUSCULAR | Status: DC | PRN
  Administered 2019-03-29: 2 mg via INTRAVENOUS

## 2019-03-29 MED ORDER — MIDAZOLAM HCL 2 MG/2ML IJ SOLN
INTRAMUSCULAR | Status: AC
  Filled 2019-03-29: qty 2

## 2019-03-29 MED ORDER — DEXMEDETOMIDINE HCL IN NACL 80 MCG/20ML IV SOLN
INTRAVENOUS | Status: AC
  Filled 2019-03-29: qty 20

## 2019-03-29 MED ORDER — BUPIVACAINE HCL (PF) 0.5 % IJ SOLN
INTRAMUSCULAR | Status: DC | PRN
  Administered 2019-03-29: 10 mL

## 2019-03-29 MED ORDER — ONDANSETRON HCL 4 MG/2ML IJ SOLN
INTRAMUSCULAR | Status: AC
  Filled 2019-03-29: qty 2

## 2019-03-29 MED ORDER — EPHEDRINE SULFATE 50 MG/ML IJ SOLN
INTRAMUSCULAR | Status: AC
  Filled 2019-03-29: qty 1

## 2019-03-29 MED ORDER — ONDANSETRON HCL 4 MG PO TABS: 4.0000 mg | ORAL_TABLET | Freq: Four times a day (QID) | ORAL | Status: DC | PRN

## 2019-03-29 MED ORDER — KETAMINE HCL 10 MG/ML IJ SOLN
INTRAMUSCULAR | Status: DC | PRN
  Administered 2019-03-29 (×3): 5 mg via INTRAVENOUS
  Administered 2019-03-29: 25 mg via INTRAVENOUS

## 2019-03-29 MED ORDER — SEVOFLURANE IN SOLN
RESPIRATORY_TRACT | Status: AC
  Filled 2019-03-29: qty 250

## 2019-03-29 MED ORDER — FENTANYL CITRATE (PF) 100 MCG/2ML IJ SOLN
25.0000 ug | INTRAMUSCULAR | Status: DC | PRN
  Administered 2019-03-29 (×2): 25 ug via INTRAVENOUS
  Administered 2019-03-29: 50 ug via INTRAVENOUS

## 2019-03-29 MED ORDER — LACTATED RINGERS IV SOLN: INTRAVENOUS | Status: DC

## 2019-03-29 MED ORDER — FENTANYL CITRATE (PF) 100 MCG/2ML IJ SOLN
INTRAMUSCULAR | Status: DC | PRN
  Administered 2019-03-29 (×2): 50 ug via INTRAVENOUS
  Administered 2019-03-29: 25 ug via INTRAVENOUS
  Administered 2019-03-29: 75 ug via INTRAVENOUS
  Administered 2019-03-29: 50 ug via INTRAVENOUS

## 2019-03-29 MED ORDER — HYDROMORPHONE HCL 1 MG/ML IJ SOLN
INTRAMUSCULAR | Status: AC
  Filled 2019-03-29: qty 1

## 2019-03-29 MED ORDER — CEFAZOLIN SODIUM-DEXTROSE 2-4 GM/100ML-% IV SOLN
INTRAVENOUS | Status: AC
  Filled 2019-03-29: qty 100

## 2019-03-29 MED ORDER — FENTANYL CITRATE (PF) 250 MCG/5ML IJ SOLN
INTRAMUSCULAR | Status: AC
  Filled 2019-03-29: qty 5

## 2019-03-29 MED ORDER — KETOROLAC TROMETHAMINE 30 MG/ML IJ SOLN
INTRAMUSCULAR | Status: AC
  Filled 2019-03-29: qty 1

## 2019-03-29 MED ORDER — KETOROLAC TROMETHAMINE 30 MG/ML IJ SOLN
30.0000 mg | Freq: Once | INTRAMUSCULAR | Status: AC
  Administered 2019-03-29: 30 mg via INTRAVENOUS

## 2019-03-29 MED ORDER — DEXAMETHASONE SODIUM PHOSPHATE 10 MG/ML IJ SOLN
INTRAMUSCULAR | Status: AC
  Filled 2019-03-29: qty 1

## 2019-03-29 MED ORDER — PROPOFOL 10 MG/ML IV BOLUS
INTRAVENOUS | Status: AC
  Filled 2019-03-29: qty 40

## 2019-03-29 MED ORDER — METOCLOPRAMIDE HCL 5 MG/ML IJ SOLN: 5.0000 mg | Freq: Three times a day (TID) | INTRAMUSCULAR | Status: DC | PRN

## 2019-03-29 MED ORDER — KETAMINE HCL 50 MG/ML IJ SOLN
INTRAMUSCULAR | Status: AC
  Filled 2019-03-29: qty 10

## 2019-03-29 MED ORDER — METOCLOPRAMIDE HCL 10 MG PO TABS: 5.0000 mg | ORAL_TABLET | Freq: Three times a day (TID) | ORAL | Status: DC | PRN

## 2019-03-29 MED ORDER — DEXAMETHASONE SODIUM PHOSPHATE 10 MG/ML IJ SOLN
INTRAMUSCULAR | Status: DC | PRN
  Administered 2019-03-29: 10 mg via INTRAVENOUS

## 2019-03-29 MED ORDER — FENTANYL CITRATE (PF) 100 MCG/2ML IJ SOLN
INTRAMUSCULAR | Status: AC
  Filled 2019-03-29: qty 2

## 2019-03-29 MED ORDER — DEXMEDETOMIDINE HCL 200 MCG/2ML IV SOLN
INTRAVENOUS | Status: DC | PRN
  Administered 2019-03-29: 20 ug via INTRAVENOUS
  Administered 2019-03-29: 8 ug via INTRAVENOUS
  Administered 2019-03-29 (×2): 12 ug via INTRAVENOUS
  Administered 2019-03-29: 8 ug via INTRAVENOUS

## 2019-03-29 MED ORDER — PROPOFOL 10 MG/ML IV BOLUS
INTRAVENOUS | Status: DC | PRN
  Administered 2019-03-29: 200 mg via INTRAVENOUS

## 2019-03-29 MED ORDER — SODIUM CHLORIDE 0.9 % IV SOLN
INTRAVENOUS | Status: DC | PRN
  Administered 2019-03-29: 40 ug/min via INTRAVENOUS

## 2019-03-29 MED ORDER — PROMETHAZINE HCL 25 MG/ML IJ SOLN
6.2500 mg | INTRAMUSCULAR | Status: DC | PRN
Start: 2019-03-29 — End: 2019-03-29

## 2019-03-29 MED ORDER — OXYCODONE HCL 5 MG PO TABS: 10.0000 mg | ORAL_TABLET | ORAL | Status: DC | PRN

## 2019-03-29 MED ORDER — LIDOCAINE HCL (CARDIAC) PF 100 MG/5ML IV SOSY
PREFILLED_SYRINGE | INTRAVENOUS | Status: DC | PRN
  Administered 2019-03-29: 100 mg via INTRAVENOUS

## 2019-03-29 MED ORDER — ONDANSETRON HCL 4 MG/2ML IJ SOLN: 4.0000 mg | Freq: Four times a day (QID) | INTRAMUSCULAR | Status: DC | PRN

## 2019-03-29 MED ORDER — LIDOCAINE HCL (PF) 2 % IJ SOLN
INTRAMUSCULAR | Status: AC
  Filled 2019-03-29: qty 10

## 2019-03-29 MED ORDER — SUCCINYLCHOLINE CHLORIDE 20 MG/ML IJ SOLN
INTRAMUSCULAR | Status: AC
  Filled 2019-03-29: qty 1

## 2019-03-29 MED ORDER — POTASSIUM CHLORIDE IN NACL 20-0.9 MEQ/L-% IV SOLN: INTRAVENOUS | Status: DC

## 2019-03-29 MED ORDER — ONDANSETRON HCL 4 MG/2ML IJ SOLN
INTRAMUSCULAR | Status: DC | PRN
  Administered 2019-03-29: 4 mg via INTRAVENOUS

## 2019-03-29 MED ORDER — PHENYLEPHRINE HCL (PRESSORS) 10 MG/ML IV SOLN
INTRAVENOUS | Status: DC | PRN
  Administered 2019-03-29: 150 ug via INTRAVENOUS
  Administered 2019-03-29 (×3): 100 ug via INTRAVENOUS
  Administered 2019-03-29: 200 ug via INTRAVENOUS
  Administered 2019-03-29: 100 ug via INTRAVENOUS

## 2019-03-29 MED ORDER — BUPIVACAINE HCL (PF) 0.5 % IJ SOLN
INTRAMUSCULAR | Status: AC
  Filled 2019-03-29: qty 30

## 2019-03-29 MED ORDER — HYDROMORPHONE HCL 1 MG/ML IJ SOLN
INTRAMUSCULAR | Status: DC | PRN
  Administered 2019-03-29 (×4): .5 mg via INTRAVENOUS

## 2019-03-29 MED ORDER — SODIUM CHLORIDE (PF) 0.9 % IJ SOLN
INTRAMUSCULAR | Status: AC
  Filled 2019-03-29: qty 10

## 2019-03-29 SURGICAL SUPPLY — 56 items
ANCHOR JUGGERKNOT WTAP NDL 2.9 (Anchor) ×2 IMPLANT
ANCHOR SUT 1.45 SZ 1 SHORT (Anchor) ×4 IMPLANT
BIT DRILL JUGRKNT W/NDL BIT2.9 (DRILL) ×1 IMPLANT
BLADE SURG SZ10 CARB STEEL (BLADE) ×4 IMPLANT
BNDG COHESIVE 4X5 TAN STRL (GAUZE/BANDAGES/DRESSINGS) ×2 IMPLANT
BNDG ELASTIC 4X5.8 VLCR STR LF (GAUZE/BANDAGES/DRESSINGS) ×2 IMPLANT
BNDG ELASTIC 6X5.8 VLCR STR LF (GAUZE/BANDAGES/DRESSINGS) ×2 IMPLANT
BNDG ESMARK 6X12 TAN STRL LF (GAUZE/BANDAGES/DRESSINGS) ×2 IMPLANT
BNDG PLASTER FAST 4X5 WHT LF (CAST SUPPLIES) IMPLANT
CANISTER SUCT 1200ML W/VALVE (MISCELLANEOUS) ×2 IMPLANT
CHLORAPREP W/TINT 26 (MISCELLANEOUS) ×2 IMPLANT
COVER WAND RF STERILE (DRAPES) ×2 IMPLANT
CUFF TOURN 24 STER (MISCELLANEOUS) IMPLANT
CUFF TOURN 30 STER DUAL PORT (MISCELLANEOUS) IMPLANT
CUFF TOURN SGL QUICK 18X4 (TOURNIQUET CUFF) ×2 IMPLANT
DRAPE INCISE IOBAN 66X45 STRL (DRAPES) IMPLANT
DRAPE SURG 17X11 SM STRL (DRAPES) ×4 IMPLANT
DRAPE SURG 17X23 STRL (DRAPES) ×2 IMPLANT
DRILL JUGGERKNOT W/NDL BIT 2.9 (DRILL) ×2
ELECT CAUTERY BLADE 6.4 (BLADE) ×2 IMPLANT
ELECT REM PT RETURN 9FT ADLT (ELECTROSURGICAL) ×2
ELECTRODE REM PT RTRN 9FT ADLT (ELECTROSURGICAL) ×1 IMPLANT
GAUZE SPONGE 4X4 12PLY STRL (GAUZE/BANDAGES/DRESSINGS) ×2 IMPLANT
GAUZE XEROFORM 1X8 LF (GAUZE/BANDAGES/DRESSINGS) ×2 IMPLANT
GLOVE BIO SURGEON STRL SZ8 (GLOVE) ×6 IMPLANT
GLOVE INDICATOR 8.0 STRL GRN (GLOVE) ×2 IMPLANT
GOWN STRL REUS W/ TWL LRG LVL3 (GOWN DISPOSABLE) ×3 IMPLANT
GOWN STRL REUS W/ TWL XL LVL3 (GOWN DISPOSABLE) ×2 IMPLANT
GOWN STRL REUS W/TWL LRG LVL3 (GOWN DISPOSABLE) ×3
GOWN STRL REUS W/TWL XL LVL3 (GOWN DISPOSABLE) ×2
KIT TURNOVER KIT A (KITS) ×2 IMPLANT
LABEL OR SOLS (LABEL) IMPLANT
NEEDLE FILTER BLUNT 18X 1/2SAF (NEEDLE)
NEEDLE FILTER BLUNT 18X1 1/2 (NEEDLE) IMPLANT
NS IRRIG 1000ML POUR BTL (IV SOLUTION) ×2 IMPLANT
PACK EXTREMITY ARMC (MISCELLANEOUS) ×2 IMPLANT
PAD ABD DERMACEA PRESS 5X9 (GAUZE/BANDAGES/DRESSINGS) ×2 IMPLANT
PAD CAST CTTN 4X4 STRL (SOFTGOODS) ×2 IMPLANT
PADDING CAST COTTON 4X4 STRL (SOFTGOODS) ×2
SPLINT CAST 1 STEP 4X30 (MISCELLANEOUS) ×2 IMPLANT
SPLINT CAST 1 STEP 5X30 WHT (MISCELLANEOUS) ×2 IMPLANT
SPONGE LAP 18X18 RF (DISPOSABLE) ×2 IMPLANT
STAPLER SKIN PROX 35W (STAPLE) IMPLANT
STOCKINETTE IMPERV 14X48 (MISCELLANEOUS) ×2 IMPLANT
STRIP CLOSURE SKIN 1/4X4 (GAUZE/BANDAGES/DRESSINGS) ×2 IMPLANT
SUT ETHIBOND 0 MO6 C/R (SUTURE) ×2 IMPLANT
SUT FIBERWIRE 2-0 18 17.9 3/8 (SUTURE) ×4
SUT PROLENE 4 0 PS 2 18 (SUTURE) ×2 IMPLANT
SUT VIC AB 0 CT1 36 (SUTURE) IMPLANT
SUT VIC AB 2-0 CT1 (SUTURE) ×4 IMPLANT
SUT VIC AB 2-0 SH 27 (SUTURE)
SUT VIC AB 2-0 SH 27XBRD (SUTURE) IMPLANT
SUTURE FIBERWR 2-0 18 17.9 3/8 (SUTURE) ×2 IMPLANT
SWABSTK COMLB BENZOIN TINCTURE (MISCELLANEOUS) ×2 IMPLANT
SYR 10ML LL (SYRINGE) IMPLANT
SYR 5ML LL (SYRINGE) IMPLANT

## 2019-03-29 NOTE — Discharge Instructions (Addendum)
AMBULATORY SURGERY  DISCHARGE INSTRUCTIONS   1) The drugs that you were given will stay in your system until tomorrow so for the next 24 hours you should not:  A) Drive an automobile B) Make any legal decisions C) Drink any alcoholic beverage   2) You may resume regular meals tomorrow.  Today it is better to start with liquids and gradually work up to solid foods.  You may eat anything you prefer, but it is better to start with liquids, then soup and crackers, and gradually work up to solid foods.   3) Please notify your doctor immediately if you have any unusual bleeding, trouble breathing, redness and pain at the surgery site, drainage, fever, or pain not relieved by medication.    4) Additional Instructions:        Please contact your physician with any problems or Same Day Surgery at [phone], Monday through Friday 6 am to 4 pm, or [place] [facility] number at [phone].Orthopedic discharge instructions: Keep dressing dry and intact.  Apply ice frequently to ankle. Keep CAM walker boot on at all times. Take ibuprofen 600-800 mg TID with meals for 7-10 days, then as necessary. Take oxycodone as prescribed when needed for breakthrough pain.  No weight-bearing on left foot - use crutches or walker for ambulation. Follow-up in 10-14 days or as scheduled.

## 2019-03-29 NOTE — H&P (Signed)
Paper H&P to be scanned into permanent record. H&P reviewed and patient re-examined. No changes. 

## 2019-03-29 NOTE — Anesthesia Procedure Notes (Signed)
Procedure Name: LMA Insertion Date/Time: 03/29/2019 7:37 AM Performed by: Lynden Oxford, CRNA Pre-anesthesia Checklist: Patient identified, Emergency Drugs available, Suction available and Patient being monitored Patient Re-evaluated:Patient Re-evaluated prior to induction Oxygen Delivery Method: Circle system utilized Preoxygenation: Pre-oxygenation with 100% oxygen Induction Type: IV induction Ventilation: Mask ventilation without difficulty LMA: LMA inserted LMA Size: 4.5 Tube type: Oral Number of attempts: 1 Airway Equipment and Method: Patient positioned with wedge pillow Placement Confirmation: positive ETCO2 and CO2 detector Tube secured with: Tape

## 2019-03-29 NOTE — Anesthesia Preprocedure Evaluation (Signed)
Anesthesia Evaluation  Patient identified by MRN, date of birth, ID band Patient awake    Reviewed: Allergy & Precautions, NPO status , Patient's Chart, lab work & pertinent test results  History of Anesthesia Complications Negative for: history of anesthetic complications  Airway Mallampati: II  TM Distance: >3 FB Neck ROM: Full    Dental  (+) Dental Advidsory Given, Teeth Intact   Pulmonary neg pulmonary ROS, neg shortness of breath, neg sleep apnea, neg COPD, neg recent URI,    breath sounds clear to auscultation- rhonchi (-) wheezing      Cardiovascular hypertension, Pt. on medications (-) angina(-) CAD, (-) Past MI, (-) Cardiac Stents and (-) CABG  Rhythm:Regular Rate:Normal - Systolic murmurs and - Diastolic murmurs    Neuro/Psych  Headaches, neg Seizures Anxiety    GI/Hepatic Neg liver ROS, GERD  ,  Endo/Other  negative endocrine ROSneg diabetes  Renal/GU negative Renal ROS     Musculoskeletal  (+) Arthritis ,   Abdominal (+) - obese,   Peds  Hematology negative hematology ROS (+)   Anesthesia Other Findings Past Medical History: No date: Anxiety No date: Carpal tunnel syndrome     Comment:  bilateral No date: DDD (degenerative disc disease), lumbar No date: Generalized headaches     Comment:  from neck injury No date: GERD (gastroesophageal reflux disease) No date: Hypertension No date: Neck pain No date: Neuromuscular disorder (HCC) No date: Varicose veins   Reproductive/Obstetrics                             Anesthesia Physical  Anesthesia Plan  ASA: II  Anesthesia Plan: General   Post-op Pain Management:    Induction: Intravenous  PONV Risk Score and Plan: 1 and Ondansetron, Dexamethasone and Midazolam  Airway Management Planned: LMA and Oral ETT  Additional Equipment:   Intra-op Plan:   Post-operative Plan: Extubation in OR  Informed Consent: I have  reviewed the patients History and Physical, chart, labs and discussed the procedure including the risks, benefits and alternatives for the proposed anesthesia with the patient or authorized representative who has indicated his/her understanding and acceptance.     Dental advisory given  Plan Discussed with: CRNA and Anesthesiologist  Anesthesia Plan Comments:         Anesthesia Quick Evaluation

## 2019-03-29 NOTE — Transfer of Care (Signed)
Immediate Anesthesia Transfer of Care Note  Patient: Gary Schultz  Procedure(s) Performed: REPAIR OF PERONEUS BREVIS TENDON AND  REPAIR OF CALCANEOFIBULAR LIGAMENT OF LEFT ANKLE. (Left Ankle)  Patient Location: PACU  Anesthesia Type:General  Level of Consciousness: drowsy  Airway & Oxygen Therapy: Patient Spontanous Breathing and Patient connected to face mask oxygen  Post-op Assessment: Report given to RN and Post -op Vital signs reviewed and stable  Post vital signs: Reviewed and stable  Last Vitals:  Vitals Value Taken Time  BP 105/69 03/29/19 1015  Temp    Pulse 78 03/29/19 1018  Resp 14   SpO2 93 % 03/29/19 1018  Vitals shown include unvalidated device data.  Last Pain:  Vitals:   03/29/19 1013  TempSrc:   PainSc: (P) Asleep         Complications: No apparent anesthesia complications
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
## 2019-03-30 NOTE — Anesthesia Postprocedure Evaluation (Signed)
Anesthesia Post Note  Patient: Gary Schultz  Procedure(s) Performed: Debridement of chronic peroneus brevis tendinopathy with reconstruction of calcaneofibular and anterior talofibular ligaments (Left Ankle)  Patient location during evaluation: PACU Anesthesia Type: General Level of consciousness: awake and alert Pain management: pain level controlled Vital Signs Assessment: post-procedure vital signs reviewed and stable Respiratory status: spontaneous breathing, nonlabored ventilation, respiratory function stable and patient connected to nasal cannula oxygen Cardiovascular status: blood pressure returned to baseline and stable Postop Assessment: no apparent nausea or vomiting Anesthetic complications: no     Last Vitals:  Vitals:   03/29/19 1145 03/29/19 1210  BP: 122/77 122/75  Pulse: 95 78  Resp: 18 18  Temp:    SpO2: 96% 98%    Last Pain:  Vitals:   03/30/19 0836  TempSrc:   PainSc: 6                  Lenard Simmer

## 2019-05-05 ENCOUNTER — Other Ambulatory Visit: Payer: Self-pay | Admitting: Family Medicine

## 2019-05-05 DIAGNOSIS — R142 Eructation: Secondary | ICD-10-CM

## 2019-05-05 DIAGNOSIS — I10 Essential (primary) hypertension: Secondary | ICD-10-CM

## 2019-07-29 ENCOUNTER — Other Ambulatory Visit: Payer: Self-pay

## 2019-07-29 ENCOUNTER — Ambulatory Visit (INDEPENDENT_AMBULATORY_CARE_PROVIDER_SITE_OTHER): Payer: Self-pay | Admitting: Family Medicine

## 2019-07-29 ENCOUNTER — Encounter: Payer: Self-pay | Admitting: Family Medicine

## 2019-07-29 VITALS — BP 136/87 | HR 86 | Temp 98.0°F | Resp 16 | Ht 73.0 in | Wt 214.6 lb

## 2019-07-29 DIAGNOSIS — M47812 Spondylosis without myelopathy or radiculopathy, cervical region: Secondary | ICD-10-CM

## 2019-07-29 DIAGNOSIS — F0781 Postconcussional syndrome: Secondary | ICD-10-CM

## 2019-07-29 DIAGNOSIS — G44329 Chronic post-traumatic headache, not intractable: Secondary | ICD-10-CM

## 2019-07-29 DIAGNOSIS — R413 Other amnesia: Secondary | ICD-10-CM

## 2019-07-29 NOTE — Patient Instructions (Addendum)
Thank you for coming to the office today.  Referral to Neurologist today. They will discuss further testing of your head including imaging and concussion.  [facility] - Neurology Dept [address] [place], [place] [address] Phone: [phone]   Please schedule a Follow-up Appointment to: Return if symptoms worsen or fail to improve, for headaches, nerve pain.  If you have any other questions or concerns, please feel free to call the office or send a message through MyChart. You may also schedule an earlier appointment if necessary.  Additionally, you may be receiving a survey about your experience at our office within a few days to 1 week by e-mail or mail. We value your feedback.  Saralyn Pilar, DO [facility] [place], [place]
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
## 2019-08-31 ENCOUNTER — Other Ambulatory Visit: Payer: Self-pay | Admitting: Acute Care

## 2019-08-31 ENCOUNTER — Other Ambulatory Visit: Payer: Self-pay | Admitting: Family Medicine

## 2019-08-31 DIAGNOSIS — M542 Cervicalgia: Secondary | ICD-10-CM

## 2019-08-31 DIAGNOSIS — I10 Essential (primary) hypertension: Secondary | ICD-10-CM

## 2019-08-31 DIAGNOSIS — R413 Other amnesia: Secondary | ICD-10-CM

## 2019-08-31 NOTE — Telephone Encounter (Signed)
Requested medication (s) are due for refill today: yes  Requested medication (s) are on the active medication list: yes  Last refill:  02/28/19  #90  1 refill  Future visit scheduled: No  Notes to clinic: Called and gentleman answered stating he has never heard of Dr Althea Charon. Gentleman answered to patient name. Needs appointment last OV for BP 01/20/19    Requested Prescriptions  Pending Prescriptions Disp Refills   amLODipine (NORVASC) 5 MG tablet [Pharmacy Med Name: AMLODIPINE BESYLATE 5 MG TAB] 90 tablet 1    Sig: TAKE 1 TABLET BY MOUTH ONCE DAILY      Cardiovascular:  Calcium Channel Blockers Passed - 08/31/2019 12:08 PM      Passed - Last BP in normal range    BP Readings from Last 1 Encounters:  07/29/19 136/87          Passed - Valid encounter within last 6 months    Recent Outpatient Visits           1 month ago Chronic post-traumatic headache, not intractable   [facility] [place], Netta Neat, DO   7 months ago Atherosclerosis of right carotid artery   [facility] [place], Netta Neat, DO   2 years ago Acute non-recurrent maxillary sinusitis   [facility] [place], Netta Neat, DO   2 years ago Chest pain in adult   [facility] [place], Alison Stalling, NP   3 years ago Pain in lateral portion of left knee   [facility] [place], Netta Neat, [place]

## 2019-09-23 ENCOUNTER — Ambulatory Visit
Admission: RE | Admit: 2019-09-23 | Discharge: 2019-09-23 | Disposition: A | Discharge status: Home / Self Care | Payer: Self-pay | Source: Ambulatory Visit | Attending: Acute Care | Admitting: Acute Care

## 2019-09-23 ENCOUNTER — Other Ambulatory Visit: Payer: Self-pay

## 2019-09-23 DIAGNOSIS — M542 Cervicalgia: Secondary | ICD-10-CM

## 2019-09-23 DIAGNOSIS — R413 Other amnesia: Secondary | ICD-10-CM | POA: Insufficient documentation

## 2019-11-01 ENCOUNTER — Other Ambulatory Visit: Payer: Self-pay | Admitting: Family Medicine

## 2019-11-01 ENCOUNTER — Ambulatory Visit: Payer: Self-pay | Admitting: Family Medicine

## 2019-11-01 DIAGNOSIS — R142 Eructation: Secondary | ICD-10-CM

## 2019-11-01 DIAGNOSIS — I10 Essential (primary) hypertension: Secondary | ICD-10-CM

## 2019-12-14 ENCOUNTER — Other Ambulatory Visit: Payer: Self-pay | Admitting: Surgery

## 2019-12-16 ENCOUNTER — Other Ambulatory Visit: Payer: Self-pay

## 2019-12-16 ENCOUNTER — Encounter
Admission: RE | Admit: 2019-12-16 | Discharge: 2019-12-16 | Disposition: A | Discharge status: Home / Self Care | Payer: No Typology Code available for payment source | Source: Ambulatory Visit | Attending: Surgery | Admitting: Surgery

## 2019-12-16 NOTE — Patient Instructions (Signed)
Your procedure is scheduled on: Thursday December 22, 2019. Report to Day Surgery inside [facility] 2nd floor. To find out your arrival time please call [phone] between 1PM - 3PM on Wednesday December 21, 2019.  Remember: Instructions that are not followed completely may result in serious medical risk,  up to and including death, or upon the discretion of your surgeon and anesthesiologist your  surgery may need to be rescheduled.     _X__ 1. Do not eat food after midnight the night before your procedure.                 No chewing gum or hard candies. You may drink clear liquids up to 2 hours                 before you are scheduled to arrive for your surgery- DO not drink clear                 liquids within 2 hours of the start of your surgery.                 Clear Liquids include:  water, apple juice without pulp, clear Gatorade, G2 or                  Gatorade Zero (avoid Red/Purple/Blue), Black Coffee or Tea (Do not add                 anything to coffee or tea).  __X__2.   Complete the "Ensure Clear Pre-surgery Clear Carbohydrate Drink" provided to you, 2 hours before arrival. If you are diabetic you will be provided with an alternative drink, Gatorade Zero or G2.  __X__3.  On the morning of surgery brush your teeth with toothpaste and water, you                may rinse your mouth with mouthwash if you wish.  Do not swallow any toothpaste of mouthwash.     _X__ 4.  No Alcohol for 24 hours before or after surgery.   _X__ 5.  Do Not Smoke or use e-cigarettes For 24 Hours Prior to Your Surgery.                 Do not use any chewable tobacco products for at least 6 hours prior to                 Surgery.  _X__  6.  Do not use any recreational drugs (marijuana, cocaine, heroin, ecstasy, MDMA or other)                For at least one week prior to your surgery.  Combination of these drugs with anesthesia                May have life threatening  results.  __X__  7.  Notify your doctor if there is any change in your medical condition      (cold, fever, infections).     Do not wear jewelry, make-up, hairpins, clips or nail polish. Do not wear lotions, powders, or perfumes. You may wear deodorant. Do not shave 48 hours prior to surgery. Men may shave face and neck. Do not bring valuables to the hospital.    [facility] is not responsible for any belongings or valuables.  Contacts, dentures or bridgework may not be worn into surgery. Leave your suitcase in the car. After surgery it may be brought to your room. For  patients admitted to the hospital, discharge time is determined by your treatment team.   Patients discharged the day of surgery will not be allowed to drive home.   Make arrangements for someone to be with you for the first 24 hours of your Same Day Discharge.   __x__ Take these medicines the morning of surgery with A SIP OF WATER:    1. omeprazole (PRILOSEC) 20 MG  2. amLODipine (NORVASC) 5 MG   ____ Fleet Enema (as directed)   __X__ Use CHG Soap (or wipes) as directed  ____ Use Benzoyl Peroxide Gel as instructed  ____ Use inhalers on the day of surgery  ____ Stop metformin 2 days prior to surgery    ____ Take 1/2 of usual insulin dose the night before surgery. No insulin the morning          of surgery.   __X__ Stop Anti-inflammatories such as Ibuprofen, Aleve, Advil, naproxen, aspirin and or BC powders.    _X___ Stop supplements until after surgery. Ascorbic Acid (VITAMIN C, TURMERIC, GINSENG and Misc Natural Products (PROSTATE HEALTH)   __X__ Do not start any herbal supplements before your procedure.    If you have any questions regarding your pre-procedure instructions,  Please call Pre-admit Testing at [phone].

## 2019-12-20 ENCOUNTER — Encounter
Admission: RE | Admit: 2019-12-20 | Discharge: 2019-12-20 | Disposition: A | Discharge status: Home / Self Care | Payer: 59 | Source: Ambulatory Visit | Attending: Surgery | Admitting: Surgery

## 2019-12-20 ENCOUNTER — Other Ambulatory Visit: Payer: Self-pay

## 2019-12-20 DIAGNOSIS — Z01818 Encounter for other preprocedural examination: Secondary | ICD-10-CM | POA: Insufficient documentation

## 2019-12-20 DIAGNOSIS — Z20822 Contact with and (suspected) exposure to covid-19: Secondary | ICD-10-CM | POA: Diagnosis not present

## 2019-12-20 DIAGNOSIS — I10 Essential (primary) hypertension: Secondary | ICD-10-CM | POA: Diagnosis not present

## 2019-12-20 LAB — BASIC METABOLIC PANEL
Anion gap: 9 (ref 5–15)
BUN: 22 mg/dL — ABNORMAL HIGH (ref 6–20)
CO2: 25 mmol/L (ref 22–32)
Calcium: 9.2 mg/dL (ref 8.9–10.3)
Chloride: 102 mmol/L (ref 98–111)
Creatinine, Ser: 1.09 mg/dL (ref 0.61–1.24)
GFR, Estimated: >60 mL/min (ref >60)
Glucose, Bld: 101 mg/dL — ABNORMAL HIGH (ref 70–99)
Potassium: 4 mmol/L (ref 3.5–5.1)
Sodium: 136 mmol/L (ref 135–145)

## 2019-12-20 LAB — CBC
HCT: 36.7 % — ABNORMAL LOW (ref 39.0–52.0)
Hemoglobin: 12.9 g/dL — ABNORMAL LOW (ref 13.0–17.0)
MCH: 30.8 pg (ref 26.0–34.0)
MCHC: 35.1 g/dL (ref 30.0–36.0)
MCV: 87.6 fL (ref 80.0–100.0)
Platelets: 177 10*3/uL (ref 150–400)
RBC: 4.19 MIL/uL — ABNORMAL LOW (ref 4.22–5.81)
RDW: 12.1 % (ref 11.5–15.5)
WBC: 6 10*3/uL (ref 4.0–10.5)
nRBC: 0 % (ref 0.0–0.2)

## 2019-12-20 LAB — SARS CORONAVIRUS 2 (TAT 6-24 HRS): SARS Coronavirus 2: NEGATIVE

## 2019-12-22 ENCOUNTER — Ambulatory Visit: Payer: 59 | Admitting: Anesthesiology

## 2019-12-22 ENCOUNTER — Other Ambulatory Visit: Payer: Self-pay

## 2019-12-22 ENCOUNTER — Encounter: Payer: Self-pay | Admitting: Surgery

## 2019-12-22 ENCOUNTER — Encounter
Admission: RE | Disposition: A | Discharge status: Home / Self Care | Payer: Self-pay | Source: Home / Self Care | Attending: Surgery

## 2019-12-22 ENCOUNTER — Ambulatory Visit
Admission: RE | Admit: 2019-12-22 | Discharge: 2019-12-22 | Disposition: A | Discharge status: Home / Self Care | Payer: 59 | Attending: Surgery | Admitting: Surgery

## 2019-12-22 DIAGNOSIS — G5603 Carpal tunnel syndrome, bilateral upper limbs: Secondary | ICD-10-CM | POA: Diagnosis not present

## 2019-12-22 DIAGNOSIS — Z886 Allergy status to analgesic agent status: Secondary | ICD-10-CM | POA: Insufficient documentation

## 2019-12-22 DIAGNOSIS — Z79899 Other long term (current) drug therapy: Secondary | ICD-10-CM | POA: Insufficient documentation

## 2019-12-22 HISTORY — PX: CARPAL TUNNEL RELEASE: SHX101

## 2019-12-22 SURGERY — RELEASE, CARPAL TUNNEL, ENDOSCOPIC
Anesthesia: General | Site: Wrist | Laterality: Right

## 2019-12-22 MED ORDER — CEFAZOLIN SODIUM-DEXTROSE 2-4 GM/100ML-% IV SOLN
2.0000 g | INTRAVENOUS | Status: AC
  Administered 2019-12-22: 2 g via INTRAVENOUS

## 2019-12-22 MED ORDER — PROPOFOL 10 MG/ML IV BOLUS
INTRAVENOUS | Status: DC | PRN
  Administered 2019-12-22: 200 mg via INTRAVENOUS

## 2019-12-22 MED ORDER — MIDAZOLAM HCL 2 MG/2ML IJ SOLN
INTRAMUSCULAR | Status: DC | PRN
  Administered 2019-12-22: 2 mg via INTRAVENOUS

## 2019-12-22 MED ORDER — CEFAZOLIN SODIUM-DEXTROSE 2-4 GM/100ML-% IV SOLN
INTRAVENOUS | Status: AC
  Filled 2019-12-22: qty 100

## 2019-12-22 MED ORDER — CHLORHEXIDINE GLUCONATE 0.12 % MT SOLN
OROMUCOSAL | Status: AC
  Filled 2019-12-22: qty 15

## 2019-12-22 MED ORDER — DEXMEDETOMIDINE HCL 200 MCG/2ML IV SOLN
INTRAVENOUS | Status: DC | PRN
  Administered 2019-12-22: 12 ug via INTRAVENOUS

## 2019-12-22 MED ORDER — OXYCODONE HCL 5 MG/5ML PO SOLN: 5.0000 mg | Freq: Once | ORAL | Status: DC | PRN

## 2019-12-22 MED ORDER — CHLORHEXIDINE GLUCONATE 0.12 % MT SOLN: 15.0000 mL | Freq: Once | OROMUCOSAL | Status: DC

## 2019-12-22 MED ORDER — MEPERIDINE HCL 50 MG/ML IJ SOLN: 6.2500 mg | INTRAMUSCULAR | Status: DC | PRN

## 2019-12-22 MED ORDER — OXYCODONE HCL 5 MG PO TABS: 5.0000 mg | ORAL_TABLET | Freq: Once | ORAL | Status: DC | PRN

## 2019-12-22 MED ORDER — FENTANYL CITRATE (PF) 100 MCG/2ML IJ SOLN: 25.0000 ug | INTRAMUSCULAR | Status: DC | PRN

## 2019-12-22 MED ORDER — LIDOCAINE HCL (CARDIAC) PF 100 MG/5ML IV SOSY
PREFILLED_SYRINGE | INTRAVENOUS | Status: DC | PRN
  Administered 2019-12-22: 100 mg via INTRAVENOUS

## 2019-12-22 MED ORDER — ORAL CARE MOUTH RINSE: 15.0000 mL | Freq: Once | OROMUCOSAL | Status: DC

## 2019-12-22 MED ORDER — PROMETHAZINE HCL 25 MG/ML IJ SOLN: 6.2500 mg | INTRAMUSCULAR | Status: DC | PRN

## 2019-12-22 MED ORDER — DEXMEDETOMIDINE (PRECEDEX) IN NS 20 MCG/5ML (4 MCG/ML) IV SYRINGE
PREFILLED_SYRINGE | INTRAVENOUS | Status: AC
  Filled 2019-12-22: qty 5

## 2019-12-22 MED ORDER — PROPOFOL 10 MG/ML IV BOLUS
INTRAVENOUS | Status: AC
  Filled 2019-12-22: qty 20

## 2019-12-22 MED ORDER — FENTANYL CITRATE (PF) 100 MCG/2ML IJ SOLN
INTRAMUSCULAR | Status: AC
  Filled 2019-12-22: qty 2

## 2019-12-22 MED ORDER — FENTANYL CITRATE (PF) 100 MCG/2ML IJ SOLN
INTRAMUSCULAR | Status: DC | PRN
  Administered 2019-12-22: 100 ug via INTRAVENOUS
  Administered 2019-12-22 (×2): 50 ug via INTRAVENOUS

## 2019-12-22 MED ORDER — EPHEDRINE SULFATE 50 MG/ML IJ SOLN
INTRAMUSCULAR | Status: DC | PRN
  Administered 2019-12-22: 5 mg via INTRAVENOUS

## 2019-12-22 MED ORDER — ONDANSETRON HCL 4 MG/2ML IJ SOLN
INTRAMUSCULAR | Status: DC | PRN
  Administered 2019-12-22: 4 mg via INTRAVENOUS

## 2019-12-22 MED ORDER — MIDAZOLAM HCL 2 MG/2ML IJ SOLN
INTRAMUSCULAR | Status: AC
  Filled 2019-12-22: qty 2

## 2019-12-22 MED ORDER — BUPIVACAINE HCL (PF) 0.5 % IJ SOLN
INTRAMUSCULAR | Status: DC | PRN
  Administered 2019-12-22: 10 mL

## 2019-12-22 MED ORDER — LACTATED RINGERS IV SOLN: INTRAVENOUS | Status: DC

## 2019-12-22 SURGICAL SUPPLY — 32 items
APL PRP STRL LF DISP 70% ISPRP (MISCELLANEOUS) ×1
BNDG COHESIVE 4X5 TAN STRL (GAUZE/BANDAGES/DRESSINGS) ×2 IMPLANT
BNDG ELASTIC 2X5.8 VLCR STR LF (GAUZE/BANDAGES/DRESSINGS) ×2 IMPLANT
BNDG ESMARK 4X12 TAN STRL LF (GAUZE/BANDAGES/DRESSINGS) ×2 IMPLANT
CANISTER SUCT 1200ML W/VALVE (MISCELLANEOUS) ×2 IMPLANT
CHLORAPREP W/TINT 26 (MISCELLANEOUS) ×2 IMPLANT
CORD BIP STRL DISP 12FT (MISCELLANEOUS) ×2 IMPLANT
COVER WAND RF STERILE (DRAPES) ×2 IMPLANT
CUFF TOURN SGL QUICK 18X4 (TOURNIQUET CUFF) ×2 IMPLANT
DRAPE SURG 17X11 SM STRL (DRAPES) ×2 IMPLANT
FORCEPS JEWEL BIP 4-3/4 STR (INSTRUMENTS) ×2 IMPLANT
GAUZE SPONGE 4X4 12PLY STRL (GAUZE/BANDAGES/DRESSINGS) ×2 IMPLANT
GAUZE XEROFORM 1X8 LF (GAUZE/BANDAGES/DRESSINGS) ×2 IMPLANT
GLOVE BIO SURGEON STRL SZ8 (GLOVE) ×2 IMPLANT
GLOVE INDICATOR 8.0 STRL GRN (GLOVE) ×2 IMPLANT
GOWN STRL REUS W/ TWL LRG LVL3 (GOWN DISPOSABLE) ×1 IMPLANT
GOWN STRL REUS W/ TWL XL LVL3 (GOWN DISPOSABLE) ×1 IMPLANT
GOWN STRL REUS W/TWL LRG LVL3 (GOWN DISPOSABLE) ×2
GOWN STRL REUS W/TWL XL LVL3 (GOWN DISPOSABLE) ×2
KIT CARPAL TUNNEL (MISCELLANEOUS) ×2
KIT ESCP INSRT D SLOT CANN KN (MISCELLANEOUS) ×1 IMPLANT
KIT TURNOVER KIT A (KITS) ×2 IMPLANT
NS IRRIG 500ML POUR BTL (IV SOLUTION) ×2 IMPLANT
PACK EXTREMITY (MISCELLANEOUS) ×2 IMPLANT
SPLINT WRIST LG LT TX990309 (SOFTGOODS) IMPLANT
SPLINT WRIST LG RT TX900304 (SOFTGOODS) IMPLANT
SPLINT WRIST M LT TX990308 (SOFTGOODS) IMPLANT
SPLINT WRIST M RT TX990303 (SOFTGOODS) IMPLANT
SPLINT WRIST XL LT TX990310 (SOFTGOODS) IMPLANT
SPLINT WRIST XL RT TX990305 (SOFTGOODS) ×2 IMPLANT
STOCKINETTE IMPERVIOUS 9X36 MD (GAUZE/BANDAGES/DRESSINGS) ×2 IMPLANT
SUT PROLENE 4 0 PS 2 18 (SUTURE) ×2 IMPLANT
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
## 2019-12-22 NOTE — Anesthesia Procedure Notes (Signed)
Procedure Name: LMA Insertion Date/Time: 12/22/2019 1:00 PM Performed by: Clyde Lundborg, CRNA Pre-anesthesia Checklist: Patient identified, Emergency Drugs available, Suction available and Patient being monitored Patient Re-evaluated:Patient Re-evaluated prior to induction Oxygen Delivery Method: Circle system utilized Preoxygenation: Pre-oxygenation with 100% oxygen Induction Type: IV induction Ventilation: Mask ventilation without difficulty LMA: LMA inserted LMA Size: 4.0 Tube type: Oral Number of attempts: 1 Placement Confirmation: positive ETCO2,  breath sounds checked- equal and bilateral and CO2 detector Tube secured with: Tape Dental Injury: Teeth and Oropharynx as per pre-operative assessment

## 2019-12-22 NOTE — Transfer of Care (Signed)
Immediate Anesthesia Transfer of Care Note  Patient: Gary Schultz  Procedure(s) Performed: CARPAL TUNNEL RELEASE ENDOSCOPIC (Right Wrist)  Patient Location: PACU  Anesthesia Type:General  Level of Consciousness: awake, alert  and oriented  Airway & Oxygen Therapy: Patient Spontanous Breathing and Patient connected to face mask oxygen  Post-op Assessment: Report given to RN and Post -op Vital signs reviewed and stable  Post vital signs: Reviewed and stable  Last Vitals:  Vitals Value Taken Time  BP    Temp    Pulse    Resp    SpO2      Last Pain:  Vitals:   12/22/19 1047  TempSrc: Temporal  PainSc: 6          Complications: No complications documented.

## 2019-12-22 NOTE — Anesthesia Postprocedure Evaluation (Signed)
Anesthesia Post Note  Patient: Gary Schultz  Procedure(s) Performed: CARPAL TUNNEL RELEASE ENDOSCOPIC (Right Wrist)  Patient location during evaluation: Phase II Anesthesia Type: General Level of consciousness: awake and alert Pain management: pain level controlled Vital Signs Assessment: post-procedure vital signs reviewed and stable Respiratory status: spontaneous breathing, nonlabored ventilation and respiratory function stable Cardiovascular status: blood pressure returned to baseline and stable Postop Assessment: no apparent nausea or vomiting Anesthetic complications: no   No complications documented.   Last Vitals:  Vitals:   12/22/19 1408 12/22/19 1425  BP: 113/80 119/86  Pulse: 63 61  Resp: 11 14  Temp: 36.6 C (!) 36.2 C  SpO2: 98% 98%    Last Pain:  Vitals:   12/22/19 1425  TempSrc: Temporal  PainSc: 4                  Jakory Matsuo Garry Heater

## 2019-12-22 NOTE — Op Note (Signed)
12/22/2019  1:42 PM  Patient:   Gary Schultz  Pre-Op Diagnosis:   Right carpal tunnel syndrome.  Post-Op Diagnosis:   Same.  Procedure:   Endoscopic right carpal tunnel release.  Surgeon:   Maryagnes Amos, MD  Anesthesia:   General LMA  Findings:   As above.  Complications:   None  EBL:   0 cc  Fluids:   900 cc crystalloid  TT:   13 minutes at 250 mmHg  Drains:   None  Closure:   4-0 Prolene interrupted sutures  Brief Clinical Note:   The patient is a 54 year old male with a long history of progressively worsening bilateral hand and wrist pain and paresthesias, right more symptomatic than left. His symptoms are consistent with carpal tunnel syndrome. The patient presents at this time for an endoscopic right carpal tunnel release.   Procedure:   The patient was brought into the operating room and lain in the supine position. After adequate general laryngeal mask anesthesia was obtained, the right hand and upper extremity were prepped with ChloraPrep solution before being draped sterilely. Preoperative antibiotics were administered. A timeout was performed to verify the appropriate surgical site before the limb was exsanguinated with an Esmarch and the tourniquet inflated to 250 mmHg. An approximately 1.5-2 cm incision was made over the volar wrist flexion crease, centered over the palmaris longus tendon. The incision was carried down through the subcutaneous tissues with care taken to identify and protect any neurovascular structures. The distal forearm fascia was penetrated just proximal to the transverse carpal ligament. The soft tissues were released off the superficial and deep surfaces of the distal forearm fascia and this was released proximally for 3-4 cm under direct visualization.  Attention was directed distally. The Therapist, nutritional was passed beneath the transverse carpal ligament along the ulnar aspect of the carpal tunnel and used to release any adhesions as well as  to remove any adherent synovial tissue before first the smaller then the larger of the two dilators were passed beneath the transverse carpal ligament along the ulnar margin of the carpal tunnel. The slotted cannula was introduced and the endoscope was placed into the slotted cannula and the undersurface of the transverse carpal ligament visualized. The distal margin of the transverse carpal ligament was marked by placing a 25-gauge needle percutaneously at Kaplan's cardinal point so that it entered the distal portion of the slotted cannula. Under endoscopic visualization, the transverse carpal ligament was released from proximal to distal using the end-cutting blade. A second pass was performed to ensure complete release of the ligament. The adequacy of release was verified both endoscopically and by palpation using the freer elevator.  The wound was irrigated thoroughly with sterile saline solution before being closed using 4-0 Prolene interrupted sutures. A total of 10 cc of 0.5% plain Sensorcaine was injected in and around the incision before a sterile bulky dressing was applied to the wound. The patient was placed into a volar wrist splint before being awakened and returned to the recovery room in satisfactory condition after tolerating the procedure well.

## 2019-12-22 NOTE — Discharge Instructions (Addendum)
       [facility] PERIOPERATIVE AREA [address] [place], [place]  [address] Phone:  [phone]   December 22, 2019  Patient: Gary Schultz  Date of Birth: November 15, 1965  Date of Visit: December 22, 2019    To Whom It May Concern:  Laterrian Hevener was seen and treated on December 22, 2019 and    cannot return to work until 12/24/2019 due to anesthesia medication administered.              If you have any questions or concerns, please don't hesitate to call.   Sincerely,  Dionisio Paschal, RN Same Say Surgery for Dr. Joice Lofts Surgeon.              Orthopedic discharge instructions: Keep dressing dry and intact. Keep hand elevated above heart level. May shower after dressing removed on postop day 4 (Monday). Cover sutures with Band-Aids after drying off. Apply ice to affected area frequently. Take ibuprofen 600-800 mg TID with meals for 7-10 days, then as necessary. Take ES Tylenol or pain medication as prescribed when needed.  Return for follow-up in 10-14 days or as scheduled.   AMBULATORY SURGERY  DISCHARGE INSTRUCTIONS   1) The drugs that you were given will stay in your system until tomorrow so for the next 24 hours you should not:  A) Drive an automobile B) Make any legal decisions C) Drink any alcoholic beverage   2) You may resume regular meals tomorrow.  Today it is better to start with liquids and gradually work up to solid foods.  You may eat anything you prefer, but it is better to start with liquids, then soup and crackers, and gradually work up to solid foods.   3) Please notify your doctor immediately if you have any unusual bleeding, trouble breathing, redness and pain at the surgery site, drainage, fever, or pain not relieved by medication.    4) Additional Instructions:        Please contact your physician with any problems or Same Day Surgery at [phone], Monday through Friday 6 am to 4 pm, or [facility] number at [phone].

## 2019-12-23 ENCOUNTER — Encounter: Payer: Self-pay | Admitting: Surgery

## 2019-12-23 NOTE — Progress Notes (Addendum)
  [facility] PERIOPERATIVE AREA [address] [place], [place]  [address] Phone:  [phone]   December 22, 2019   Patient: Gary Schultz  Date of Birth: [birth year]  Date of Visit: December 22, 2019    To Whom It May Concern:  Gary Schultz was seen and treated on December 22, 2019 and    cannot return to work until 12/24/2019 due to anesthesia medication administered.              If you have any questions or concerns, please don't hesitate to call.   Sincerely,  Dionisio Paschal, RN [facility] Surgery for Dr. Joice Lofts Surgeon.

## 2020-01-04 ENCOUNTER — Other Ambulatory Visit: Payer: Self-pay | Admitting: Family Medicine

## 2020-01-04 DIAGNOSIS — I10 Essential (primary) hypertension: Secondary | ICD-10-CM

## 2020-01-04 DIAGNOSIS — R142 Eructation: Secondary | ICD-10-CM

## 2020-01-04 MED ORDER — AMLODIPINE BESYLATE 5 MG PO TABS: 5.0000 mg | ORAL_TABLET | Freq: Every day | ORAL | 0 refills | Status: DC

## 2020-01-04 MED ORDER — OMEPRAZOLE 20 MG PO CPDR: DELAYED_RELEASE_CAPSULE | ORAL | 0 refills | Status: DC

## 2020-01-04 NOTE — Telephone Encounter (Signed)
Pt called in to states HAS  [facility] pharmacy [facility]. Please send med there

## 2020-02-13 ENCOUNTER — Other Ambulatory Visit: Payer: Self-pay | Admitting: Surgery

## 2020-02-22 ENCOUNTER — Other Ambulatory Visit: Payer: Self-pay

## 2020-02-22 ENCOUNTER — Encounter
Admission: RE | Admit: 2020-02-22 | Discharge: 2020-02-22 | Disposition: A | Discharge status: Home / Self Care | Payer: No Typology Code available for payment source | Source: Ambulatory Visit | Attending: Surgery | Admitting: Surgery

## 2020-02-22 NOTE — Patient Instructions (Addendum)
Your procedure is scheduled on:  Wednesday, January 12 Report to the Registration Desk on the 1st floor of the [facility] To find out your arrival time, please call [phone] between 1PM - 3PM on: Tuesday, January 11  REMEMBER: Instructions that are not followed completely may result in serious medical risk, up to and including death; or upon the discretion of your surgeon and anesthesiologist your surgery may need to be rescheduled.  Do not eat food after midnight the night before surgery.  No gum chewing, lozengers or hard candies.  You may however, drink CLEAR liquids up to 2 hours before you are scheduled to arrive for your surgery. Do not drink anything within 2 hours of your scheduled arrival time.  Clear liquids include: - water  - apple juice without pulp - gatorade (not RED, PURPLE, OR BLUE) - black coffee or tea (Do NOT add milk or creamers to the coffee or tea) Do NOT drink anything that is not on this list.  In addition, your doctor has ordered for you to drink the provided  Ensure Pre-Surgery Clear Carbohydrate Drink  Drinking this carbohydrate drink up to two hours before surgery helps to reduce insulin resistance and improve patient outcomes. Please complete drinking 2 hours prior to scheduled arrival time.  TAKE THESE MEDICATIONS THE MORNING OF SURGERY WITH A SIP OF WATER:  1.  Amlodipine 2.  Duloxetine (Cymbalta) 3.  Gabapentin 4.  Omeprazole - (take one the night before and one on the morning of surgery - helps to prevent nausea after surgery.)  One week prior to surgery: starting January 5 Stop Anti-inflammatories (NSAIDS) such as Advil, Aleve, Ibuprofen, Motrin, Naproxen, Naprosyn and Aspirin based products such as Excedrin, Goodys Powder, BC Powder. Stop ANY OVER THE COUNTER supplements until after surgery. (turmeric, prostate health, ginseng, vitamin C, horny goat weed) (However, you may continue taking Vitamin D up until the day before surgery.)  No  Alcohol for 24 hours before or after surgery.  No Smoking including e-cigarettes for 24 hours prior to surgery.  No chewable tobacco products for at least 6 hours prior to surgery.  No nicotine patches on the day of surgery.  Do not use any "recreational" drugs for at least a week prior to your surgery.  Please be advised that the combination of cocaine and anesthesia may have negative outcomes, up to and including death. If you test positive for cocaine, your surgery will be cancelled.  On the morning of surgery brush your teeth with toothpaste and water, you may rinse your mouth with mouthwash if you wish. Do not swallow any toothpaste or mouthwash.  Do not wear jewelry, make-up, hairpins, clips or nail polish.  Do not wear lotions, powders, or perfumes.   Do not shave body from the neck down 48 hours prior to surgery just in case you cut yourself which could leave a site for infection.  Also, freshly shaved skin may become irritated if using the CHG soap.  Contact lenses, hearing aids and dentures may not be worn into surgery.  Do not bring valuables to the hospital. [facility] is not responsible for any missing/lost belongings or valuables.   Use CHG Soap as directed on instruction sheet.  Notify your doctor if there is any change in your medical condition (cold, fever, infection).  Wear comfortable clothing (specific to your surgery type) to the hospital.  Plan for stool softeners for home use; pain medications have a tendency to cause constipation. You can also help  prevent constipation by eating foods high in fiber such as fruits and vegetables and drinking plenty of fluids as your diet allows.  After surgery, you can help prevent lung complications by doing breathing exercises.  Take deep breaths and cough every 1-2 hours. Your doctor may order a device called an Incentive Spirometer to help you take deep breaths.  If you are being discharged the day of surgery, you will  not be allowed to drive home. You will need a responsible adult (18 years or older) to drive you home and stay with you that night.   If you are taking public transportation, you will need to have a responsible adult (18 years or older) with you. Please confirm with your physician that it is acceptable to use public transportation.   Please call the Pre-admissions Testing Dept. at [phone] if you have any questions about these instructions.  Visitation Policy:  Patients undergoing a surgery or procedure may have one family member or support person with them as long as that person is not COVID-19 positive or experiencing its symptoms.  That person may remain in the waiting area during the procedure.

## 2020-02-27 ENCOUNTER — Other Ambulatory Visit
Admission: RE | Admit: 2020-02-27 | Discharge: 2020-02-27 | Disposition: A | Discharge status: Home / Self Care | Payer: 59 | Source: Ambulatory Visit | Attending: Surgery | Admitting: Surgery

## 2020-02-27 ENCOUNTER — Other Ambulatory Visit: Payer: Self-pay

## 2020-02-27 DIAGNOSIS — Z20822 Contact with and (suspected) exposure to covid-19: Secondary | ICD-10-CM | POA: Insufficient documentation

## 2020-02-27 DIAGNOSIS — Z01812 Encounter for preprocedural laboratory examination: Secondary | ICD-10-CM | POA: Diagnosis present

## 2020-02-27 LAB — SARS CORONAVIRUS 2 (TAT 6-24 HRS): SARS Coronavirus 2: NEGATIVE

## 2020-02-29 ENCOUNTER — Ambulatory Visit: Payer: 59 | Admitting: Certified Registered"

## 2020-02-29 ENCOUNTER — Ambulatory Visit
Admission: RE | Admit: 2020-02-29 | Discharge: 2020-02-29 | Disposition: A | Discharge status: Home / Self Care | Payer: 59 | Attending: Surgery | Admitting: Surgery

## 2020-02-29 ENCOUNTER — Encounter: Payer: Self-pay | Admitting: Surgery

## 2020-02-29 ENCOUNTER — Encounter
Admission: RE | Disposition: A | Discharge status: Home / Self Care | Payer: Self-pay | Source: Home / Self Care | Attending: Surgery

## 2020-02-29 ENCOUNTER — Other Ambulatory Visit: Payer: Self-pay

## 2020-02-29 DIAGNOSIS — G5602 Carpal tunnel syndrome, left upper limb: Secondary | ICD-10-CM | POA: Insufficient documentation

## 2020-02-29 DIAGNOSIS — Z79899 Other long term (current) drug therapy: Secondary | ICD-10-CM | POA: Insufficient documentation

## 2020-02-29 HISTORY — PX: CARPAL TUNNEL RELEASE: SHX101

## 2020-02-29 SURGERY — RELEASE, CARPAL TUNNEL, ENDOSCOPIC
Anesthesia: General | Site: Wrist | Laterality: Left

## 2020-02-29 MED ORDER — OXYCODONE HCL 5 MG PO TABS: 10.0000 mg | ORAL_TABLET | Freq: Once | ORAL | Status: DC

## 2020-02-29 MED ORDER — BUPIVACAINE HCL (PF) 0.5 % IJ SOLN
INTRAMUSCULAR | Status: DC | PRN
  Administered 2020-02-29: 10 mL

## 2020-02-29 MED ORDER — MEPERIDINE HCL 50 MG/ML IJ SOLN: 6.2500 mg | INTRAMUSCULAR | Status: DC | PRN

## 2020-02-29 MED ORDER — FENTANYL CITRATE (PF) 100 MCG/2ML IJ SOLN
INTRAMUSCULAR | Status: AC
  Filled 2020-02-29: qty 2

## 2020-02-29 MED ORDER — CHLORHEXIDINE GLUCONATE 0.12 % MT SOLN
OROMUCOSAL | Status: AC
  Administered 2020-02-29: 15 mL via OROMUCOSAL
  Filled 2020-02-29: qty 15

## 2020-02-29 MED ORDER — PROPOFOL 10 MG/ML IV BOLUS
INTRAVENOUS | Status: DC | PRN
  Administered 2020-02-29: 200 mg via INTRAVENOUS

## 2020-02-29 MED ORDER — DEXAMETHASONE SODIUM PHOSPHATE 10 MG/ML IJ SOLN
INTRAMUSCULAR | Status: DC | PRN
  Administered 2020-02-29: 5 mg via INTRAVENOUS

## 2020-02-29 MED ORDER — MIDAZOLAM HCL 2 MG/2ML IJ SOLN
INTRAMUSCULAR | Status: AC
  Filled 2020-02-29: qty 2

## 2020-02-29 MED ORDER — CHLORHEXIDINE GLUCONATE 0.12 % MT SOLN: 15.0000 mL | Freq: Once | OROMUCOSAL | Status: AC

## 2020-02-29 MED ORDER — PROMETHAZINE HCL 25 MG/ML IJ SOLN: 6.2500 mg | INTRAMUSCULAR | Status: DC | PRN

## 2020-02-29 MED ORDER — LIDOCAINE HCL (CARDIAC) PF 100 MG/5ML IV SOSY
PREFILLED_SYRINGE | INTRAVENOUS | Status: DC | PRN
  Administered 2020-02-29: 100 mg via INTRAVENOUS

## 2020-02-29 MED ORDER — LORAZEPAM 2 MG/ML IJ SOLN: 1.0000 mg | Freq: Once | INTRAMUSCULAR | Status: DC | PRN

## 2020-02-29 MED ORDER — MIDAZOLAM HCL 2 MG/2ML IJ SOLN
INTRAMUSCULAR | Status: DC | PRN
  Administered 2020-02-29: 2 mg via INTRAVENOUS

## 2020-02-29 MED ORDER — DEXMEDETOMIDINE HCL 200 MCG/2ML IV SOLN
INTRAVENOUS | Status: DC | PRN
  Administered 2020-02-29: 20 ug via INTRAVENOUS

## 2020-02-29 MED ORDER — LACTATED RINGERS IV SOLN: INTRAVENOUS | Status: DC

## 2020-02-29 MED ORDER — OXYCODONE HCL 10 MG PO TABS
10.0000 mg | ORAL_TABLET | Freq: Four times a day (QID) | ORAL | 0 refills | Status: DC | PRN
Start: 2020-02-29 — End: 2021-03-04

## 2020-02-29 MED ORDER — DROPERIDOL 2.5 MG/ML IJ SOLN
0.6250 mg | Freq: Once | INTRAMUSCULAR | Status: DC | PRN
  Filled 2020-02-29: qty 2

## 2020-02-29 MED ORDER — CEFAZOLIN SODIUM-DEXTROSE 2-4 GM/100ML-% IV SOLN
2.0000 g | INTRAVENOUS | Status: AC
  Administered 2020-02-29: 2 g via INTRAVENOUS

## 2020-02-29 MED ORDER — ONDANSETRON HCL 4 MG/2ML IJ SOLN
INTRAMUSCULAR | Status: DC | PRN
  Administered 2020-02-29: 4 mg via INTRAVENOUS

## 2020-02-29 MED ORDER — ORAL CARE MOUTH RINSE: 15.0000 mL | Freq: Once | OROMUCOSAL | Status: AC

## 2020-02-29 MED ORDER — OXYCODONE HCL 5 MG/5ML PO SOLN: 5.0000 mg | Freq: Once | ORAL | Status: DC | PRN

## 2020-02-29 MED ORDER — HYDROMORPHONE HCL 1 MG/ML IJ SOLN: 0.2500 mg | INTRAMUSCULAR | Status: DC | PRN

## 2020-02-29 MED ORDER — PROPOFOL 10 MG/ML IV BOLUS
INTRAVENOUS | Status: AC
  Filled 2020-02-29: qty 20

## 2020-02-29 MED ORDER — CEFAZOLIN SODIUM-DEXTROSE 2-4 GM/100ML-% IV SOLN
INTRAVENOUS | Status: AC
  Filled 2020-02-29: qty 100

## 2020-02-29 MED ORDER — FENTANYL CITRATE (PF) 100 MCG/2ML IJ SOLN
INTRAMUSCULAR | Status: DC | PRN
  Administered 2020-02-29 (×2): 50 ug via INTRAVENOUS
  Administered 2020-02-29: 100 ug via INTRAVENOUS

## 2020-02-29 MED ORDER — OXYCODONE HCL 5 MG PO TABS: 5.0000 mg | ORAL_TABLET | Freq: Once | ORAL | Status: DC | PRN

## 2020-02-29 SURGICAL SUPPLY — 33 items
APL PRP STRL LF DISP 70% ISPRP (MISCELLANEOUS) ×1
BNDG COHESIVE 4X5 TAN STRL (GAUZE/BANDAGES/DRESSINGS) ×2 IMPLANT
BNDG ELASTIC 2X5.8 VLCR STR LF (GAUZE/BANDAGES/DRESSINGS) ×2 IMPLANT
BNDG ESMARK 4X12 TAN STRL LF (GAUZE/BANDAGES/DRESSINGS) ×2 IMPLANT
CANISTER SUCT 1200ML W/VALVE (MISCELLANEOUS) ×2 IMPLANT
CHLORAPREP W/TINT 26 (MISCELLANEOUS) ×2 IMPLANT
CORD BIP STRL DISP 12FT (MISCELLANEOUS) ×2 IMPLANT
COVER WAND RF STERILE (DRAPES) ×2 IMPLANT
CUFF TOURN SGL QUICK 18X4 (TOURNIQUET CUFF) ×2 IMPLANT
DRAPE SURG 17X11 SM STRL (DRAPES) ×2 IMPLANT
FORCEPS JEWEL BIP 4-3/4 STR (INSTRUMENTS) ×2 IMPLANT
GAUZE SPONGE 4X4 12PLY STRL (GAUZE/BANDAGES/DRESSINGS) ×2 IMPLANT
GAUZE XEROFORM 1X8 LF (GAUZE/BANDAGES/DRESSINGS) ×2 IMPLANT
GLOVE BIO SURGEON STRL SZ8 (GLOVE) ×2 IMPLANT
GLOVE INDICATOR 8.0 STRL GRN (GLOVE) ×2 IMPLANT
GOWN STRL REUS W/ TWL LRG LVL3 (GOWN DISPOSABLE) ×1 IMPLANT
GOWN STRL REUS W/ TWL XL LVL3 (GOWN DISPOSABLE) ×1 IMPLANT
GOWN STRL REUS W/TWL LRG LVL3 (GOWN DISPOSABLE) ×2
GOWN STRL REUS W/TWL XL LVL3 (GOWN DISPOSABLE) ×2
KIT CARPAL TUNNEL (MISCELLANEOUS) ×2
KIT ESCP INSRT D SLOT CANN KN (MISCELLANEOUS) ×1 IMPLANT
KIT TURNOVER KIT A (KITS) ×2 IMPLANT
MANIFOLD NEPTUNE II (INSTRUMENTS) ×2 IMPLANT
NS IRRIG 500ML POUR BTL (IV SOLUTION) ×2 IMPLANT
PACK EXTREMITY ARMC (MISCELLANEOUS) ×2 IMPLANT
SPLINT WRIST LG LT TX990309 (SOFTGOODS) IMPLANT
SPLINT WRIST LG RT TX900304 (SOFTGOODS) IMPLANT
SPLINT WRIST M LT TX990308 (SOFTGOODS) IMPLANT
SPLINT WRIST M RT TX990303 (SOFTGOODS) IMPLANT
SPLINT WRIST XL LT TX990310 (SOFTGOODS) ×2 IMPLANT
SPLINT WRIST XL RT TX990305 (SOFTGOODS) IMPLANT
STOCKINETTE IMPERVIOUS 9X36 MD (GAUZE/BANDAGES/DRESSINGS) ×2 IMPLANT
SUT PROLENE 4 0 PS 2 18 (SUTURE) ×2 IMPLANT

## 2020-02-29 NOTE — Transfer of Care (Signed)
Immediate Anesthesia Transfer of Care Note  Patient: Gary Schultz  Procedure(s) Performed: CARPAL TUNNEL RELEASE ENDOSCOPIC (Left Wrist)  Patient Location: PACU  Anesthesia Type:General  Level of Consciousness: awake  Airway & Oxygen Therapy: Patient Spontanous Breathing and Patient connected to face mask oxygen  Post-op Assessment: Report given to RN and Post -op Vital signs reviewed and stable  Post vital signs: Reviewed and stable  Last Vitals:  Vitals Value Taken Time  BP    Temp    Pulse    Resp    SpO2      Last Pain:  Vitals:   02/29/20 1122  TempSrc: Tympanic  PainSc: 6          Complications: No complications documented.

## 2020-02-29 NOTE — Anesthesia Preprocedure Evaluation (Signed)
Anesthesia Evaluation  Patient identified by MRN, date of birth, ID band Patient awake    Reviewed: Allergy & Precautions, H&P , NPO status , Patient's Chart, lab work & pertinent test results  Airway Mallampati: II       Dental no notable dental hx. (+) Teeth Intact   Pulmonary neg pulmonary ROS,    Pulmonary exam normal breath sounds clear to auscultation       Cardiovascular hypertension, negative cardio ROS Normal cardiovascular exam Rhythm:Regular Rate:Normal     Neuro/Psych  Headaches, PSYCHIATRIC DISORDERS Anxiety  Neuromuscular disease    GI/Hepatic Neg liver ROS, GERD  ,  Endo/Other  negative endocrine ROS  Renal/GU negative Renal ROS  negative genitourinary   Musculoskeletal  (+) Arthritis ,   Abdominal   Peds negative pediatric ROS (+)  Hematology negative hematology ROS (+)   Anesthesia Other Findings Past Medical History: No date: Anxiety No date: Carpal tunnel syndrome     Comment:  bilateral No date: DDD (degenerative disc disease), lumbar No date: Generalized headaches     Comment:  from neck injury No date: GERD (gastroesophageal reflux disease) No date: Hypertension No date: Neck pain No date: Neuromuscular disorder (HCC) No date: Varicose veins   Reproductive/Obstetrics negative OB ROS                             Anesthesia Physical Anesthesia Plan  ASA: II  Anesthesia Plan: General   Post-op Pain Management:    Induction: Intravenous  PONV Risk Score and Plan: 2 and Ondansetron and Dexamethasone  Airway Management Planned: LMA  Additional Equipment:   Intra-op Plan:   Post-operative Plan: Extubation in OR  Informed Consent: I have reviewed the patients History and Physical, chart, labs and discussed the procedure including the risks, benefits and alternatives for the proposed anesthesia with the patient or authorized representative who has  indicated his/her understanding and acceptance.       Plan Discussed with: CRNA, Anesthesiologist and Surgeon  Anesthesia Plan Comments:         Anesthesia Quick Evaluation

## 2020-02-29 NOTE — H&P (Signed)
HPI:  Gary Schultz is a 55 y.o. male who presents for evaluation and treatment of progressively worsening left wrist and hand pain/paresthesias. The patient has a history of left carpal tunnel syndrome. He notes that the symptoms are worse with any repetitive activities, and occasionally will awaken him from sleep at night. He would like to proceed with an endoscopic left carpal tunnel release.  Current Outpatient Medications: . amLODIPine (NORVASC) 5 MG tablet Take 5 mg by mouth once daily  . diclofenac (VOLTAREN) 1 % topical gel Apply 2 g topically 4 (four) times daily  . DULoxetine (CYMBALTA) 30 MG DR capsule Take 1 capsule (30 mg total) by mouth once daily 30 capsule 2  . gabapentin (NEURONTIN) 300 MG capsule Take 900 mg by mouth 3 (three) times daily as needed  . lisinopriL (ZESTRIL) 30 MG tablet Take 30 mg by mouth once daily  . omeprazole (PRILOSEC) 20 MG DR capsule TAKE 1 CAPSULE BY MOUTH ONCE DAILY BEFORE BREAKFAST (Patient not taking: Reported on 01/10/2020 )  . oxyCODONE (DAZIDOX) 20 mg immediate release tablet Take by mouth Take 20 mg by mouth 5 (five) times daily as needed (pain).  Marland Kitchen oxyCODONE (ROXICODONE) 15 MG immediate release tablet Take 1 tablet (15 mg total) by mouth every 4 (four) hours as needed 15 tablet 0  . tizanidine HCl (ZANAFLEX ORAL) Take 1 tablet by mouth as needed   Allergies:  . Tylenol [Acetaminophen] Rash   Past Medical History:  . Chickenpox  . DDD (degenerative disc disease), lumbar  . Hypertension   Past Surgical History:  Procedure Laterality Date  . Arthroscopic partial medial meniscectomy with abrasion chondroplasty of grade 2-3 chondromalacial changes of patella, left knee Left 09/28/2018 Dr. Joice Lofts  . Attempted arthroscopic medial meniscus repair, arthroscopic partial medial meniscectomy, and arthroscopic abrasion chondroplasty, right knee. Right 12/07/2018 Dr.Khylin Gutridge  . Debridement of chronic peroneus brevis tendinopathy with reconstruction of  calcaneofibular and anterior talofibular ligaments, left ankle. Left 03/29/2019 Dr.Danuta Huseman  . Endoscopic right carpal tunnel release. Right 12/22/2019 Dr. Joice Lofts  . L4-5 discectomy 11/2008  . Right Shoulder Surgery   Family History:  . High blood pressure (Hypertension) Mother  . High blood pressure (Hypertension) Father   Social History:   Socioeconomic History:  Marland Kitchen Marital status: Married  Spouse name: Not on file  . Number of children: Not on file  . Years of education: Not on file  . Highest education level: Not on file  Occupational History  . Not on file  Tobacco Use  . Smoking status: Never Smoker  . Smokeless tobacco: Never Used  Vaping Use  . Vaping Use: Never used  Substance and Sexual Activity  . Alcohol use: Yes  Comment: occasionally  . Drug use: Never  . Sexual activity: Not on file  Other Topics Concern  . Not on file  Social History Narrative  . Not on file   Social Determinants of Health:   Financial Resource Strain: Not on file  Food Insecurity: Not on file  Transportation Needs: Not on file   Review of Systems:  A comprehensive 14 point ROS was performed, reviewed, and the pertinent orthopaedic findings are documented in the HPI.  Physical Exam: Vitals:  02/06/20 1059  BP: 140/74  Weight: 97.1 kg (214 lb)  Height: 185.4 cm (6\' 1" )  PainSc: 0-No pain  PainLoc: Hand   General/Constitutional: The patient appears to be well-nourished, well-developed, and in no acute distress. Neuro/Psych: Normal mood and affect, oriented to person, place and time.  Eyes: Non-icteric. Pupils are equal, round, and reactive to light, and exhibit synchronous movement. ENT: Unremarkable. Lymphatic: No palpable adenopathy. Respiratory: Lungs clear to auscultation, Normal chest excursion, No wheezes and Non-labored breathing Cardiovascular: Regular rate and rhythm. No murmurs. and No edema, swelling or tenderness, except as noted in detailed exam. Integumentary: No  impressive skin lesions present, except as noted in detailed exam. Musculoskeletal: Unremarkable, except as noted in detailed exam.  Left hand/wrist exam: Skin inspection of the left hand and wrist remains unremarkable. No swelling, erythema, ecchymosis, abrasions, or other skin abnormalities are identified. There is no tenderness to palpation over the volar aspect of his wrist. He demonstrates full active and passive range of motion of the wrist without pain. He is able to actively flex and extend all digits fully without any pain or triggering. He is neurovascularly intact to all digits. He exhibits a positive Phalen's test as well as a positive Tinel's over the carpal tunnel.  Assessment: Carpal tunnel syndrome of left wrist   Plan: The treatment options were discussed with the patient. In addition, patient educational materials were provided regarding the diagnosis and treatment options. Regarding his left wrist symptoms, the patient is frustrated by his persistent symptoms and functional limitations, and is ready to consider more aggressive treatment options. Therefore, I have recommended a surgical procedure, specifically an endoscopic left carpal tunnel release. The procedure was discussed with the patient, as were the potential risks (including bleeding, infection, nerve and/or blood vessel injury, persistent or recurrent pain/paresthesias, weakness of grip, need for further surgery, blood clots, strokes, heart attacks and/or arhythmias, pneumonia, etc.) and benefits. The patient states his understanding and wishes to proceed. All of the patient's questions and concerns were answered. He can call any time with further concerns. He will follow up post-surgery, routine.   H&P reviewed and patient re-examined. No changes.

## 2020-02-29 NOTE — Discharge Instructions (Addendum)
Orthopedic discharge instructions: Keep dressing dry and intact. Keep hand elevated above heart level. May shower after dressing removed on postop day 4 (Sunday). Cover sutures with Band-Aids after drying off. Apply ice to affected area frequently. Take ibuprofen 600-800 mg TID with meals for 7-10 days, then as necessary. Take ES Tylenol or pain medication as prescribed when needed.  Return for follow-up in 10-14 days or as scheduled.   AMBULATORY SURGERY  DISCHARGE INSTRUCTIONS   1) The drugs that you were given will stay in your system until tomorrow so for the next 24 hours you should not:  A) Drive an automobile B) Make any legal decisions C) Drink any alcoholic beverage   2) You may resume regular meals tomorrow.  Today it is better to start with liquids and gradually work up to solid foods.  You may eat anything you prefer, but it is better to start with liquids, then soup and crackers, and gradually work up to solid foods.   3) Please notify your doctor immediately if you have any unusual bleeding, trouble breathing, redness and pain at the surgery site, drainage, fever, or pain not relieved by medication.  4) Your post-operative visit with Dr.                                     is: Date:                        Time:    Please call to schedule your post-operative visit.  5) Additional Instructions:  

## 2020-02-29 NOTE — Anesthesia Postprocedure Evaluation (Signed)
Anesthesia Post Note  Patient: Gary Schultz  Procedure(s) Performed: CARPAL TUNNEL RELEASE ENDOSCOPIC (Left Wrist)  Patient location during evaluation: PACU Anesthesia Type: General Level of consciousness: awake Pain management: pain level controlled Vital Signs Assessment: post-procedure vital signs reviewed and stable Respiratory status: spontaneous breathing Cardiovascular status: stable Postop Assessment: no apparent nausea or vomiting Anesthetic complications: no   No complications documented.   Last Vitals:  Vitals:   02/29/20 1442 02/29/20 1446  BP:  129/87  Pulse: (!) 58 62  Resp: 18 15  Temp:    SpO2: 99% 100%    Last Pain:  Vitals:   02/29/20 1442  TempSrc:   PainSc: 0-No pain                 Emilio Math

## 2020-02-29 NOTE — Op Note (Signed)
02/29/2020  1:37 PM  Patient:   Gary Schultz  Pre-Op Diagnosis:   Left carpal tunnel syndrome.  Post-Op Diagnosis:   Same.  Procedure:   Endoscopic left carpal tunnel release.  Surgeon:   Maryagnes Amos, MD  Anesthesia:   General LMA  Findings:   As above.  Complications:   None  EBL:   1 cc  Fluids:   600 cc crystalloid  TT:   14 minutes at 250 mmHg  Drains:   None  Closure:   4-0 Prolene interrupted sutures  Brief Clinical Note:   The patient is a 55 year old male with a long history of progressively worsening pain and paresthesias to his left hand. His symptoms have progressed despite medications, activity modification, etc. His history and examination consistent with carpal tunnel syndrome, confirmed by EMG. The patient presents at this time for an endoscopic left carpal tunnel release.   Procedure:   The patient was brought into the operating room and lain in the supine position. After adequate general laryngeal mask anesthesia was obtained, the left hand and upper extremity were prepped with ChloraPrep solution before being draped sterilely. Preoperative antibiotics were administered. A timeout was performed to verify the appropriate surgical site before the limb was exsanguinated with an Esmarch and the tourniquet inflated to 250 mmHg. An approximately 1.5-2 cm incision was made over the volar wrist flexion crease, centered over the palmaris longus tendon. The incision was carried down through the subcutaneous tissues with care taken to identify and protect any neurovascular structures. The distal forearm fascia was penetrated just proximal to the transverse carpal ligament. The soft tissues were released off the superficial and deep surfaces of the distal forearm fascia and this was released proximally for 3-4 cm under direct visualization.  Attention was directed distally. The Therapist, nutritional was passed beneath the transverse carpal ligament along the ulnar aspect of  the carpal tunnel and used to release any adhesions as well as to remove any adherent synovial tissue before first the smaller then the larger of the two dilators were passed beneath the transverse carpal ligament along the ulnar margin of the carpal tunnel. The slotted cannula was introduced and the endoscope was placed into the slotted cannula and the undersurface of the transverse carpal ligament visualized. The distal margin of the transverse carpal ligament was marked by placing a 25-gauge needle percutaneously at Kaplan's cardinal point so that it entered the distal portion of the slotted cannula. Under endoscopic visualization, the transverse carpal ligament was released from proximal to distal using the end-cutting blade. A second pass was performed to ensure complete release of the ligament. The adequacy of release was verified both endoscopically and by palpation using the freer elevator.  The wound was irrigated thoroughly with sterile saline solution before being closed using 4-0 Prolene interrupted sutures. A total of 10 cc of 0.5% plain Sensorcaine was injected in and around the incision before a sterile bulky dressing was applied to the wound. The patient was placed into a volar wrist splint before being awakened and returned to the recovery room in satisfactory condition after tolerating the procedure well.

## 2020-03-01 ENCOUNTER — Encounter: Payer: Self-pay | Admitting: Surgery

## 2020-03-08 ENCOUNTER — Other Ambulatory Visit: Payer: Self-pay | Admitting: Family Medicine

## 2020-03-08 DIAGNOSIS — R142 Eructation: Secondary | ICD-10-CM

## 2020-03-08 DIAGNOSIS — I10 Essential (primary) hypertension: Secondary | ICD-10-CM

## 2020-03-08 NOTE — Telephone Encounter (Signed)
Attempted to scheduled appt. No answer, left message on voicemail to call clinic back to schedule appt . B/P recheck.

## 2020-03-09 ENCOUNTER — Encounter: Payer: Self-pay | Admitting: Family Medicine

## 2020-03-09 ENCOUNTER — Ambulatory Visit (INDEPENDENT_AMBULATORY_CARE_PROVIDER_SITE_OTHER): Payer: Self-pay | Admitting: Family Medicine

## 2020-03-09 ENCOUNTER — Other Ambulatory Visit: Payer: Self-pay

## 2020-03-09 VITALS — BP 149/91 | HR 75 | Ht 73.0 in | Wt 208.0 lb

## 2020-03-09 DIAGNOSIS — R41840 Attention and concentration deficit: Secondary | ICD-10-CM

## 2020-03-09 DIAGNOSIS — F331 Major depressive disorder, recurrent, moderate: Secondary | ICD-10-CM

## 2020-03-09 DIAGNOSIS — F3341 Major depressive disorder, recurrent, in partial remission: Secondary | ICD-10-CM | POA: Insufficient documentation

## 2020-03-09 DIAGNOSIS — I10 Essential (primary) hypertension: Secondary | ICD-10-CM

## 2020-03-09 DIAGNOSIS — F0781 Postconcussional syndrome: Secondary | ICD-10-CM

## 2020-03-09 DIAGNOSIS — F411 Generalized anxiety disorder: Secondary | ICD-10-CM

## 2020-03-09 DIAGNOSIS — F3340 Major depressive disorder, recurrent, in remission, unspecified: Secondary | ICD-10-CM | POA: Insufficient documentation

## 2020-03-09 MED ORDER — AMLODIPINE BESYLATE 10 MG PO TABS: 10.0000 mg | ORAL_TABLET | Freq: Every day | ORAL | 1 refills | Status: DC

## 2020-03-09 MED ORDER — LISINOPRIL 30 MG PO TABS: 30.0000 mg | ORAL_TABLET | Freq: Every day | ORAL | 1 refills | Status: DC

## 2020-03-09 NOTE — Patient Instructions (Addendum)
Thank you for coming to the office today.  Increasing Amlodipine from 5 up to 10mg  daily, keep track of BP. Goal is < 140/90. If side effect, lightheaded or worsening dizzy or not feeling well, or BP < 100 let me know  Referral to Mental Health specialist  [facility] Address: [address] [place], [place] [email or web address] Phone: [phone]  Stay tuned for apt.  Please schedule a Follow-up Appointment to: Return in about 3 months (around 06/07/2020) for 3 month HTN, mood.  If you have any other questions or concerns, please feel free to call the office or send a message through MyChart. You may also schedule an earlier appointment if necessary.  Additionally, you may be receiving a survey about your experience at our office within a few days to 1 week by e-mail or mail. We value your feedback.  06/09/2020, DO [facility], [facility]
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
## 2020-03-12 ENCOUNTER — Telehealth: Payer: Self-pay | Admitting: Family Medicine

## 2020-03-12 NOTE — Telephone Encounter (Signed)
Pt decline to take covid vaccine

## 2020-05-09 ENCOUNTER — Telehealth: Payer: Self-pay

## 2020-05-09 DIAGNOSIS — I10 Essential (primary) hypertension: Secondary | ICD-10-CM

## 2020-05-09 DIAGNOSIS — Z0000 Encounter for general adult medical examination without abnormal findings: Secondary | ICD-10-CM

## 2020-05-09 DIAGNOSIS — E7800 Pure hypercholesterolemia, unspecified: Secondary | ICD-10-CM

## 2020-05-09 NOTE — Telephone Encounter (Signed)
The pt was scheduled an annual physical and labs prior.

## 2020-05-09 NOTE — Telephone Encounter (Signed)
Copied from CRM [id number]. Topic: Referral - Status >> May 09, 2020 10:45 AM Pawlus, Maxine Glenn A wrote: Reason for CRM: Pt wanted more information about having a colonoscopy done, stated he is now over 52 and thinks it is time. Pt was unsure if he needed a referral for this, please call back and advise.

## 2020-05-11 ENCOUNTER — Other Ambulatory Visit: Payer: Self-pay | Admitting: *Deleted

## 2020-05-11 DIAGNOSIS — Z0000 Encounter for general adult medical examination without abnormal findings: Secondary | ICD-10-CM

## 2020-05-11 DIAGNOSIS — E7800 Pure hypercholesterolemia, unspecified: Secondary | ICD-10-CM

## 2020-05-11 DIAGNOSIS — I10 Essential (primary) hypertension: Secondary | ICD-10-CM

## 2020-05-14 ENCOUNTER — Other Ambulatory Visit: Payer: Self-pay

## 2020-05-15 LAB — CBC WITH DIFFERENTIAL/PLATELET
Absolute Monocytes: 673 cells/uL (ref 200–950)
Basophils Absolute: 29 cells/uL (ref 0–200)
Basophils Relative: 0.5 %
Eosinophils Absolute: 93 cells/uL (ref 15–500)
Eosinophils Relative: 1.6 %
HCT: 42.1 % (ref 38.5–50.0)
Hemoglobin: 14.2 g/dL (ref 13.2–17.1)
Lymphs Abs: 1560 cells/uL (ref 850–3900)
MCH: 30.1 pg (ref 27.0–33.0)
MCHC: 33.7 g/dL (ref 32.0–36.0)
MCV: 89.2 fL (ref 80.0–100.0)
MPV: 11.2 fL (ref 7.5–12.5)
Monocytes Relative: 11.6 %
Neutro Abs: 3445 cells/uL (ref 1500–7800)
Neutrophils Relative %: 59.4 %
Platelets: 166 10*3/uL (ref 140–400)
RBC: 4.72 10*6/uL (ref 4.20–5.80)
RDW: 12.3 % (ref 11.0–15.0)
Total Lymphocyte: 26.9 %
WBC: 5.8 10*3/uL (ref 3.8–10.8)

## 2020-05-15 LAB — LIPID PANEL
Cholesterol: 193 mg/dL (ref <200)
HDL: 61 mg/dL (ref >=40)
LDL Cholesterol (Calc): 115 mg/dL (calc) — ABNORMAL HIGH
Non-HDL Cholesterol (Calc): 132 mg/dL (calc) — ABNORMAL HIGH (ref <130)
Total CHOL/HDL Ratio: 3.2 (calc) (ref <5.0)
Triglycerides: 73 mg/dL (ref <150)

## 2020-05-15 LAB — COMPREHENSIVE METABOLIC PANEL
AG Ratio: 1.9 (calc) (ref 1.0–2.5)
ALT: 18 U/L (ref 9–46)
AST: 15 U/L (ref 10–35)
Albumin: 4.2 g/dL (ref 3.6–5.1)
Alkaline phosphatase (APISO): 79 U/L (ref 35–144)
BUN: 15 mg/dL (ref 7–25)
CO2: 26 mmol/L (ref 20–32)
Calcium: 9.3 mg/dL (ref 8.6–10.3)
Chloride: 103 mmol/L (ref 98–110)
Creat: 0.86 mg/dL (ref 0.70–1.33)
Globulin: 2.2 g/dL (calc) (ref 1.9–3.7)
Glucose, Bld: 95 mg/dL (ref 65–99)
Potassium: 4.2 mmol/L (ref 3.5–5.3)
Sodium: 138 mmol/L (ref 135–146)
Total Bilirubin: 0.8 mg/dL (ref 0.2–1.2)
Total Protein: 6.4 g/dL (ref 6.1–8.1)

## 2020-05-15 LAB — HEMOGLOBIN A1C
Hgb A1c MFr Bld: 5.2 % of total Hgb (ref <5.7)
Mean Plasma Glucose: 103 mg/dL
eAG (mmol/L): 5.7 mmol/L

## 2020-05-15 LAB — PSA: PSA: 0.45 ng/mL (ref <=4.0)

## 2020-05-16 ENCOUNTER — Other Ambulatory Visit: Payer: Self-pay

## 2020-05-16 ENCOUNTER — Encounter: Payer: Self-pay | Admitting: Family Medicine

## 2020-05-16 ENCOUNTER — Ambulatory Visit (INDEPENDENT_AMBULATORY_CARE_PROVIDER_SITE_OTHER): Payer: 59 | Admitting: Family Medicine

## 2020-05-16 VITALS — BP 130/80 | HR 79 | Ht 73.0 in | Wt 208.4 lb

## 2020-05-16 DIAGNOSIS — N401 Enlarged prostate with lower urinary tract symptoms: Secondary | ICD-10-CM

## 2020-05-16 DIAGNOSIS — F3340 Major depressive disorder, recurrent, in remission, unspecified: Secondary | ICD-10-CM

## 2020-05-16 DIAGNOSIS — Z1211 Encounter for screening for malignant neoplasm of colon: Secondary | ICD-10-CM

## 2020-05-16 DIAGNOSIS — M1811 Unilateral primary osteoarthritis of first carpometacarpal joint, right hand: Secondary | ICD-10-CM

## 2020-05-16 DIAGNOSIS — I10 Essential (primary) hypertension: Secondary | ICD-10-CM | POA: Diagnosis not present

## 2020-05-16 DIAGNOSIS — R351 Nocturia: Secondary | ICD-10-CM

## 2020-05-16 DIAGNOSIS — E7800 Pure hypercholesterolemia, unspecified: Secondary | ICD-10-CM

## 2020-05-16 DIAGNOSIS — Z0000 Encounter for general adult medical examination without abnormal findings: Secondary | ICD-10-CM | POA: Diagnosis not present

## 2020-05-16 HISTORY — DX: Benign prostatic hyperplasia with lower urinary tract symptoms: N40.1

## 2020-05-16 NOTE — Assessment & Plan Note (Signed)
Chronic problem R thumb CMC joint May refer to ortho when patient ready

## 2020-05-16 NOTE — Assessment & Plan Note (Signed)
Improved mood, now no longer working for a problematic boss He will look for new job w/ unemployment  Mood is improved See scores  Follow-up PRN
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
## 2020-05-16 NOTE — Assessment & Plan Note (Signed)
Stable chronic BPH new diagnosis evidence of obstruction. Mostly Nocturia symptoms Some inc fluids Controlled on Saw Palmetto - Last PSA 0.4 (04/2020) - No known personal/family history of prostate CA  Plan: Follow -up PRN

## 2020-05-16 NOTE — Assessment & Plan Note (Signed)
Well-controlled HTN  No known complications    Plan:  1. Continue current BP regimen - Amlodipine 10mg  daily, Lisinopril 30mg  daily 2. Encourage improved lifestyle - low sodium diet, regular exercise 3. Continue monitor BP outside office, bring readings to next visit, if persistently >140/90 or new symptoms notify office sooner

## 2020-05-16 NOTE — Patient Instructions (Addendum)
Thank you for coming to the office today.  Call our office when ready to pursue Orthopedic Referral for your Right Thumb issue. Likely arthritis injury and chronic pain. [facility] - check with them if Dr Joice Lofts can handle the thumb or if he recommends someone else.  Call them up and find out who we should refer you to  ------  Stay tuned for colonoscopy apt  [facility]) [address] - [address] [place], [place] [address] Phone: [phone]  Please schedule a Follow-up Appointment to: Return in about 6 months (around 11/16/2020) for 6 month follow-up HTN, arthritis.  If you have any other questions or concerns, please feel free to call the office or send a message through MyChart. You may also schedule an earlier appointment if necessary.  Additionally, you may be receiving a survey about your experience at our office within a few days to 1 week by e-mail or mail. We value your feedback.  Saralyn Pilar, DO [facility], [place]

## 2020-05-25 ENCOUNTER — Other Ambulatory Visit: Payer: Self-pay

## 2020-05-25 DIAGNOSIS — Z1211 Encounter for screening for malignant neoplasm of colon: Secondary | ICD-10-CM

## 2020-05-25 MED ORDER — SUPREP BOWEL PREP KIT 17.5-3.13-1.6 GM/177ML PO SOLN: 1.0000 | ORAL | 0 refills | Status: DC

## 2020-05-29 ENCOUNTER — Other Ambulatory Visit: Payer: Self-pay

## 2020-05-29 ENCOUNTER — Encounter: Payer: Self-pay | Admitting: Gastroenterology

## 2020-06-01 NOTE — Discharge Instructions (Signed)

## 2020-06-08 ENCOUNTER — Other Ambulatory Visit: Payer: Self-pay

## 2020-06-08 ENCOUNTER — Encounter: Payer: Self-pay | Admitting: Gastroenterology

## 2020-06-08 ENCOUNTER — Ambulatory Visit
Admission: RE | Admit: 2020-06-08 | Discharge: 2020-06-08 | Disposition: A | Discharge status: Home / Self Care | Payer: 59 | Source: Ambulatory Visit | Attending: Gastroenterology | Admitting: Gastroenterology

## 2020-06-08 ENCOUNTER — Ambulatory Visit: Payer: 59 | Admitting: Anesthesiology

## 2020-06-08 ENCOUNTER — Ambulatory Visit
Admission: RE | Disposition: A | Discharge status: Home / Self Care | Payer: Self-pay | Source: Ambulatory Visit | Attending: Gastroenterology

## 2020-06-08 DIAGNOSIS — Z886 Allergy status to analgesic agent status: Secondary | ICD-10-CM | POA: Insufficient documentation

## 2020-06-08 DIAGNOSIS — K640 First degree hemorrhoids: Secondary | ICD-10-CM | POA: Insufficient documentation

## 2020-06-08 DIAGNOSIS — Z1211 Encounter for screening for malignant neoplasm of colon: Secondary | ICD-10-CM

## 2020-06-08 DIAGNOSIS — Z79899 Other long term (current) drug therapy: Secondary | ICD-10-CM | POA: Diagnosis not present

## 2020-06-08 HISTORY — PX: COLONOSCOPY WITH PROPOFOL: SHX5780

## 2020-06-08 SURGERY — COLONOSCOPY WITH PROPOFOL
Anesthesia: General | Site: Rectum

## 2020-06-08 MED ORDER — SODIUM CHLORIDE 0.9 % IV SOLN: INTRAVENOUS | Status: DC

## 2020-06-08 MED ORDER — ACETAMINOPHEN 160 MG/5ML PO SOLN: 325.0000 mg | Freq: Once | ORAL | Status: DC

## 2020-06-08 MED ORDER — LIDOCAINE HCL (CARDIAC) PF 100 MG/5ML IV SOSY
PREFILLED_SYRINGE | INTRAVENOUS | Status: DC | PRN
  Administered 2020-06-08: 40 mg via INTRAVENOUS

## 2020-06-08 MED ORDER — ACETAMINOPHEN 325 MG PO TABS: 325.0000 mg | ORAL_TABLET | Freq: Once | ORAL | Status: DC

## 2020-06-08 MED ORDER — STERILE WATER FOR IRRIGATION IR SOLN
Status: DC | PRN
  Administered 2020-06-08: .05 mL

## 2020-06-08 MED ORDER — LACTATED RINGERS IV SOLN: INTRAVENOUS | Status: DC

## 2020-06-08 MED ORDER — PROPOFOL 10 MG/ML IV BOLUS
INTRAVENOUS | Status: DC | PRN
  Administered 2020-06-08: 70 mg via INTRAVENOUS
  Administered 2020-06-08 (×2): 50 mg via INTRAVENOUS

## 2020-06-08 SURGICAL SUPPLY — 6 items
GOWN CVR UNV OPN BCK APRN NK (MISCELLANEOUS) ×2 IMPLANT
GOWN ISOL THUMB LOOP REG UNIV (MISCELLANEOUS) ×4
KIT PRC NS LF DISP ENDO (KITS) ×1 IMPLANT
KIT PROCEDURE OLYMPUS (KITS) ×2
MANIFOLD NEPTUNE II (INSTRUMENTS) ×2 IMPLANT
WATER STERILE IRR 250ML POUR (IV SOLUTION) ×2 IMPLANT

## 2020-06-08 NOTE — Transfer of Care (Signed)
Immediate Anesthesia Transfer of Care Note  Patient: Gary Schultz  Procedure(s) Performed: COLONOSCOPY WITH PROPOFOL (N/A Rectum)  Patient Location: PACU  Anesthesia Type: General  Level of Consciousness: awake, alert  and patient cooperative  Airway and Oxygen Therapy: Patient Spontanous Breathing and Patient connected to supplemental oxygen  Post-op Assessment: Post-op Vital signs reviewed, Patient's Cardiovascular Status Stable, Respiratory Function Stable, Patent Airway and No signs of Nausea or vomiting  Post-op Vital Signs: Reviewed and stable  Complications: No complications documented.

## 2020-06-08 NOTE — Anesthesia Procedure Notes (Signed)
Procedure Name: MAC Date/Time: 06/08/2020 9:53 AM Performed by: Silvana Newness, CRNA Pre-anesthesia Checklist: Patient identified, Emergency Drugs available, Suction available, Patient being monitored and Timeout performed Patient Re-evaluated:Patient Re-evaluated prior to induction Oxygen Delivery Method: Nasal cannula Placement Confirmation: positive ETCO2

## 2020-06-08 NOTE — Anesthesia Postprocedure Evaluation (Signed)
Anesthesia Post Note  Patient: Gary Schultz  Procedure(s) Performed: COLONOSCOPY WITH PROPOFOL (N/A Rectum)     Patient location during evaluation: PACU Anesthesia Type: General Level of consciousness: awake and alert and oriented Pain management: satisfactory to patient Vital Signs Assessment: post-procedure vital signs reviewed and stable Respiratory status: spontaneous breathing, nonlabored ventilation and respiratory function stable Cardiovascular status: blood pressure returned to baseline and stable Postop Assessment: Adequate PO intake and No signs of nausea or vomiting Anesthetic complications: no   No complications documented.  Cherly Beach

## 2020-06-08 NOTE — H&P (Signed)
Gary Lame, MD [facility] [address] [place], [place] [address] Phone: [phone] Fax : [phone]  Primary Care Physician:  Olin Hauser, DO Primary Gastroenterologist:  Dr. Allen Norris  Pre-Procedure History & Physical: HPI:  Gary Schultz is a 55 y.o. male is here for a screening colonoscopy.   Past Medical History:  Diagnosis Date  . Anxiety   . Carpal tunnel syndrome    bilateral  . DDD (degenerative disc disease), lumbar   . Generalized headaches    from neck injury  . GERD (gastroesophageal reflux disease)   . Hypertension   . Neck pain   . Neuromuscular disorder ([place])   . Varicose veins     Past Surgical History:  Procedure Laterality Date  . BACK SURGERY  2006   L4-L5 herniated disc repair  . CARPAL TUNNEL RELEASE Right 12/22/2019   Procedure: CARPAL TUNNEL RELEASE ENDOSCOPIC;  Surgeon: Corky Mull, MD;  Location: [facility] ORS;  Service: Orthopedics;  Laterality: Right;  . CARPAL TUNNEL RELEASE Left 02/29/2020   Procedure: CARPAL TUNNEL RELEASE ENDOSCOPIC;  Surgeon: Corky Mull, MD;  Location: [facility] OR[facility];  Service: Orthopedics;  Laterality: Left;  . CLAVICLE SURGERY Right   . FRACTURE SURGERY    . KNEE ARTHROSCOPY WITH MEDIAL MENISECTOMY Left 09/28/2018   Procedure: KNEE ARTHROSCOPY WITH DEBRIDEMENT, abrasion chondroplasty of patella, PARTIAL MEDIAL MENISCECTOMY;  Surgeon: Corky Mull, MD;  Location: [facility] OR[facility];  Service: Orthopedics;  Laterality: Left;  . KNEE ARTHROSCOPY WITH MEDIAL MENISECTOMY Right 12/07/2018   Procedure: RIGHT KNEE ARTHROSCOPY WITH DEBRIDEMENT, REPAIR, PARTIAL MEDIAL MENISECTOMY;  Surgeon: Corky Mull, MD;  Location: [facility] OR[facility];  Service: Orthopedics;  Laterality: Right;  . REPAIR OF PERONEUS BREVIS TENDON Left 03/29/2019   Procedure: Debridement of chronic peroneus brevis tendinopathy with reconstruction of calcaneofibular and anterior talofibular ligaments;  Surgeon: Corky Mull, MD;  Location: [facility] ORS;  Service:  Orthopedics;  Laterality: Left;  . SHOULDER SURGERY Right 1992    Prior to Admission medications   Medication Sig Start Date End Date Taking? Authorizing Provider  amLODipine (NORVASC) 10 MG tablet Take 1 tablet (10 mg total) by mouth daily. 03/09/20  Yes Karamalegos, Devonne Doughty, DO  Ascorbic Acid (VITAMIN C PO) Take 1 tablet by mouth daily.   Yes [provider]  fluticasone (FLONASE) 50 MCG/ACT nasal spray Place into both nostrils daily as needed for allergies or rhinitis.   Yes [provider]  gabapentin (NEURONTIN) 300 MG capsule Take 300-900 mg by mouth 3 (three) times daily as needed (pain).   Yes [provider]  GINSENG PO Take 2 capsules by mouth daily as needed (energy).    Yes [provider]  lidocaine (LIDODERM) 5 % Place 1 patch onto the skin daily. 02/06/20  Yes [provider]  lisinopril (ZESTRIL) 30 MG tablet Take 1 tablet (30 mg total) by mouth daily. 03/09/20  Yes Karamalegos, Devonne Doughty, DO  loratadine (CLARITIN) 10 MG tablet Take 10 mg by mouth daily as needed for allergies.   Yes [provider]  Misc Natural Products (PROSTATE HEALTH) CAPS Take 1 capsule by mouth daily.   Yes [provider]  omeprazole (PRILOSEC) 20 MG capsule TAKE 1 CAPSULE BY MOUTH ONCE DAILY BEFORE BREAKFAST 03/08/20  Yes Karamalegos, Devonne Doughty, DO  Oxycodone HCl 10 MG TABS Take 1 tablet (10 mg total) by mouth 4 (four) times daily as needed (Breakthrough pain). 02/29/20  Yes Poggi, Marshall Cork, MD  TURMERIC PO Take 1  capsule by mouth daily.   Yes [provider]  VITAMIN D PO Take 1 tablet by mouth daily.   Yes [provider]  diclofenac Sodium (VOLTAREN) 1 % GEL Apply 1 application topically 4 (four) times daily as needed (pain).    [provider]  Misc Natural Products (HORNY GOAT WEED PO) Take 1 Dose by mouth daily in the afternoon. Patient not taking: Reported on 05/29/2020    [provider]  Na  Sulfate-K Sulfate-Mg Sulf (SUPREP BOWEL PREP KIT) 17.5-3.13-1.6 GM/177ML SOLN Take 1 kit by mouth as directed. 05/25/20   Gary Lame, MD  Oxycodone HCl 20 MG TABS Take 20 mg by mouth 5 (five) times daily as needed (pain).    [provider]    Allergies as of 05/25/2020 - Review Complete 05/16/2020  Allergen Reaction Noted  . Tylenol [acetaminophen] Hives, Itching, and Rash 06/08/2014    Family History  Problem Relation Age of Onset  . Cancer Mother   . Varicose Veins Mother   . Cancer Father   . Stroke Father   . Hypertension Father   . Colon cancer Father     Social History   Socioeconomic History  . Marital status: Married    Spouse name: [name]  . Number of children: Not on file  . Years of education: Not on file  . Highest education level: Not on file  Occupational History  . Not on file  Tobacco Use  . Smoking status: Never Smoker  . Smokeless tobacco: Never Used  Vaping Use  . Vaping Use: Never used  Substance and Sexual Activity  . Alcohol use: Yes    Alcohol/week: 3.0 standard drinks    Types: 3 Glasses of wine per week    Comment: occassional  . Drug use: No  . Sexual activity: Yes  Other Topics Concern  . Not on file  Social History Narrative  . Not on file   Social Determinants of Health   Financial Resource Strain: Not on file  Food Insecurity: Not on file  Transportation Needs: Not on file  Physical Activity: Not on file  Stress: Not on file  Social Connections: Not on file  Intimate Partner Violence: Not on file    Review of Systems: See HPI, otherwise negative ROS  Physical Exam: Ht _0  (1.854 m)   Wt 95.3 kg   BMI 27.71 kg/m  General:   Alert,  pleasant and cooperative in NAD Head:  Normocephalic and atraumatic. Neck:  Supple; no masses or thyromegaly. Lungs:  Clear throughout to auscultation.    Heart:  Regular rate and rhythm. Abdomen:  Soft, nontender and nondistended. Normal bowel sounds, without guarding, and  without rebound.   Neurologic:  Alert and  oriented x4;  grossly normal neurologically.  Impression/Plan: Gary Schultz is now here to undergo a screening colonoscopy.  Risks, benefits, and alternatives regarding colonoscopy have been reviewed with the patient.  Questions have been answered.  All parties agreeable.

## 2020-06-08 NOTE — Op Note (Signed)
[facility] Gastroenterology Patient Name: Gary Schultz Procedure Date: 06/08/2020 9:43 AM MRN: [id number] Account #: [id number] Date of Birth: [birth year] Admit Type: Outpatient Age: 55 Room: [facility] OR ROOM 01 Gender: Male Note Status: Finalized Procedure:             Colonoscopy Indications:           Screening for colorectal malignant neoplasm Providers:             Midge Minium MD, MD Referring MD:          Smitty Cords (Referring MD) Medicines:             Propofol per Anesthesia Complications:         No immediate complications. Procedure:             Pre-Anesthesia Assessment:                        - Prior to the procedure, a History and Physical was                         performed, and patient medications and allergies were                         reviewed. The patient's tolerance of previous                         anesthesia was also reviewed. The risks and benefits                         of the procedure and the sedation options and risks                         were discussed with the patient. All questions were                         answered, and informed consent was obtained. Prior                         Anticoagulants: The patient has taken no previous                         anticoagulant or antiplatelet agents. ASA Grade                         Assessment: II - A patient with mild systemic disease.                         After reviewing the risks and benefits, the patient                         was deemed in satisfactory condition to undergo the                         procedure.                        After obtaining informed consent, the colonoscope was  passed under direct vision. Throughout the procedure,                         the patient's blood pressure, pulse, and oxygen                         saturations were monitored continuously. The                         Colonoscope was introduced through  the anus and                         advanced to the the cecum, identified by appendiceal                         orifice and ileocecal valve. The colonoscopy was                         performed without difficulty. The patient tolerated                         the procedure well. The quality of the bowel                         preparation was excellent. Findings:      The perianal and digital rectal examinations were normal.      Non-bleeding internal hemorrhoids were found during retroflexion. The       hemorrhoids were Grade I (internal hemorrhoids that do not prolapse). Impression:            - Non-bleeding internal hemorrhoids.                        - No specimens collected. Recommendation:        - Discharge patient to home.                        - Resume previous diet.                        - Continue present medications.                        - Repeat colonoscopy in 10 years for screening                         purposes. Procedure Code(s):     --- Professional ---                        [phone], Colonoscopy, flexible; diagnostic, including                         collection of specimen(s) by brushing or washing, when                         performed (separate procedure) Diagnosis Code(s):     --- Professional ---                        Z12.11, Encounter for screening for malignant neoplasm  of colon CPT copyright 2019 American Medical Association. All rights reserved. The codes documented in this report are preliminary and upon coder review may  be revised to meet current compliance requirements. Midge Minium MD, MD 06/08/2020 10:09:05 AM This report has been signed electronically. Number of Addenda: 0 Note Initiated On: 06/08/2020 9:43 AM Scope Withdrawal Time: 0 hours 6 minutes 42 seconds  Total Procedure Duration: 0 hours 11 minutes 41 seconds  Estimated Blood Loss:  Estimated blood loss: none.      [facility]

## 2020-06-08 NOTE — Anesthesia Preprocedure Evaluation (Signed)
Anesthesia Evaluation  Patient identified by MRN, date of birth, ID band Patient awake    Reviewed: Allergy & Precautions, H&P , NPO status , Patient's Chart, lab work & pertinent test results  Airway Mallampati: II  TM Distance: >3 FB Neck ROM: full    Dental no notable dental hx.    Pulmonary    Pulmonary exam normal breath sounds clear to auscultation       Cardiovascular hypertension, Normal cardiovascular exam Rhythm:regular Rate:Normal     Neuro/Psych PSYCHIATRIC DISORDERS  Neuromuscular disease    GI/Hepatic GERD  ,  Endo/Other    Renal/GU      Musculoskeletal   Abdominal   Peds  Hematology   Anesthesia Other Findings   Reproductive/Obstetrics                             Anesthesia Physical Anesthesia Plan  ASA: II  Anesthesia Plan: General   Post-op Pain Management:    Induction: Intravenous  PONV Risk Score and Plan: 2 and Treatment may vary due to age or medical condition, Propofol infusion and TIVA  Airway Management Planned: Natural Airway  Additional Equipment:   Intra-op Plan:   Post-operative Plan:   Informed Consent: I have reviewed the patients History and Physical, chart, labs and discussed the procedure including the risks, benefits and alternatives for the proposed anesthesia with the patient or authorized representative who has indicated his/her understanding and acceptance.     Dental Advisory Given  Plan Discussed with: CRNA  Anesthesia Plan Comments:         Anesthesia Quick Evaluation

## 2020-06-11 ENCOUNTER — Telehealth: Payer: Self-pay | Admitting: Gastroenterology

## 2020-06-11 NOTE — Telephone Encounter (Signed)
Gastroenterology Pre-Procedure Review  Request Date: 06/04/20  Requesting Physician: Dr. Allen Norris   PATIENT REVIEW QUESTIONS: The patient responded to the following health history questions as indicated:    1. Are you having any GI issues? no 2. Do you have a personal history of Polyps? no 3. Do you have a family history of Colon Cancer or Polyps? yes (Mom w/ rectal cancer) 4. Diabetes Mellitus? no 5. Joint replacements in the past 12 months?no 6. Major health problems in the past 3 months?no 7. Any artificial heart valves, MVP, or defibrillator?no    MEDICATIONS & ALLERGIES:    Patient reports the following regarding taking any anticoagulation/antiplatelet therapy:   Plavix, Coumadin, Eliquis, Xarelto, Lovenox, Pradaxa, Brilinta, or Effient? no Aspirin? no  BMI 27.7 Pulomonary: no  Patient confirms/reports the following medications:  Current Outpatient Medications  Medication Sig Dispense Refill  . amLODipine (NORVASC) 10 MG tablet Take 1 tablet (10 mg total) by mouth daily. 90 tablet 1  . Ascorbic Acid (VITAMIN C PO) Take 1 tablet by mouth daily.    . diclofenac Sodium (VOLTAREN) 1 % GEL Apply 1 application topically 4 (four) times daily as needed (pain).    . fluticasone (FLONASE) 50 MCG/ACT nasal spray Place into both nostrils daily as needed for allergies or rhinitis.    Marland Kitchen gabapentin (NEURONTIN) 300 MG capsule Take 300-900 mg by mouth 3 (three) times daily as needed (pain).    Marland Kitchen GINSENG PO Take 2 capsules by mouth daily as needed (energy).     Marland Kitchen lidocaine (LIDODERM) 5 % Place 1 patch onto the skin daily.    Mar[place] Kitchen lisinopril (ZESTRIL) 30 MG tablet Take 1 tablet (30 mg total) by mouth daily. 90 tablet 1  . loratadine (CLARITIN) 10 MG tablet Take 10 mg by mouth daily as needed for allergies.    . Misc Natural Products (HORNY GOAT WEED PO) Take 1 Dose by mouth daily in the afternoon. (Patient not taking: Reported on 05/29/2020)    . Misc Natural Products (PROSTATE HEALTH) CAPS Take 1  capsule by mouth daily.    . Na Sulfate-K Sulfate-Mg Sulf (SUPREP BOWEL PREP KIT) 17.5-3.13-1.6 GM/177ML SOLN Take 1 kit by mouth as directed. 354 mL 0  . omeprazole (PRILOSEC) 20 MG capsule TAKE 1 CAPSULE BY MOUTH ONCE DAILY BEFORE BREAKFAST 90 capsule 0  . Oxycodone HCl 10 MG TABS Take 1 tablet (10 mg total) by mouth 4 (four) times daily as needed (Breakthrough pain). 30 tablet 0  . Oxycodone HCl 20 MG TABS Take 20 mg by mouth 5 (five) times daily as needed (pain).    . TURMERIC PO Take 1 capsule by mouth daily.    [place] Kitchen VITAMIN D PO Take 1 tablet by mouth daily.     No current facility-administered medications for this visit.    Patient confirms/reports the following allergies:  Allergies  Allergen Reactions  . Tylenol [Acetaminophen] Hives, Itching and Rash    No orders of the defined types were placed in this encounter.   AUTHORIZATION INFORMATION Primary Insurance: 1D#: Group #:  Secondary Insurance: 1D#: Group #:  SCHEDULE INFORMATION: Date:  Time: Location:

## 2020-07-27 ENCOUNTER — Other Ambulatory Visit: Payer: Self-pay | Admitting: Family Medicine

## 2020-07-27 DIAGNOSIS — R142 Eructation: Secondary | ICD-10-CM

## 2020-07-27 DIAGNOSIS — I10 Essential (primary) hypertension: Secondary | ICD-10-CM

## 2020-11-05 ENCOUNTER — Other Ambulatory Visit: Payer: Self-pay | Admitting: Family Medicine

## 2020-11-05 DIAGNOSIS — R142 Eructation: Secondary | ICD-10-CM

## 2021-02-03 ENCOUNTER — Other Ambulatory Visit: Payer: Self-pay | Admitting: Family Medicine

## 2021-02-03 DIAGNOSIS — I10 Essential (primary) hypertension: Secondary | ICD-10-CM

## 2021-02-03 NOTE — Telephone Encounter (Signed)
Requested Prescriptions  Pending Prescriptions Disp Refills   lisinopril (ZESTRIL) 30 MG tablet [Pharmacy Med Name: LISINOPRIL 30 MG TABLET] 30 tablet 0    Sig: TAKE 1 TABLET BY MOUTH EVERY DAY     Cardiovascular:  ACE Inhibitors Failed - 02/03/2021 12:46 AM      Failed - Cr in normal range and within 180 days    Creat  Date Value Ref Range Status  05/14/2020 0.86 0.70 - 1.33 mg/dL Final    Comment:    For patients >53 years of age, the reference limit for Creatinine is approximately 13% higher for people identified as African-American. .          Failed - K in normal range and within 180 days    Potassium  Date Value Ref Range Status  05/14/2020 4.2 3.5 - 5.3 mmol/L Final         Failed - Valid encounter within last 6 months    Recent Outpatient Visits          8 months ago Annual physical exam   [facility] Smitty Cords, DO   11 months ago Major depressive disorder, recurrent, moderate (HCC)   [facility] Smitty Cords, DO   1 year ago Chronic post-traumatic headache, not intractable   [facility] [place], Netta Neat, DO   2 years ago Atherosclerosis of right carotid artery   [facility] [place], Netta Neat, DO   3 years ago Acute non-recurrent maxillary sinusitis   [facility] Smitty Cords, [place]             [id number] - Patient is not pregnant      Passed - Last BP in normal range    BP Readings from Last 1 Encounters:  06/08/20 108/61          amLODipine (NORVASC) 10 MG tablet [Pharmacy Med Name: AMLODIPINE BESYLATE 10 MG TAB] 30 tablet 0    Sig: TAKE 1 TABLET BY MOUTH EVERY DAY     Cardiovascular:  Calcium Channel Blockers Failed - 02/03/2021 12:46 AM      Failed - Valid encounter within last 6 months    Recent Outpatient Visits          8 months ago Annual physical exam   [facility] Smitty Cords, DO    11 months ago Major depressive disorder, recurrent, moderate (HCC)   [facility] Smitty Cords, DO   1 year ago Chronic post-traumatic headache, not intractable   [facility] [place], Netta Neat, DO   2 years ago Atherosclerosis of right carotid artery   [facility] [place], Netta Neat, DO   3 years ago Acute non-recurrent maxillary sinusitis   [facility], Netta Neat, DO             Passed - Last BP in normal range    BP Readings from Last 1 Encounters:  06/08/20 108/61

## 2021-02-11 ENCOUNTER — Other Ambulatory Visit: Payer: Self-pay | Admitting: Family Medicine

## 2021-02-11 DIAGNOSIS — I10 Essential (primary) hypertension: Secondary | ICD-10-CM

## 2021-02-12 NOTE — Telephone Encounter (Signed)
Requested medication (s) are due for refill today: no  Requested medication (s) are on the active medication list: yes  Last refill:  02/01/21 #30/0RF  Future visit scheduled: no  Notes to clinic:  pt requesting 90 days supply. Pt due for appt, called and left VM     Requested Prescriptions  Pending Prescriptions Disp Refills   amLODipine (NORVASC) 10 MG tablet [Pharmacy Med Name: AMLODIPINE BESYLATE 10 MG TAB] 90 tablet 1    Sig: TAKE 1 TABLET BY MOUTH EVERY DAY     Cardiovascular:  Calcium Channel Blockers Failed - 02/11/2021  9:38 AM      Failed - Valid encounter within last 6 months    Recent Outpatient Visits           9 months ago Annual physical exam   [facility] Smitty Cords, DO   11 months ago Major depressive disorder, recurrent, moderate (HCC)   [facility] Smitty Cords, DO   1 year ago Chronic post-traumatic headache, not intractable   [facility] [place], Netta Neat, DO   2 years ago Atherosclerosis of right carotid artery   [facility] [place], Netta Neat, DO   3 years ago Acute non-recurrent maxillary sinusitis   [facility] Smitty Cords, DO              Passed - Last BP in normal range    BP Readings from Last 1 Encounters:  06/08/20 108/61

## 2021-02-12 NOTE — Telephone Encounter (Signed)
Pt called, left VM to call office back and schedule appt for med refill request.

## 2021-03-01 ENCOUNTER — Other Ambulatory Visit: Payer: Self-pay | Admitting: Surgery

## 2021-03-02 ENCOUNTER — Other Ambulatory Visit: Payer: Self-pay | Admitting: Family Medicine

## 2021-03-02 DIAGNOSIS — I10 Essential (primary) hypertension: Secondary | ICD-10-CM

## 2021-03-02 NOTE — Telephone Encounter (Signed)
Requested medication (s) are due for refill today: yes  Requested medication (s) are on the active medication list: yes  Last refill:  02/03/21 #30  Future visit scheduled: no  Notes to clinic:  LM on pt's VM to call office to make an appt for refills   Requested Prescriptions  Pending Prescriptions Disp Refills   lisinopril (ZESTRIL) 30 MG tablet [Pharmacy Med Name: LISINOPRIL 30 MG TABLET] 30 tablet 0    Sig: TAKE 1 TABLET BY MOUTH EVERY DAY     Cardiovascular:  ACE Inhibitors Failed - 03/02/2021 11:31 AM      Failed - Cr in normal range and within 180 days    Creat  Date Value Ref Range Status  05/14/2020 0.86 0.70 - 1.33 mg/dL Final    Comment:    For patients >68 years of age, the reference limit for Creatinine is approximately 13% higher for people identified as African-American. .           Failed - K in normal range and within 180 days    Potassium  Date Value Ref Range Status  05/14/2020 4.2 3.5 - 5.3 mmol/L Final          Failed - Valid encounter within last 6 months    Recent Outpatient Visits           9 months ago Annual physical exam   [place], DO   11 months ago Major depressive disorder, recurrent, moderate ([place])   [place], DO   1 year ago Chronic post-traumatic headache, not intractable   [place], DO   2 years ago Atherosclerosis of right carotid artery   [place], DO   3 years ago Acute non-recurrent maxillary sinusitis   [place], [place] - Patient is not pregnant      Passed - Last BP in normal range    BP Readings from Last 1 Encounters:  06/08/20 108/61

## 2021-03-04 ENCOUNTER — Encounter
Admission: RE | Admit: 2021-03-04 | Discharge: 2021-03-04 | Disposition: A | Discharge status: Home / Self Care | Payer: 59 | Source: Ambulatory Visit | Attending: Surgery | Admitting: Surgery

## 2021-03-04 ENCOUNTER — Other Ambulatory Visit: Payer: Self-pay

## 2021-03-04 VITALS — Ht 73.0 in | Wt 210.0 lb

## 2021-03-04 DIAGNOSIS — I10 Essential (primary) hypertension: Secondary | ICD-10-CM

## 2021-03-04 DIAGNOSIS — Z01812 Encounter for preprocedural laboratory examination: Secondary | ICD-10-CM

## 2021-03-04 NOTE — Patient Instructions (Addendum)
Your procedure is scheduled on: Thursday, January 19 Report to the Registration Desk on the 1st floor of the [facility] To find out your arrival time, please call [phone] between 1PM - 3PM on: Wednesday, January 18  REMEMBER: Instructions that are not followed completely may result in serious medical risk, up to and including death; or upon the discretion of your surgeon and anesthesiologist your surgery may need to be rescheduled.  Do not eat food after midnight the night before surgery.  No gum chewing, lozengers or hard candies.  You may however, drink CLEAR liquids up to 2 hours before you are scheduled to arrive for your surgery. Do not drink anything within 2 hours of your scheduled arrival time.  Clear liquids include: - water  - apple juice without pulp - gatorade (not RED, PURPLE, OR BLUE) - black coffee or tea (Do NOT add milk or creamers to the coffee or tea) Do NOT drink anything that is not on this list.  In addition, your doctor has ordered for you to drink the provided  Ensure Pre-Surgery Clear Carbohydrate Drink  Drinking this carbohydrate drink up to two hours before surgery helps to reduce insulin resistance and improve patient outcomes. Please complete drinking 2 hours prior to scheduled arrival time.  TAKE THESE MEDICATIONS THE MORNING OF SURGERY WITH A SIP OF WATER:  Gabapentin or oxycodone if needed for pain Omeprazole (Prilosec) - (take one the night before and one on the morning of surgery - helps to prevent nausea after surgery.)  One week prior to surgery: starting January 12 Stop Anti-inflammatories (NSAIDS) such as Advil, Aleve, Ibuprofen, Motrin, Naproxen, Naprosyn and Aspirin based products such as Excedrin, Goodys Powder, BC Powder. Stop ANY OVER THE COUNTER supplements until after surgery. You may however, continue to take Tylenol if needed for pain up until the day of surgery.  No Alcohol for 24 hours before or after surgery.  No Smoking  including e-cigarettes for 24 hours prior to surgery.  No chewable tobacco products for at least 6 hours prior to surgery.  No nicotine patches on the day of surgery.  Do not use any "recreational" drugs for at least a week prior to your surgery.  Please be advised that the combination of cocaine and anesthesia may have negative outcomes, up to and including death. If you test positive for cocaine, your surgery will be cancelled.  On the morning of surgery brush your teeth with toothpaste and water, you may rinse your mouth with mouthwash if you wish. Do not swallow any toothpaste or mouthwash.  Use CHG Soap as directed on instruction sheet.  Do not wear jewelry.  Do not wear lotions, powders, or perfumes.   Do not shave body from the neck down 48 hours prior to surgery just in case you cut yourself which could leave a site for infection.  Also, freshly shaved skin may become irritated if using the CHG soap.  Do not bring valuables to the hospital. [facility] is not responsible for any missing/lost belongings or valuables.   Notify your doctor if there is any change in your medical condition (cold, fever, infection).  Wear comfortable clothing (specific to your surgery type) to the hospital.  After surgery, you can help prevent lung complications by doing breathing exercises.  Take deep breaths and cough every 1-2 hours. Your doctor may order a device called an Incentive Spirometer to help you take deep breaths.  If you are being discharged the day of surgery, you  will not be allowed to drive home. You will need a responsible adult (18 years or older) to drive you home and stay with you that night.   If you are taking public transportation, you will need to have a responsible adult (18 years or older) with you. Please confirm with your physician that it is acceptable to use public transportation.   Please call the Pre-admissions Testing Dept. at [phone] if you have any  questions about these instructions.  Surgery Visitation Policy:  Patients undergoing a surgery or procedure may have one family member or support person with them as long as that person is not COVID-19 positive or experiencing its symptoms.  That person may remain in the waiting area during the procedure and may rotate out with other people.

## 2021-03-05 ENCOUNTER — Inpatient Hospital Stay: Admission: RE | Admit: 2021-03-05 | Payer: 59 | Source: Ambulatory Visit

## 2021-03-07 ENCOUNTER — Encounter: Admission: RE | Payer: Self-pay | Source: Home / Self Care

## 2021-03-07 ENCOUNTER — Ambulatory Visit: Admission: RE | Admit: 2021-03-07 | Payer: 59 | Source: Home / Self Care | Admitting: Surgery

## 2021-03-07 SURGERY — TENNIS ELBOW RELEASE/NIRSCHEL PROCEDURE
Anesthesia: Choice | Site: Elbow | Laterality: Left

## 2021-03-11 ENCOUNTER — Other Ambulatory Visit: Payer: Self-pay

## 2021-03-11 ENCOUNTER — Ambulatory Visit (INDEPENDENT_AMBULATORY_CARE_PROVIDER_SITE_OTHER): Payer: 59 | Admitting: Family Medicine

## 2021-03-11 ENCOUNTER — Other Ambulatory Visit: Payer: Self-pay | Admitting: Family Medicine

## 2021-03-11 VITALS — BP 129/70 | HR 88 | Ht 73.0 in | Wt 208.6 lb

## 2021-03-11 DIAGNOSIS — N401 Enlarged prostate with lower urinary tract symptoms: Secondary | ICD-10-CM

## 2021-03-11 DIAGNOSIS — R142 Eructation: Secondary | ICD-10-CM

## 2021-03-11 DIAGNOSIS — R351 Nocturia: Secondary | ICD-10-CM

## 2021-03-11 DIAGNOSIS — E7800 Pure hypercholesterolemia, unspecified: Secondary | ICD-10-CM

## 2021-03-11 DIAGNOSIS — N529 Male erectile dysfunction, unspecified: Secondary | ICD-10-CM | POA: Diagnosis not present

## 2021-03-11 DIAGNOSIS — Z0000 Encounter for general adult medical examination without abnormal findings: Secondary | ICD-10-CM

## 2021-03-11 DIAGNOSIS — R7309 Other abnormal glucose: Secondary | ICD-10-CM

## 2021-03-11 DIAGNOSIS — I10 Essential (primary) hypertension: Secondary | ICD-10-CM

## 2021-03-11 MED ORDER — TADALAFIL 20 MG PO TABS: 10.0000 mg | ORAL_TABLET | ORAL | 5 refills | Status: DC | PRN

## 2021-03-11 MED ORDER — LISINOPRIL 30 MG PO TABS: 30.0000 mg | ORAL_TABLET | Freq: Every day | ORAL | 3 refills | Status: DC

## 2021-03-11 MED ORDER — OMEPRAZOLE 20 MG PO CPDR: 20.0000 mg | DELAYED_RELEASE_CAPSULE | Freq: Every day | ORAL | 3 refills | Status: DC

## 2021-03-11 NOTE — Patient Instructions (Addendum)
Thank you for coming to the office today.  Future Shingrix vaccine  Refilled other meds  Start Tadalafil (generic Cialis) 20mg  - take HALF or a WHOLE tablet about 30 min prior to sexual intercourse for improved erection.   Once you take a dose, you have to wait 48 hours to repeat a dose.  You will need to use www.goodrx.com website or app on phone to enter  Follow up if not working or new concerns we can refer you to a Urologist.  DUE for FASTING BLOOD WORK (no food or drink after midnight before the lab appointment, only water or coffee without cream/sugar on the morning of)  SCHEDULE "Lab Only" visit in the morning at the clinic for lab draw in 3 MONTHS   - Make sure Lab Only appointment is at about 1 week before your next appointment, so that results will be available  For Lab Results, once available within 2-3 days of blood draw, you can can log in to MyChart online to view your results and a brief explanation. Also, we can discuss results at next follow-up visit.    Please schedule a Follow-up Appointment to: Return in about 3 months (around 06/09/2021) for 3 month fasting lab only then 1 week later Annual Physical.  If you have any other questions or concerns, please feel free to call the office or send a message through MyChart. You may also schedule an earlier appointment if necessary.  Additionally, you may be receiving a survey about your experience at our office within a few days to 1 week by e-mail or mail. We value your feedback.  06/11/2021, DO [facility], [facility]
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
## 2021-06-19 ENCOUNTER — Ambulatory Visit: Payer: 59 | Admitting: Family Medicine

## 2021-06-27 IMAGING — MR MRI OF THE LEFT KNEE WITHOUT CONTRAST
6 series · 40 of 40 positions shown · non-contrast
Comparison: 11/08/2015

CLINICAL DATA: Chronic pain in both knees.  Osteoarthritis.

EXAM:
MRI OF THE LEFT KNEE WITHOUT CONTRAST
TECHNIQUE: Multiplanar, multisequence MR imaging of the knee was performed. No
intravenous contrast was administered.

[Series 8: T2 fat-sat · axial · B · 4.0mm · 0.50mm/px · z∈[-93,+32]mm · 7 of 26 slices shown (1 of 3)]
[im 1/26]
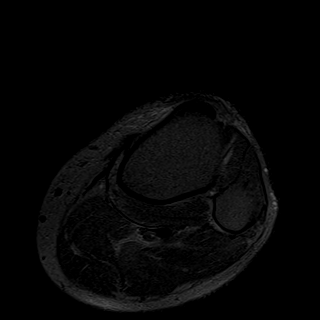
[im 5/26]
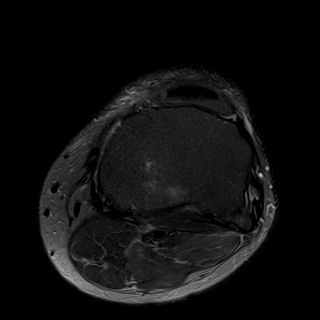
[im 9/26]
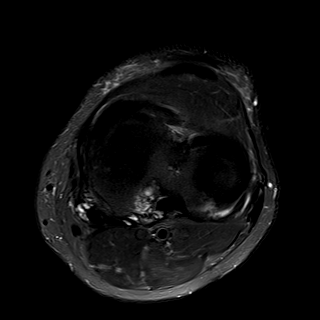
[im 13/26]
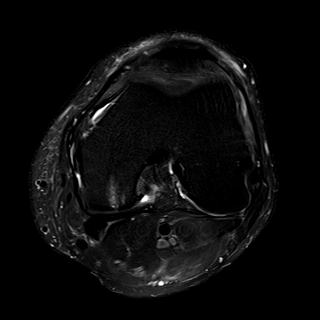
[im 17/26]
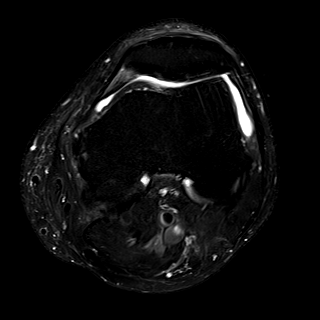
[im 21/26]
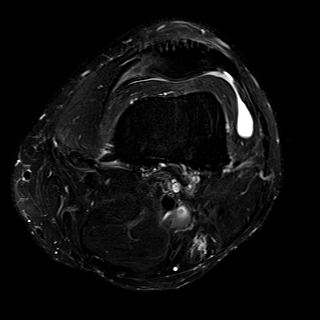
[im 26/26]
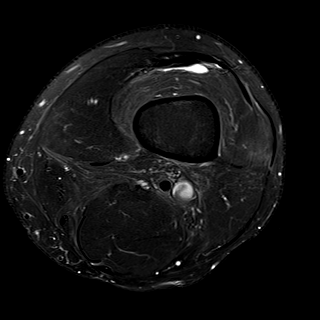

[Series 9: T1 · coronal · B · 4.0mm · 0.59mm/px · 6 of 30 slices shown]
[im 1/30]
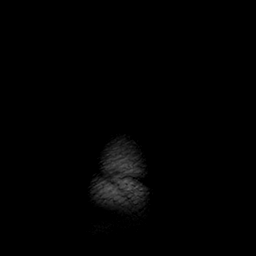
[im 6/30]
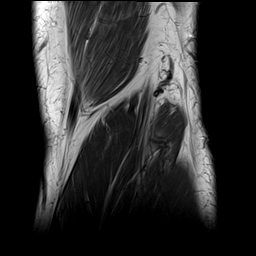
[im 12/30]
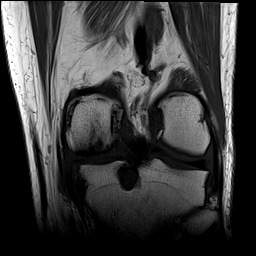
[im 18/30]
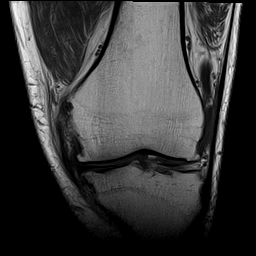
[im 24/30]
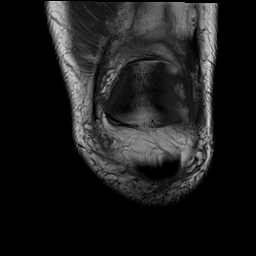
[im 30/30]
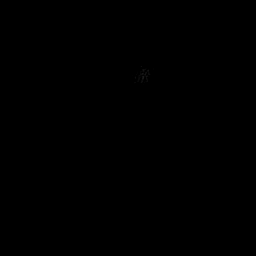

[Series 10: T2 fat-sat · coronal · B · 4.0mm · 0.59mm/px · 6 of 30 slices shown (2 of 3)]
[im 1/30]
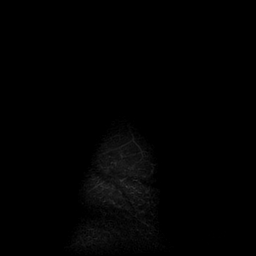
[im 6/30]
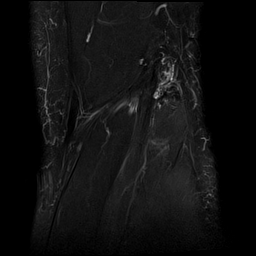
[im 12/30]
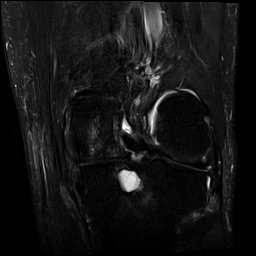
[im 18/30]
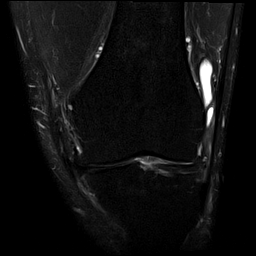
[im 24/30]
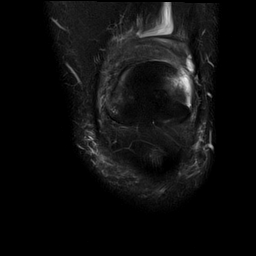
[im 30/30]
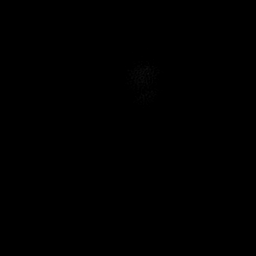

[Series 11: PD fat-sat · coronal · B · 4.0mm · 0.59mm/px · 6 of 30 slices shown (1 of 2)]
[im 1/30]
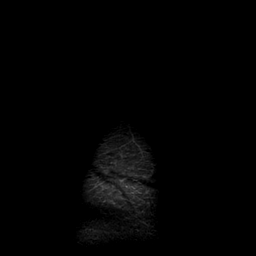
[im 6/30]
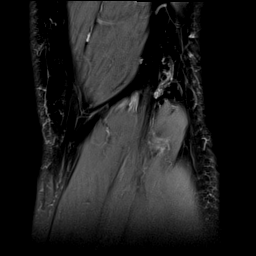
[im 12/30]
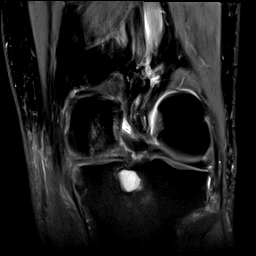
[im 18/30]
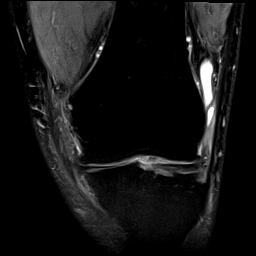
[im 24/30]
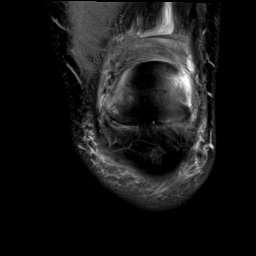
[im 30/30]
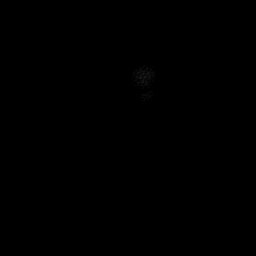

[Series 12: PD fat-sat · sagittal · B · 3.0mm · 0.59mm/px · 7 of 33 slices shown (2 of 2)]
[im 1/33]
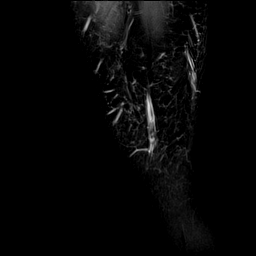
[im 6/33]
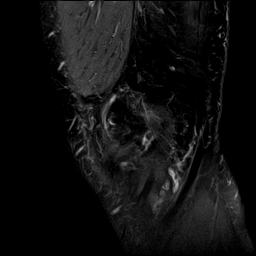
[im 11/33]
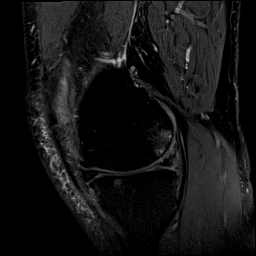
[im 17/33]
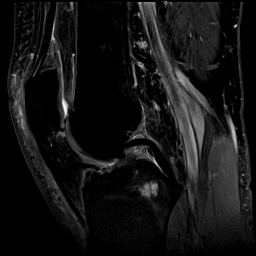
[im 22/33]
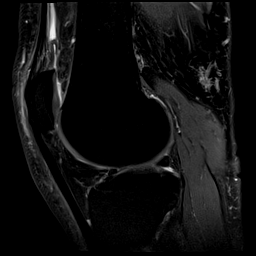
[im 27/33]
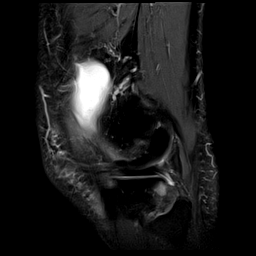
[im 33/33]
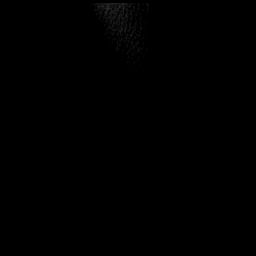

[Series 13: T2 fat-sat · sagittal · B · 3.0mm · 0.59mm/px · 8 of 36 slices shown (3 of 3)]
[im 1/36]
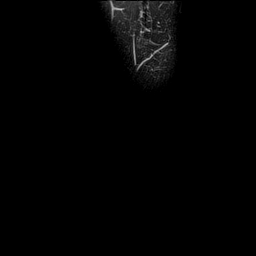
[im 6/36]
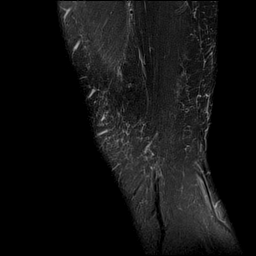
[im 11/36]
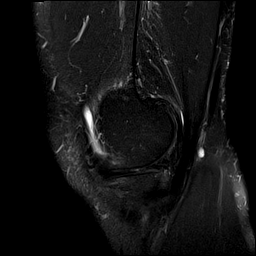
[im 16/36]
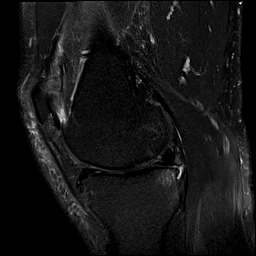
[im 21/36]
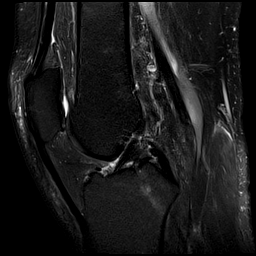
[im 26/36]
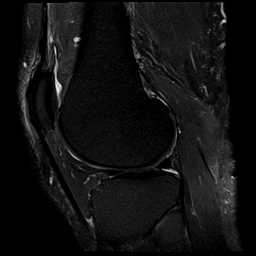
[im 31/36]
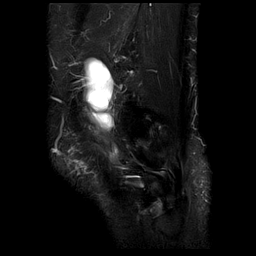
[im 36/36]
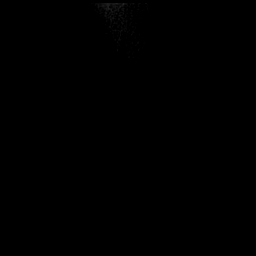

[40 of 40 positions shown; findings below may reference images not displayed]

FINDINGS: MENISCI

Medial meniscus: Irregular degenerative and likely complex tearing
of the posterior horn and midbody of the medial meniscus with a
somewhat diminutive midbody, truncated free edge, and irregular
oblique grade 3 signal extending to the superior surface.

Lateral meniscus:  Unremarkable

LIGAMENTS

Cruciates:  Unremarkable

Collaterals:  Trace MCL bursitis.

CARTILAGE

Patellofemoral: Mild degenerative chondral thinning with mild
chondral irregularity along the posterior patellar ridge for example
on image [DATE]. Mild marginal spurring.

Medial: Moderate to prominent degenerative chondral thinning with
marginal spurring. Degenerative subcortical cyst formation
posteriorly along the medial femoral condyle with mild adjacent
marrow edema.

Lateral:  Marginal spurring.

Joint: Small knee effusion with thickened medial plica and superior
plica. There is potentially some mild focal synovitis adjacent to
the thickened medial plica for example on image [DATE].

Popliteal Fossa: Very small Baker's cyst. Septated and irregular
fluid collection posteromedially adjacent to the semimembranosus
tendon on image [DATE], probably a parameniscal cyst. Subtle edema
signal in the lateral head gastrocnemius muscle for example on image
[DATE].

Extensor Mechanism: Subtle linear increased signal in the distal
lateral quadriceps tendon is likely incidental but in the
appropriate clinical setting could represent a quadriceps sprain.

Bones: Fluid signal intensity geode along the posteromedial tibial
spine.

Other: Prepatellar subcutaneous edema.
IMPRESSION: 1. Complex degenerative tearing of the posterior horn and midbody
medial meniscus with parameniscal cyst extending posteriorly
adjacent to the semimembranosus tendon.
2. Variable degree of degenerative chondral thinning, moderate to
prominent in the medial compartment.
3. Small knee effusion with thickened medial plica and superior
plica. Focal synovitis adjacent to the thickened medial plica.
4. Trace MCL bursitis.
5. Very small Baker's cyst.
6. Subtle linear increased signal in the distal lateral quadriceps
tendon is likely incidental but in the appropriate clinical setting
could represent a quadriceps sprain.
7. Prepatellar subcutaneous edema.

## 2021-07-16 ENCOUNTER — Ambulatory Visit: Payer: Self-pay

## 2021-07-16 NOTE — Telephone Encounter (Signed)
    Chief Complaint: Pt. [place] this morning getting out of shower. Fell on buttocks and hurt his knees and back. Symptoms: Pain to back and knees Frequency: Today Pertinent Negatives: Patient denies any injury. "I'm just sore from falling." Disposition: [] ED /[] Urgent Care (no appt availability in office) / [] Appointment(In office/virtual)/ []  [facility]/ [] Home Care/ [] Refused Recommended Disposition /[] [facility]/ Follow-up with PCP Additional Notes: Warm transfer to [facility] in the practice. Pt. Asking for an electric scooter.  Answer Assessment - Initial Assessment Questions 1. MECHANISM: "How did the fall happen?"     [place] getting out of shower 2. DOMESTIC VIOLENCE AND ELDER ABUSE SCREENING: "Did you fall because someone pushed you or tried to hurt you?" If Yes, ask: "Are you safe now?"     No 3. ONSET: "When did the fall happen?" (e.g., minutes, hours, or days ago)     Today 4. LOCATION: "What part of the body hit the ground?" (e.g., back, buttocks, head, hips, knees, hands, head, stomach)     Buttocks 5. INJURY: "Did you hurt (injure) yourself when you fell?" If Yes, ask: "What did you injure? Tell me more about this?" (e.g., body area; type of injury; pain severity)"     Right wrist and buttocks, back pain 6. PAIN: "Is there any pain?" If Yes, ask: "How bad is the pain?" (e.g., Scale 1-10; or mild,  moderate, severe)   - NONE (0): No pain   - MILD (1-3): Doesn't interfere with normal activities    - MODERATE (4-7): Interferes with normal activities or awakens from sleep    - SEVERE (8-10): Excruciating pain, unable to do any normal activities      Now - 7-8 7. SIZE: For cuts, bruises, or swelling, ask: "How large is it?" (e.g., inches or centimeters)      No 8. PREGNANCY: "Is there any chance you are pregnant?" "When was your last menstrual period?"     N/a 9. OTHER SYMPTOMS: "Do you have any other symptoms?" (e.g., dizziness, fever, weakness; new onset or  worsening).      Pain 10. CAUSE: "What do you think caused the fall (or falling)?" (e.g., tripped, dizzy spell)       Slipped  Protocols used: Falls and [facility]

## 2021-07-18 ENCOUNTER — Encounter: Payer: Self-pay | Admitting: Family Medicine

## 2021-07-18 ENCOUNTER — Ambulatory Visit (INDEPENDENT_AMBULATORY_CARE_PROVIDER_SITE_OTHER): Payer: 59 | Admitting: Family Medicine

## 2021-07-18 VITALS — BP 140/90 | HR 80 | Ht 73.0 in | Wt 210.4 lb

## 2021-07-18 DIAGNOSIS — R296 Repeated falls: Secondary | ICD-10-CM | POA: Diagnosis not present

## 2021-07-18 DIAGNOSIS — M170 Bilateral primary osteoarthritis of knee: Secondary | ICD-10-CM

## 2021-07-18 DIAGNOSIS — G8929 Other chronic pain: Secondary | ICD-10-CM

## 2021-07-18 DIAGNOSIS — M25561 Pain in right knee: Secondary | ICD-10-CM

## 2021-07-18 DIAGNOSIS — M25562 Pain in left knee: Secondary | ICD-10-CM | POA: Diagnosis not present

## 2021-07-18 NOTE — Patient Instructions (Addendum)
Thank you for coming to the office today.  Scooter (power operated vehicle)  Handwritten rx today to take to any of these locations.  If you can find a place in network that covers it under insurance. They can send [place] an order form once you place the order.  DME  [facility] [address] [place], [place] [address] Ph: [phone]  [facility] [address] [place], [place] [address] Ph: [phone] Fax: [phone]  Home Medical Equipment (Wheelchairs etc) Retail: [phone] Customer Service: [phone] Toll Free: [phone] FAX: [phone]  [facility] [phone])  [facility] [phone])   [facility] [address] [place], [place] [address] Open until 5PM Phone: [phone] Fax: [phone]  [facility] [address] [place], [place] [address] Ph [phone] Fax [phone] - Lateral shoe wedges as well   Please schedule a Follow-up Appointment to: Return if symptoms worsen or fail to improve.  If you have any other questions or concerns, please feel free to call the office or send a message through MyChart. You may also schedule an earlier appointment if necessary.  Additionally, you may be receiving a survey about your experience at our office within a few days to 1 week by e-mail or mail. We value your feedback.  Saralyn Pilar, DO [facility], [place]

## 2021-07-18 NOTE — Progress Notes (Signed)
Subjective:    Patient ID: Gary Schultz, male    DOB: [birth year], 56 y.o.   MRN: [id number]  Gary Schultz is a 56 y.o. 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 He had episodes of vertigo related No significant injuries He uses cane for ambulation at this time. His daughter is living with him now to help and she is pregnant due in 2-3 months.  Fall injury on R shoulder  Pain Management Dr Primus Bravo   Chief Complaint: MOBILITY EXAMINATION   1. Change to now require Scooter Mobility Device (POV) Worsening recurrent falls due to advanced osteoarthritis with chronic knee pain. Impairing his mobility of daily activities. He has difficulty with pain and stiffness with standing from seated position and difficulty with ambulation unassisted. Has used cane before and he has fallen.     ----------------------------------------------------        07/18/2021   11:26 AM 03/11/2021    1:20 PM 05/16/2020    1:32 PM  Depression screen PHQ 2/9  Decreased Interest 2 0 0  Down, Depressed, Hopeless 1 0 0  PHQ - 2 Score 3 0 0  Altered sleeping 2 0 0  Tired, decreased energy 1 0 1  Change in appetite 0 0 0  Feeling bad or failure about yourself  1 0 0  Trouble concentrating 1 2 0  Moving slowly or fidgety/restless 0 0 0  Suicidal thoughts 0 0 0  PHQ-9 Score 8 2 1   Difficult doing work/chores Not difficult at all Not difficult at all Not difficult at all    Social History   Tobacco Use   Smoking status: Never   Smokeless tobacco: Never  Vaping Use   Vaping Use: Never used  Substance Use Topics   Alcohol use: Yes    Alcohol/week: 3.0 standard drinks    Types: 3 Glasses  of wine per week    Comment: occassional   Drug use: No    Review of Systems Per HPI unless specifically indicated above     Objective:    BP 140/90   Pulse 80   Ht 6\' 1"  (1.854 m)   Wt 210 lb 6.4 oz (95.4 kg)   SpO2 99%   BMI 27.76 kg/m   Wt Readings from Last 3 Encounters:  07/18/21 210 lb 6.4 oz (95.4 kg)  03/11/21 208 lb 9.6 oz (94.6 kg)  03/04/21 210 lb (95.3 kg)    Physical Exam Vitals and nursing note reviewed.  Constitutional:      General: He is not in acute distress.    Appearance: Normal appearance. He is well-developed. He is not diaphoretic.     Comments: Well-appearing, comfortable, cooperative  HENT:     Head: Normocephalic and atraumatic.  Eyes:     General:        Right eye: No discharge.        Left eye: No discharge.     Conjunctiva/sclera: Conjunctivae normal.  Cardiovascular:     Rate and Rhythm: Normal rate.  Pulmonary:     Effort: Pulmonary effort is normal.  Musculoskeletal:     Comments: Bilateral knee bulkiness, crepitus, stiffness on ROM R>L  Skin:  General: Skin is warm and dry.     Findings: No erythema or rash.  Neurological:     Mental Status: He is alert and oriented to person, place, and time.  Psychiatric:        Mood and Affect: Mood normal.        Behavior: Behavior normal.        Thought Content: Thought content normal.     Comments: Well groomed, good eye contact, normal speech and thoughts     2. Height = 6\' 1" , Weight = 210 lbs   3. -  O2 Saturation = 99% room air - Edema = positive for some edema on Right knee and Left lower extremity - Pressure Sores = None actively - Ability to Shift Weight = Able to shift weight and stand up from seated   Upper and Lower Extremity Assessment RUE - Strength - 4 out of 5 - Pain - 3 out of 10 - Range of Motion - Reduced ROM right shoulder   LUE - Strength - 4 out of 5 - Pain - 2 out of 10 - Range of Motion - mostly full range   RLE - Strength 3 out of 5 - Pain - 6  out of 10 - Range of Motion - reduced range of motion with knee flexion and extension, stiffness   LLE - Strength - 4 out of 5 - Pain - 6 out of 10 - Range of Motion - reduced range of motion with knee flexion and extension   - Gait Pattern: Able to stand unassisted, unsteady initially on feet, stiffness with knees limiting his gait.  Results for orders placed or performed in visit on 05/11/20  Lipid panel  Result Value Ref Range   Cholesterol 193 <200 mg/dL   HDL 61 > OR = 40 mg/dL   Triglycerides 73 <150 mg/dL   LDL Cholesterol (Calc) 115 (H) mg/dL (calc)   Total CHOL/HDL Ratio 3.2 <5.0 (calc)   Non-HDL Cholesterol (Calc) 132 (H) <130 mg/dL (calc)  CBC with Differential/Platelet  Result Value Ref Range   WBC 5.8 3.8 - 10.8 Thousand/uL   RBC 4.72 4.20 - 5.80 Million/uL   Hemoglobin 14.2 13.2 - 17.1 g/dL   HCT 42.1 38.5 - 50.0 %   MCV 89.2 80.0 - 100.0 fL   MCH 30.1 27.0 - 33.0 pg   MCHC 33.7 32.0 - 36.0 g/dL   RDW 12.3 11.0 - 15.0 %   Platelets 166 140 - 400 Thousand/uL   MPV 11.2 7.5 - 12.5 fL   Neutro Abs 3,445 1,500 - 7,800 cells/uL   Lymphs Abs 1,560 850 - 3,900 cells/uL   Absolute Monocytes 673 200 - 950 cells/uL   Eosinophils Absolute 93 15 - 500 cells/uL   Basophils Absolute 29 0 - 200 cells/uL   Neutrophils Relative % 59.4 %   Total Lymphocyte 26.9 %   Monocytes Relative 11.6 %   Eosinophils Relative 1.6 %   Basophils Relative 0.5 %  PSA  Result Value Ref Range   PSA 0.45 < OR = 4.0 ng/mL  Hemoglobin A1c  Result Value Ref Range   Hgb A1c MFr Bld 5.2 <5.7 % of total Hgb   Mean Plasma Glucose 103 mg/dL   eAG (mmol/L) 5.7 mmol/L  Comprehensive metabolic panel  Result Value Ref Range   Glucose, Bld 95 65 - 99 mg/dL   BUN 15 7 - 25 mg/dL   Creat 0.86 0.70 - 1.33 mg/dL   BUN/Creatinine Ratio NOT  APPLICABLE 6 - 22 (calc)   Sodium 138 135 - 146 mmol/L   Potassium 4.2 3.5 - 5.3 mmol/L   Chloride 103 98 - 110 mmol/L   CO2 26 20 - 32 mmol/L   Calcium 9.3 8.6 -  10.3 mg/dL   Total Protein 6.4 6.1 - 8.1 g/dL   Albumin 4.2 3.6 - 5.1 g/dL   Globulin 2.2 1.9 - 3.7 g/dL (calc)   AG Ratio 1.9 1.0 - 2.5 (calc)   Total Bilirubin 0.8 0.2 - 1.2 mg/dL   Alkaline phosphatase (APISO) 79 35 - 144 U/L   AST 15 10 - 35 U/L   ALT 18 9 - 46 U/L      Assessment & Plan:   Problem List Items Addressed This Visit     Primary osteoarthritis of both knees - Primary   Relevant Medications   celecoxib (CELEBREX) 200 MG capsule   Chronic pain of both knees   Relevant Medications   celecoxib (CELEBREX) 200 MG capsule   Other Visit Diagnoses     Recurrent falls           Medical Conditions: - The primary medical condition that impacts patient's mobility need is his status of bilateral osteoarthritis of knees with chronic pain and recurrent falls. - Additional medical conditions contributing to his mobility, include osteoarthritis with degenerative disc disease of lumbar spine with back surgery history and prior history of meniscus tears in knees limiting his mobility.   MRADLs impaired in the home include: - Scooter POV is necessary for patient's mobility to get to the bathroom for routine toilet use. -  Scooter POV is necessary for patient's mobility to get to the kitchen to prepare meals - Scooter POV is necessary for patient's mobility to get to the bedroom to dress and sleep   Cane or Walker Patient cannot use a cane / walker due to his status of recurrent falls with unsteady knees bilaterally with history of osteoarthritis and history of torn meniscus. His lower extremity weakness is listed above under exam.   Manual Wheelchair Patient cannot use MWC due to some mild upper extremity weakness and chronic pain involving his back lumbar spine, this limits his ability to safely self propel manual wheelchair. Listed above his upper extremity weakness bilaterally is 4 out of 5.   Scooter (POV) Patient will benefit from use a POV for mobility. He has adequate  postural stability and POV would allow him to limit falls and prolong his mobility for ADLs and out of the house activities that are necessary such as grocery shopping and attending doctors appointments.   Patient can safely operate the power operated vehicle physically. He is mentally capable to operate the device.   Patient is willing and motivated to use the power operated vehicle in his home to improve his quality of life.   I have given the patient a handwritten Rx pad for Scooter POV device. He can submit to a Medical supply store DME of his choosing. We can also fax if needed in future. Once they review and he selects the equipment they may send [place] the order form and we can provide clinical documentation and sign off.   No orders of the defined types were placed in this encounter.     Follow up plan: Return if symptoms worsen or fail to improve.  Nobie Putnam, [facility] 07/18/2021, 11:05 AM

## 2021-08-26 ENCOUNTER — Encounter: Payer: Self-pay | Admitting: Family Medicine

## 2021-08-26 ENCOUNTER — Ambulatory Visit (INDEPENDENT_AMBULATORY_CARE_PROVIDER_SITE_OTHER): Payer: 59 | Admitting: Family Medicine

## 2021-08-26 VITALS — BP 143/93 | HR 82 | Ht 73.0 in | Wt 212.6 lb

## 2021-08-26 DIAGNOSIS — M25562 Pain in left knee: Secondary | ICD-10-CM

## 2021-08-26 DIAGNOSIS — M25561 Pain in right knee: Secondary | ICD-10-CM | POA: Diagnosis not present

## 2021-08-26 DIAGNOSIS — G8929 Other chronic pain: Secondary | ICD-10-CM

## 2021-08-26 DIAGNOSIS — M170 Bilateral primary osteoarthritis of knee: Secondary | ICD-10-CM | POA: Diagnosis not present

## 2021-08-26 DIAGNOSIS — R296 Repeated falls: Secondary | ICD-10-CM | POA: Diagnosis not present

## 2021-08-26 NOTE — Progress Notes (Signed)
Subjective:    Patient ID: Gary Schultz, male    DOB: [birth year], 56 y.o.   MRN: [id number]  Gary Schultz is a 56 y.o. 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 He had episodes of vertigo related No significant injuries He uses cane for ambulation at this time, which has not been sufficient  Pain Management Dr Metta Clines     Chief Complaint: MOBILITY EXAMINATION   1. Change to now require power mobility device (PMD)  Worsening recurrent falls due to advanced osteoarthritis with chronic knee pain. Impairing his mobility of daily activities. He has difficulty with pain and stiffness with standing from seated position and difficulty with ambulation unassisted. Has used cane before and he has fallen.      07/18/2021   11:26 AM 03/11/2021    1:20 PM 05/16/2020    1:32 PM  Depression screen PHQ 2/9  Decreased Interest 2 0 0  Down, Depressed, Hopeless 1 0 0  PHQ - 2 Score 3 0 0  Altered sleeping 2 0 0  Tired, decreased energy 1 0 1  Change in appetite 0 0 0  Feeling bad or failure about yourself  1 0 0  Trouble concentrating 1 2 0  Moving slowly or fidgety/restless 0 0 0  Suicidal thoughts 0 0 0  PHQ-9 Score 8 2 1   Difficult doing work/chores Not difficult at all Not difficult at all Not difficult at all    Social History   Tobacco Use   Smoking status: Never   Smokeless tobacco: Never  Vaping Use   Vaping Use: Never used  Substance Use Topics   Alcohol use: Yes    Alcohol/week: 3.0 standard drinks of alcohol    Types: 3 Glasses of wine per week    Comment: occassional   Drug use: No     Review of Systems Per HPI unless specifically indicated above     Objective:    BP (!) 143/93   Pulse 82   Ht 6\' 1"  (1.854 m)   Wt 212 lb 9.6 oz (96.4 kg)   SpO2 98%   BMI 28.05 kg/m   Wt Readings from Last 3 Encounters:  08/26/21 212 lb 9.6 oz (96.4 kg)  07/18/21 210 lb 6.4 oz (95.4 kg)  03/11/21 208 lb 9.6 oz (94.6 kg)    Physical Exam Vitals and nursing note reviewed.  Constitutional:      General: He is not in acute distress.    Appearance: Normal appearance. He is well-developed. He is not diaphoretic.     Comments: Well-appearing, comfortable, cooperative  HENT:     Head: Normocephalic and atraumatic.  Eyes:     General:        Right eye: No discharge.        Left eye: No discharge.     Conjunctiva/sclera: Conjunctivae normal.  Cardiovascular:     Rate and Rhythm: Normal rate.  Pulmonary:     Effort: Pulmonary effort is normal.  Musculoskeletal:     Comments: Bilateral knee bulkiness, crepitus, stiffness on ROM R>L  Skin:    General: Skin is warm  and dry.     Findings: No erythema or rash.  Neurological:     Mental Status: He is alert and oriented to person, place, and time.  Psychiatric:        Mood and Affect: Mood normal.        Behavior: Behavior normal.        Thought Content: Thought content normal.     Comments: Well groomed, good eye contact, normal speech and thoughts     2. Height = 6\' 1" , Weight = 212 lbs   3. -  O2 Saturation = 99% room air - Edema = positive for some edema on Right knee and Left lower extremity - Pressure Sores = None actively - Ability to Shift Weight = Able to shift weight and stand up from seated   Upper and Lower Extremity Assessment RUE - Strength - 4 out of 5 - Pain - 3 out of 10 - Range of Motion - Reduced ROM right shoulder   LUE - Strength - 4 out of 5 - Pain - 2 out of 10 - Range of Motion - mostly full range   RLE - Strength 3 out of 5 - Pain - 6 out of 10 - Range of Motion - reduced range of  motion with knee flexion and extension, stiffness   LLE - Strength - 4 out of 5 - Pain - 6 out of 10 - Range of Motion - reduced range of motion with knee flexion and extension   - Gait Pattern: Able to stand unassisted, unsteady initially on feet, stiffness with knees limiting his gait.  Results for orders placed or performed in visit on 05/11/20  Lipid panel  Result Value Ref Range   Cholesterol 193 <200 mg/dL   HDL 61 > OR = 40 mg/dL   Triglycerides 73 05/13/20 mg/dL   LDL Cholesterol (Calc) 115 (H) mg/dL (calc)   Total CHOL/HDL Ratio 3.2 <5.0 (calc)   Non-HDL Cholesterol (Calc) 132 (H) <130 mg/dL (calc)  CBC with Differential/Platelet  Result Value Ref Range   WBC 5.8 3.8 - 10.8 Thousand/uL   RBC 4.72 4.20 - 5.80 Million/uL   Hemoglobin 14.2 13.2 - 17.1 g/dL   HCT <532 99.2 - 42.6 %   MCV 89.2 80.0 - 100.0 fL   MCH 30.1 27.0 - 33.0 pg   MCHC 33.7 32.0 - 36.0 g/dL   RDW 83.4 19.6 - 22.2 %   Platelets 166 140 - 400 Thousand/uL   MPV 11.2 7.5 - 12.5 fL   Neutro Abs 3,445 1,500 - 7,800 cells/uL   Lymphs Abs 1,560 850 - 3,900 cells/uL   Absolute Monocytes 673 200 - 950 cells/uL   Eosinophils Absolute 93 15 - 500 cells/uL   Basophils Absolute 29 0 - 200 cells/uL   Neutrophils Relative % 59.4 %   Total Lymphocyte 26.9 %   Monocytes Relative 11.6 %   Eosinophils Relative 1.6 %   Basophils Relative 0.5 %  PSA  Result Value Ref Range   PSA 0.45 < OR = 4.0 ng/mL  Hemoglobin A1c  Result Value Ref Range   Hgb A1c MFr Bld 5.2 <5.7 % of total Hgb   Mean Plasma Glucose 103 mg/dL   eAG (mmol/L) 5.7 mmol/L  Comprehensive metabolic panel  Result Value Ref Range   Glucose, Bld 95 65 - 99 mg/dL   BUN 15 7 - 25 mg/dL   Creat 97.9 8.92 - 1.19 mg/dL   BUN/Creatinine Ratio NOT APPLICABLE 6 - 22 (  calc)   Sodium 138 135 - 146 mmol/L   Potassium 4.2 3.5 - 5.3 mmol/L   Chloride 103 98 - 110 mmol/L   CO2 26 20 - 32 mmol/L   Calcium 9.3 8.6 - 10.3 mg/dL   Total Protein 6.4 6.1 - 8.1 g/dL    Albumin 4.2 3.6 - 5.1 g/dL   Globulin 2.2 1.9 - 3.7 g/dL (calc)   AG Ratio 1.9 1.0 - 2.5 (calc)   Total Bilirubin 0.8 0.2 - 1.2 mg/dL   Alkaline phosphatase (APISO) 79 35 - 144 U/L   AST 15 10 - 35 U/L   ALT 18 9 - 46 U/L      Assessment & Plan:   Problem List Items Addressed This Visit     Primary osteoarthritis of both knees - Primary   Relevant Orders   DME Wheelchair electric   Chronic pain of both knees   Relevant Orders   DME Wheelchair electric   Other Visit Diagnoses     Recurrent falls       Relevant Orders   DME Wheelchair electric       Medical Conditions: - The primary medical condition that impacts patient's mobility need is his status of bilateral osteoarthritis of knees with chronic pain and recurrent falls. - Additional medical conditions contributing to his mobility, include osteoarthritis with degenerative disc disease of lumbar spine with back surgery history and prior history of meniscus tears in knees limiting his mobility.   MRADLs impaired in the home include: - PMD is necessary for patient's mobility to get to the bathroom for routine toilet use. - PMD is necessary for patient's mobility to get to the kitchen to prepare meals - PMD is necessary for patient's mobility to get to the bedroom to dress and sleep   Cane or Walker Patient cannot use a cane / walker due to his status of recurrent falls with unsteady knees bilaterally with history of osteoarthritis and history of torn meniscus. His lower extremity weakness is listed above under exam.   Manual Wheelchair Patient cannot use MWC due to some mild upper extremity weakness and chronic pain involving his back lumbar spine, this limits his ability to safely self propel manual wheelchair. Listed above his upper extremity weakness bilaterally is 4 out of 5.   Scooter (POV) Patient cannot use a POV due to lack of postural stability, with generalized weakness in upper and lower extremities.    Patient can safely operate the power mobility device physically. He is mentally capable to operate the device.   Patient is willing and motivated to use the power mobility device in his home to improve his quality of life.  Patient will benefit from use a PMD for mobility. This would allow him to limit falls and prolong his mobility for ADLs and out of the house activities that are necessary such as grocery shopping and attending doctors appointments.   Patient can safely operate the power operated vehicle physically. He is mentally capable to operate the device.   Patient is willing and motivated to use the power operated vehicle in his home to improve his quality of life.    We have contacted [facility] and will fax new orders for Electric Wheelchair to them with notes for approval.   No orders of the defined types were placed in this encounter.     Follow up plan: Return if symptoms worsen or fail to improve.   Saralyn Pilar, DO [facility] 08/26/2021, 1:33 PM

## 2021-08-26 NOTE — Patient Instructions (Addendum)
Thank you for coming to the office today.  You do meet criteria for pursuing the Passenger transport manager (Power Mobility Device) now  We will call the medical supply store and we will request a copy of the paperwork for all of the orders.  [facility]) [address] [place], [place] [address] Phone: [phone]  They should send the order forms to [place] so we can complete them and fax them back.  Once they receive the completed orders, they should contact you to discuss next steps in ordering.  Please schedule a Follow-up Appointment to: Return if symptoms worsen or fail to improve.  If you have any other questions or concerns, please feel free to call the office or send a message through MyChart. You may also schedule an earlier appointment if necessary.  Additionally, you may be receiving a survey about your experience at our office within a few days to 1 week by e-mail or mail. We value your feedback.  Saralyn Pilar, DO [facility], [place]

## 2021-09-09 ENCOUNTER — Ambulatory Visit: Payer: Self-pay | Admitting: *Deleted

## 2021-09-09 NOTE — Telephone Encounter (Signed)
  Chief Complaint: back and abdominal pain Symptoms: left low back pain, low to middle abdominal pain. Low abdomen tender to touch at times. C/o pain comes and goes. Stabbing at times. Not sure if picked up heavy object. Frequency: few days ago  Pertinent Negatives: Patient denies chest pain no difficulty breathing no fever. No pain down legs. No pain in groin area or issue with urinating  Disposition: [] ED /[] Urgent Care (no appt availability in office) / [x] Appointment(In office/virtual)/ []  [place] [facility]/ [] Home Care/ [] Refused Recommended Disposition /[] [place] [facility]/ []  Follow-up with PCP Additional Notes:   Reports he will not eat or drink after midnight in case PCP orders lab work.   Reason for Disposition  [1] MODERATE back pain (e.g., interferes with normal activities) AND [2] present > 3 days  Answer Assessment - Initial Assessment Questions 1. ONSET: "When did the pain begin?"      Few days ago  2. LOCATION: "Where does it hurt?" (upper, mid or lower back)     Left low back, stabbing pain and middle of stomach  3. SEVERITY: "How bad is the pain?"  (e.g., Scale 1-10; mild, moderate, or severe)   - MILD (1-3): Doesn't interfere with normal activities.    - MODERATE (4-7): Interferes with normal activities or awakens from sleep.    - SEVERE (8-10): Excruciating pain, unable to do any normal activities.      Left low back pain stabbing, low to middle abdominal pain  4. PATTERN: "Is the pain constant?" (e.g., yes, no; constant, intermittent)      Comes and goes  5. RADIATION: "Does the pain shoot into your legs or somewhere else?"     na 6. CAUSE:  "What do you think is causing the back pain?"      Might have picked up something too heavy 7. BACK OVERUSE:  "Any recent lifting of heavy objects, strenuous work or exercise?"     Possible picking up heavy obfect  8. MEDICINES: "What have you taken so far for the pain?" (e.g., nothing, acetaminophen, NSAIDS)     Prescribed  Pain medications 9. NEUROLOGIC SYMPTOMS: "Do you have any weakness, numbness, or problems with bowel/bladder control?"    Weakness at times  10. OTHER SYMPTOMS: "Do you have any other symptoms?" (e.g., fever, abdomen pain, burning with urination, blood in urine)       Dizziness , headaches  11. PREGNANCY: "Is there any chance you are pregnant?" "When was your last menstrual period?"       na  Protocols used: Back Pain-A-AH

## 2021-09-10 ENCOUNTER — Ambulatory Visit: Payer: 59 | Admitting: Family Medicine

## 2021-09-16 ENCOUNTER — Ambulatory Visit: Payer: 59 | Admitting: Family Medicine

## 2021-09-19 ENCOUNTER — Ambulatory Visit: Payer: 59 | Admitting: Family Medicine

## 2021-10-23 ENCOUNTER — Ambulatory Visit (INDEPENDENT_AMBULATORY_CARE_PROVIDER_SITE_OTHER): Payer: 59 | Admitting: Family Medicine

## 2021-10-23 VITALS — BP 132/79 | HR 78 | Ht 73.0 in | Wt 211.0 lb

## 2021-10-23 DIAGNOSIS — M159 Polyosteoarthritis, unspecified: Secondary | ICD-10-CM

## 2021-10-23 DIAGNOSIS — F411 Generalized anxiety disorder: Secondary | ICD-10-CM

## 2021-10-23 DIAGNOSIS — F900 Attention-deficit hyperactivity disorder, predominantly inattentive type: Secondary | ICD-10-CM | POA: Diagnosis not present

## 2021-10-23 DIAGNOSIS — F4310 Post-traumatic stress disorder, unspecified: Secondary | ICD-10-CM | POA: Diagnosis not present

## 2021-10-23 DIAGNOSIS — Z1283 Encounter for screening for malignant neoplasm of skin: Secondary | ICD-10-CM

## 2021-10-23 DIAGNOSIS — M170 Bilateral primary osteoarthritis of knee: Secondary | ICD-10-CM

## 2021-10-23 NOTE — Patient Instructions (Addendum)
Thank you for coming to the office today.   Referral to Dermatology  [facility]   [address] [place], [place] [address] Hours: 8AM-5PM Phone: [phone]  -------------------------  We will contact [employer] for the electric wheelchair  [facility] [address] [place], [place] [address] Ph: [phone]  These offices have both PSYCHIATRY doctors and THERAPISTS  [facility] (Virtual Available) [place] [address] [place], [place] [address] Phone: [phone]  [facility] Address: [address], [place], [place] [address] [email or web address] Phone: [phone]  [facility]) Address: [address], [place], [place] [address] Hours: 8:30AM-5PM Phone: [phone]  [facility] (Adult, Peds, Geriatric, Counseling) [address] [place], [place] [address] Phone: [phone] Fax: [phone]  [facility] [address] [place], [place] [address] Phone: [phone]  [facility] (All ages) [address] [place]vin Knack [place], [id number] Phone: [phone] (Option 1) [email or web address]  ----------------------------------------------------------------- THERAPIST ONLY  (No Psychiatry)  [facility] [address] [place], [place] [address] [place] P: [phone]  [facility], [facility] [address] [place], [place] [address] [phone]  [facility]   Address: [address] [place], [place], [place] [address] Hours: Open today  9AM-7PM Phone: [phone]  [facility] [address], [facility] Address: [address], [place], [place] [address] Phone: [phone]  DUE for FASTING BLOOD WORK (no food or drink  after midnight before the lab appointment, only water or coffee without cream/sugar on the morning of)  SCHEDULE "Lab Only" visit in the morning at the clinic for lab draw in 4-6 WEEKS   - Make sure Lab Only appointment is at about 1 week before your next appointment, so that results will be available  For Lab Results, once available within 2-3 days of blood draw, you can can log in to MyChart online to view your results and a brief explanation. Also, we can discuss results at next follow-up visit.    Please schedule a Follow-up Appointment to: Return in about 6 weeks (around 12/04/2021) for 6 weeks fasting lab only then 1 week later Annual Physical.  If you have any other questions or concerns, please feel free to call the office or send a message through MyChart. You may also schedule an earlier appointment if necessary.  Additionally, you may be receiving a survey about your experience at our office within a few days to 1 week by e-mail or mail. We value your feedback.  Saralyn Pilar, DO [facility], [place]

## 2021-10-23 NOTE — Progress Notes (Signed)
Subjective:    Patient ID: Gary Schultz, male    DOB: Nov 13, 1965, 56 y.o.   MRN: [id number]  Gary Schultz is a 56 y.o. male presenting on 10/23/2021 for Knee Pain and Fatigue   HPI  Disability Provider seen recently Given dx PTSD, GAD, ADHD, Memory Loss They recommended Therapist  Pain Management Provider Osteoarthritis multiple joint pains  Past history of R shoulder surgery He has knot in R shoulder  He is asking about Electric Wheelchair order status, we saw him 6/1, evaluation done for mobility. Waiting on [employer] [facility] to fax [place] order form.  Multiple moles and requesting Dermatology screening     10/23/2021   11:10 AM 07/18/2021   11:26 AM 03/11/2021    1:20 PM  Depression screen PHQ 2/9  Decreased Interest 0 2 0  Down, Depressed, Hopeless 2 1 0  PHQ - 2 Score 2 3 0  Altered sleeping 2 2 0  Tired, decreased energy 2 1 0  Change in appetite 2 0 0  Feeling bad or failure about yourself   1 0  Trouble concentrating 2 1 2   Moving slowly or fidgety/restless 1 0 0  Suicidal thoughts 0 0 0  PHQ-9 Score 11 8 2   Difficult doing work/chores Extremely dIfficult Not difficult at all Not difficult at all      10/23/2021   11:11 AM 07/18/2021   11:27 AM 03/11/2021    1:21 PM 05/16/2020    1:34 PM  GAD 7 : Generalized Anxiety Score  Nervous, Anxious, on Edge 2 0 0 0  Control/stop worrying 1 0 0 0  Worry too much - different things 1 0 0 0  Trouble relaxing 2 1 0 0  Restless 2 1 0 0  Easily annoyed or irritable 3 1 0 0  Afraid - awful might happen 2 1 0 0  Total GAD 7 Score 13 4 0 0  Anxiety Difficulty Not difficult at all Not difficult at all Not difficult at all Not difficult at all      Social History   Tobacco Use   Smoking status: Never   Smokeless tobacco: Never  Vaping Use   Vaping Use: Never used  Substance Use Topics   Alcohol use: Yes    Alcohol/week: 3.0 standard drinks of alcohol    Types: 3 Glasses of wine per week    Comment: occassional    Drug use: No    Review of Systems Per HPI unless specifically indicated above     Objective:    BP 132/79   Pulse 78   Ht 6\' 1"  (1.854 m)   Wt 211 lb (95.7 kg)   SpO2 98%   BMI 27.84 kg/m   Wt Readings from Last 3 Encounters:  10/23/21 211 lb (95.7 kg)  08/26/21 212 lb 9.6 oz (96.4 kg)  07/18/21 210 lb 6.4 oz (95.4 kg)    Physical Exam Vitals and nursing note reviewed.  Constitutional:      General: He is not in acute distress.    Appearance: Normal appearance. He is well-developed. He is not diaphoretic.     Comments: Well-appearing, comfortable, cooperative  HENT:     Head: Normocephalic and atraumatic.  Eyes:     General:        Right eye: No discharge.        Left eye: No discharge.     Conjunctiva/sclera: Conjunctivae normal.  Cardiovascular:     Rate and Rhythm: Normal rate.  Pulmonary:     Effort: Pulmonary effort is normal.  Musculoskeletal:     Comments: Bilateral knee bulkiness, crepitus, stiffness on ROM R>L  Skin:    General: Skin is warm and dry.     Findings: No erythema or rash.  Neurological:     Mental Status: He is alert and oriented to person, place, and time.  Psychiatric:        Mood and Affect: Mood normal.        Behavior: Behavior normal.        Thought Content: Thought content normal.     Comments: Well groomed, good eye contact, normal speech and thoughts    Results for orders placed or performed in visit on 05/11/20  Lipid panel  Result Value Ref Range   Cholesterol 193 <200 mg/dL   HDL 61 > OR = 40 mg/dL   Triglycerides 73 <409 mg/dL   LDL Cholesterol (Calc) 115 (H) mg/dL (calc)   Total CHOL/HDL Ratio 3.2 <5.0 (calc)   Non-HDL Cholesterol (Calc) 132 (H) <130 mg/dL (calc)  CBC with Differential/Platelet  Result Value Ref Range   WBC 5.8 3.8 - 10.8 Thousand/uL   RBC 4.72 4.20 - 5.80 Million/uL   Hemoglobin 14.2 13.2 - 17.1 g/dL   HCT 73.5 32.9 - 92.4 %   MCV 89.2 80.0 - 100.0 fL   MCH 30.1 27.0 - 33.0 pg   MCHC 33.7 32.0 -  36.0 g/dL   RDW 26.8 34.1 - 96.2 %   Platelets 166 140 - 400 Thousand/uL   MPV 11.2 7.5 - 12.5 fL   Neutro Abs 3,445 1,500 - 7,800 cells/uL   Lymphs Abs 1,560 850 - 3,900 cells/uL   Absolute Monocytes 673 200 - 950 cells/uL   Eosinophils Absolute 93 15 - 500 cells/uL   Basophils Absolute 29 0 - 200 cells/uL   Neutrophils Relative % 59.4 %   Total Lymphocyte 26.9 %   Monocytes Relative 11.6 %   Eosinophils Relative 1.6 %   Basophils Relative 0.5 %  PSA  Result Value Ref Range   PSA 0.45 < OR = 4.0 ng/mL  Hemoglobin A1c  Result Value Ref Range   Hgb A1c MFr Bld 5.2 <5.7 % of total Hgb   Mean Plasma Glucose 103 mg/dL   eAG (mmol/L) 5.7 mmol/L  Comprehensive metabolic panel  Result Value Ref Range   Glucose, Bld 95 65 - 99 mg/dL   BUN 15 7 - 25 mg/dL   Creat 2.29 7.98 - 9.21 mg/dL   BUN/Creatinine Ratio NOT APPLICABLE 6 - 22 (calc)   Sodium 138 135 - 146 mmol/L   Potassium 4.2 3.5 - 5.3 mmol/L   Chloride 103 98 - 110 mmol/L   CO2 26 20 - 32 mmol/L   Calcium 9.3 8.6 - 10.3 mg/dL   Total Protein 6.4 6.1 - 8.1 g/dL   Albumin 4.2 3.6 - 5.1 g/dL   Globulin 2.2 1.9 - 3.7 g/dL (calc)   AG Ratio 1.9 1.0 - 2.5 (calc)   Total Bilirubin 0.8 0.2 - 1.2 mg/dL   Alkaline phosphatase (APISO) 79 35 - 144 U/L   AST 15 10 - 35 U/L   ALT 18 9 - 46 U/L      Assessment & Plan:   Problem List Items Addressed This Visit     GAD (generalized anxiety disorder)   Primary osteoarthritis of both knees   Other Visit Diagnoses     PTSD (post-traumatic stress disorder)    -  Primary   Attention deficit hyperactivity disorder (ADHD), predominantly inattentive type       Primary osteoarthritis involving multiple joints       Skin cancer screening       Relevant Orders   Ambulatory referral to Dermatology       PTSD ADHD GAD Mental health contributing to disability Will recommend therapy options, AVS  Recent Mobility Examination 07/18/21 for electric wheelchair - awaiting order forms from  [employer] via fax, they were contacted today.  referral to Dermatology for routine skin cancer surveillance evaluation, multiple moles, without history but has sun exposure.   Orders Placed This Encounter  Procedures   Ambulatory referral to Dermatology    Referral Priority:   Routine    Referral Type:   Consultation    Referral Reason:   Specialty Services Required    Requested Specialty:   Dermatology    Number of Visits Requested:   1     No orders of the defined types were placed in this encounter.     Follow up plan: Return in about 6 weeks (around 12/04/2021) for 6 weeks fasting lab only then 1 week later Annual Physical.  Saralyn Pilar, DO [facility] 10/23/2021, 11:36 AM

## 2021-10-24 ENCOUNTER — Telehealth: Payer: Self-pay | Admitting: Family Medicine

## 2021-10-24 DIAGNOSIS — G894 Chronic pain syndrome: Secondary | ICD-10-CM

## 2021-10-24 DIAGNOSIS — M47812 Spondylosis without myelopathy or radiculopathy, cervical region: Secondary | ICD-10-CM

## 2021-10-24 DIAGNOSIS — M5136 Other intervertebral disc degeneration, lumbar region: Secondary | ICD-10-CM

## 2021-10-24 DIAGNOSIS — M47816 Spondylosis without myelopathy or radiculopathy, lumbar region: Secondary | ICD-10-CM

## 2021-10-24 DIAGNOSIS — M170 Bilateral primary osteoarthritis of knee: Secondary | ICD-10-CM

## 2021-10-24 DIAGNOSIS — G8929 Other chronic pain: Secondary | ICD-10-CM

## 2021-10-24 DIAGNOSIS — F0781 Postconcussional syndrome: Secondary | ICD-10-CM

## 2021-10-24 NOTE — Telephone Encounter (Signed)
Referral Request - Has patient seen PCP for this complaint? yes *If NO, is insurance requiring patient see PCP for this issue before PCP can refer them? Referral for which specialty:pain management Preferred provider/office: Patient has been seeing Dr Ewing Schlein and he is retiring so he needs another referral Reason for referral: ongoing pain all over

## 2021-10-28 NOTE — Telephone Encounter (Signed)
Referral has been ordered.  To [facility] [place]  Saralyn Pilar, DO [facility] 10/28/2021, 6:20 PM

## 2021-12-04 ENCOUNTER — Ambulatory Visit (INDEPENDENT_AMBULATORY_CARE_PROVIDER_SITE_OTHER): Payer: 59 | Admitting: Family Medicine

## 2021-12-04 VITALS — BP 138/82 | HR 66 | Ht 73.0 in | Wt 207.0 lb

## 2021-12-04 DIAGNOSIS — N401 Enlarged prostate with lower urinary tract symptoms: Secondary | ICD-10-CM | POA: Diagnosis not present

## 2021-12-04 DIAGNOSIS — Z0000 Encounter for general adult medical examination without abnormal findings: Secondary | ICD-10-CM | POA: Diagnosis not present

## 2021-12-04 DIAGNOSIS — G8929 Other chronic pain: Secondary | ICD-10-CM

## 2021-12-04 DIAGNOSIS — R351 Nocturia: Secondary | ICD-10-CM

## 2021-12-04 DIAGNOSIS — F4310 Post-traumatic stress disorder, unspecified: Secondary | ICD-10-CM

## 2021-12-04 DIAGNOSIS — M25561 Pain in right knee: Secondary | ICD-10-CM | POA: Diagnosis not present

## 2021-12-04 DIAGNOSIS — R7309 Other abnormal glucose: Secondary | ICD-10-CM

## 2021-12-04 DIAGNOSIS — I10 Essential (primary) hypertension: Secondary | ICD-10-CM | POA: Diagnosis not present

## 2021-12-04 DIAGNOSIS — J0110 Acute frontal sinusitis, unspecified: Secondary | ICD-10-CM

## 2021-12-04 DIAGNOSIS — M25562 Pain in left knee: Secondary | ICD-10-CM

## 2021-12-04 DIAGNOSIS — E7800 Pure hypercholesterolemia, unspecified: Secondary | ICD-10-CM

## 2021-12-04 MED ORDER — FLUTICASONE PROPIONATE 50 MCG/ACT NA SUSP: 2.0000 | Freq: Every day | NASAL | 3 refills | Status: DC

## 2021-12-04 MED ORDER — AMOXICILLIN-POT CLAVULANATE 875-125 MG PO TABS: 1.0000 | ORAL_TABLET | Freq: Two times a day (BID) | ORAL | 0 refills | Status: DC

## 2021-12-04 NOTE — Patient Instructions (Addendum)
Thank you for coming to the office today.  Last visit with Gary Schultz at [facility] was 08/26/21 - you received Bilateral one in each knee, Steroid Injection. - Unsuccessful, only worked for 5 days, then pain returned.  I would suggest that you call back to [facility], now that deductible may be met (after getting the wheelchair), as they tried to get the visco injections before, but unfortunately back in May 2023 the cost was high due to deductible  If they can do it, see if they can order and arrange - the visco injections, you may be responsible only for a copayment. - Find out from them - the copayment cost (after deductible)  If the cost is still out of reach, if they can repeat steroid injections or offer something else - that would be the next plan. - Ask about the other alternative options from [facility]  If they cannot get you in quickly. And they give me permission to do a steroid shot, I am happy to do it here.  Labs ordered. STay tuned for results.  For Sinuses, ordered medicine today.   Please schedule a Follow-up Appointment to: Return if symptoms worsen or fail to improve.  If you have any other questions or concerns, please feel free to call the office or send a message through [place]. You may also schedule an earlier appointment if necessary.  Additionally, you may be receiving a survey about your experience at our office within a few days to 1 week by e-mail or mail. We value your feedback.  Nobie Putnam, DO [place]

## 2021-12-04 NOTE — Progress Notes (Unsigned)
Subjective:    Patient ID: Gary Schultz, male    DOB: January 10, 1966, 56 y.o.   MRN: [id number]  Gary Schultz is a 56 y.o. male presenting on 12/04/2021 for Annual Exam and Knee Pain   HPI  Here for Annual Physical and Lab Orders  Advanced Osteoarthritis Bilateral knees History of Meniscus Tear  [place] seen 08/26/21 had bilateral steroid injections, lasted for 3 days only.  Now asking about further management of knee pain arthritis, and considering viscosupplement knee injections again, previously did not meet deductible  Previous Pain Management Dr Mohammed Kindle ***  Admits difficulty with walking / turning twisting with meniscus     Health Maintenance: ***     10/23/2021   11:10 AM 07/18/2021   11:26 AM 03/11/2021    1:20 PM  Depression screen PHQ 2/9  Decreased Interest 0 2 0  Down, Depressed, Hopeless 2 1 0  PHQ - 2 Score 2 3 0  Altered sleeping 2 2 0  Tired, decreased energy 2 1 0  Change in appetite 2 0 0  Feeling bad or failure about yourself   1 0  Trouble concentrating 2 1 2   Moving slowly or fidgety/restless 1 0 0  Suicidal thoughts 0 0 0  PHQ-9 Score 11 8 2   Difficult doing work/chores Extremely dIfficult Not difficult at all Not difficult at all    Past Medical History:  Diagnosis Date   Anxiety    Carpal tunnel syndrome    bilateral   DDD (degenerative disc disease), lumbar    Generalized headaches    from neck injury   GERD (gastroesophageal reflux disease)    Hypertension    Neck pain    Neuromuscular disorder ([place])    Varicose veins    Past Surgical History:  Procedure Laterality Date   BACK SURGERY  2006   L4-L5 herniated disc repair   CARPAL TUNNEL RELEASE Right 12/22/2019   Procedure: CARPAL TUNNEL RELEASE ENDOSCOPIC;  Surgeon: Corky Mull, MD;  Location: [facility];  Service: Orthopedics;  Laterality: Right;   CARPAL TUNNEL RELEASE Left 02/29/2020   Procedure: CARPAL TUNNEL RELEASE ENDOSCOPIC;   Surgeon: Corky Mull, MD;  Location: [facility];  Service: Orthopedics;  Laterality: Left;   CLAVICLE SURGERY Right    COLONOSCOPY WITH PROPOFOL N/A 06/08/2020   Procedure: COLONOSCOPY WITH PROPOFOL;  Surgeon: [name], MD;  Location: [facility];  Service: Endoscopy;  Laterality: N/A;   FRACTURE SURGERY     KNEE ARTHROSCOPY WITH MEDIAL MENISECTOMY Left 09/28/2018   Procedure: KNEE ARTHROSCOPY WITH DEBRIDEMENT, abrasion chondroplasty of patella, PARTIAL MEDIAL MENISCECTOMY;  Surgeon: Corky Mull, MD;  Location: [facility];  Service: Orthopedics;  Laterality: Left;   KNEE ARTHROSCOPY WITH MEDIAL MENISECTOMY Right 12/07/2018   Procedure: RIGHT KNEE ARTHROSCOPY WITH DEBRIDEMENT, REPAIR, PARTIAL MEDIAL MENISECTOMY;  Surgeon: Corky Mull, MD;  Location: [facility];  Service: Orthopedics;  Laterality: Right;   REPAIR OF PERONEUS BREVIS TENDON Left 03/29/2019   Procedure: Debridement of chronic peroneus brevis tendinopathy with reconstruction of calcaneofibular and anterior talofibular ligaments;  Surgeon: Corky Mull, MD;  Location: [facility];  Service: Orthopedics;  Laterality: Left;   SHOULDER SURGERY Right 1992   Social History   Socioeconomic History   Marital status: Married    Spouse name: [name]   Number of children: Not on file   Years of education: Not on file   Highest education level: Not on file  Occupational History  Not on file  Tobacco Use   Smoking status: Never   Smokeless tobacco: Never  Vaping Use   Vaping Use: Never used  Substance and Sexual Activity   Alcohol use: Yes    Alcohol/week: 3.0 standard drinks of alcohol    Types: 3 Glasses of wine per week    Comment: occassional   Drug use: No   Sexual activity: Yes  Other Topics Concern   Not on file  Social History Narrative   Not on file   Social Determinants of Health   Financial Resource Strain: Not on file  Food Insecurity: Not on file  Transportation Needs: Not on file  Physical Activity: Not  on file  Stress: Not on file  Social Connections: Not on file  Intimate Partner Violence: Not on file   Family History  Problem Relation Age of Onset   Cancer Mother    Varicose Veins Mother    Cancer Father    Stroke Father    Hypertension Father    Colon cancer Father    Current Outpatient Medications on File Prior to Visit  Medication Sig   celecoxib (CELEBREX) 200 MG capsule Take 200 mg by mouth 2 (two) times daily.   diclofenac Sodium (VOLTAREN) 1 % GEL SMARTSIG:2-4 Gram(s) Topical 2-4 Times Daily   gabapentin (NEURONTIN) 300 MG capsule Take 300-900 mg by mouth 3 (three) times daily as needed (pain).   hyoscyamine (LEVSIN) 0.125 MG tablet Take by mouth every 6 (six) hours as needed.   lidocaine (LIDODERM) 5 % Place 1-2 patches onto the skin daily as needed (back pain).   lisinopril (ZESTRIL) 30 MG tablet Take 1 tablet (30 mg total) by mouth daily.   naloxone (NARCAN) nasal spray 4 mg/0.1 mL SMARTSIG:Both Nares   omeprazole (PRILOSEC) 20 MG capsule Take 1 capsule (20 mg total) by mouth daily before breakfast. Extra refills on file   Oxycodone HCl 20 MG TABS Take 20 mg by mouth 5 (five) times daily as needed (pain).   tadalafil (CIALIS) 20 MG tablet Take 0.5-1 tablets (10-20 mg total) by mouth every other day as needed for erectile dysfunction.   No current facility-administered medications on file prior to visit.    Review of Systems Per HPI unless specifically indicated above  ***New Patient ***Level 3 - HPI 4, ROS 2, PFSH 1 - Low MDM - 30 min ***Level 4 - HPI 4, ROS 10***, PFSH 3, Moderate MDM - 45 min     Objective:    BP (!) 152/91   Pulse 66   Ht 6\' 1"  (1.854 m)   Wt 207 lb (93.9 kg)   SpO2 98%   BMI 27.31 kg/m   Wt Readings from Last 3 Encounters:  12/04/21 207 lb (93.9 kg)  10/23/21 211 lb (95.7 kg)  08/26/21 212 lb 9.6 oz (96.4 kg)    Physical Exam Results for orders placed or performed in visit on 05/11/20  Lipid panel  Result Value Ref Range    Cholesterol 193 <200 mg/dL   HDL 61 > OR = 40 mg/dL   Triglycerides 73 <150 mg/dL   LDL Cholesterol (Calc) 115 (H) mg/dL (calc)   Total CHOL/HDL Ratio 3.2 <5.0 (calc)   Non-HDL Cholesterol (Calc) 132 (H) <130 mg/dL (calc)  CBC with Differential/Platelet  Result Value Ref Range   WBC 5.8 3.8 - 10.8 Thousand/uL   RBC 4.72 4.20 - 5.80 Million/uL   Hemoglobin 14.2 13.2 - 17.1 g/dL   HCT 42.1 38.5 - 50.0 %  MCV 89.2 80.0 - 100.0 fL   MCH 30.1 27.0 - 33.0 pg   MCHC 33.7 32.0 - 36.0 g/dL   RDW 88.4 16.6 - 06.3 %   Platelets 166 140 - 400 Thousand/uL   MPV 11.2 7.5 - 12.5 fL   Neutro Abs 3,445 1,500 - 7,800 cells/uL   Lymphs Abs 1,560 850 - 3,900 cells/uL   Absolute Monocytes 673 200 - 950 cells/uL   Eosinophils Absolute 93 15 - 500 cells/uL   Basophils Absolute 29 0 - 200 cells/uL   Neutrophils Relative % 59.4 %   Total Lymphocyte 26.9 %   Monocytes Relative 11.6 %   Eosinophils Relative 1.6 %   Basophils Relative 0.5 %  PSA  Result Value Ref Range   PSA 0.45 < OR = 4.0 ng/mL  Hemoglobin A1c  Result Value Ref Range   Hgb A1c MFr Bld 5.2 <5.7 % of total Hgb   Mean Plasma Glucose 103 mg/dL   eAG (mmol/L) 5.7 mmol/L  Comprehensive metabolic panel  Result Value Ref Range   Glucose, Bld 95 65 - 99 mg/dL   BUN 15 7 - 25 mg/dL   Creat 0.16 0.10 - 9.32 mg/dL   BUN/Creatinine Ratio NOT APPLICABLE 6 - 22 (calc)   Sodium 138 135 - 146 mmol/L   Potassium 4.2 3.5 - 5.3 mmol/L   Chloride 103 98 - 110 mmol/L   CO2 26 20 - 32 mmol/L   Calcium 9.3 8.6 - 10.3 mg/dL   Total Protein 6.4 6.1 - 8.1 g/dL   Albumin 4.2 3.6 - 5.1 g/dL   Globulin 2.2 1.9 - 3.7 g/dL (calc)   AG Ratio 1.9 1.0 - 2.5 (calc)   Total Bilirubin 0.8 0.2 - 1.2 mg/dL   Alkaline phosphatase (APISO) 79 35 - 144 U/L   AST 15 10 - 35 U/L   ALT 18 9 - 46 U/L      Assessment & Plan:   Problem List Items Addressed This Visit     BPH associated with nocturia   Relevant Orders   PSA   Chronic pain of both knees    Other Visit Diagnoses     Annual physical exam    -  Primary   Relevant Orders   COMPLETE METABOLIC PANEL WITH GFR   CBC with Differential/Platelet   Lipid panel   Hemoglobin A1c   PSA   TSH   Essential hypertension       Relevant Orders   COMPLETE METABOLIC PANEL WITH GFR   CBC with Differential/Platelet   Elevated LDL cholesterol level       Relevant Orders   COMPLETE METABOLIC PANEL WITH GFR   Lipid panel   TSH   PTSD (post-traumatic stress disorder)       Relevant Orders   COMPLETE METABOLIC PANEL WITH GFR   Abnormal glucose       Relevant Orders   Hemoglobin A1c   Acute non-recurrent frontal sinusitis       Relevant Medications   fluticasone (FLONASE) 50 MCG/ACT nasal spray   amoxicillin-clavulanate (AUGMENTIN) 875-125 MG tablet       Updated Health Maintenance information Fasting labs ordered today Encouraged improvement to lifestyle with diet and exercise Goal of weight loss  Orders Placed This Encounter  Procedures   COMPLETE METABOLIC PANEL WITH GFR   CBC with Differential/Platelet   Lipid panel    Order Specific Question:   Has the patient fasted?    Answer:   Yes  Hemoglobin A1c   PSA   TSH      Meds ordered this encounter  Medications   fluticasone (FLONASE) 50 MCG/ACT nasal spray    Sig: Place 2 sprays into both nostrils daily. Use for 4-6 weeks then stop and use seasonally or as needed.    Dispense:  16 g    Refill:  3   amoxicillin-clavulanate (AUGMENTIN) 875-125 MG tablet    Sig: Take 1 tablet by mouth 2 (two) times daily.    Dispense:  20 tablet    Refill:  0      Follow up plan: Return if symptoms worsen or fail to improve.  Nobie Putnam, [facility] 12/04/2021, 11:14 AM

## 2021-12-05 LAB — HEMOGLOBIN A1C
Hgb A1c MFr Bld: 5.2 % of total Hgb (ref <5.7)
Mean Plasma Glucose: 103 mg/dL
eAG (mmol/L): 5.7 mmol/L

## 2021-12-05 LAB — CBC WITH DIFFERENTIAL/PLATELET
Absolute Monocytes: 459 cells/uL (ref 200–950)
Basophils Absolute: 22 cells/uL (ref 0–200)
Basophils Relative: 0.4 %
Eosinophils Absolute: 38 cells/uL (ref 15–500)
Eosinophils Relative: 0.7 %
HCT: 41.2 % (ref 38.5–50.0)
Hemoglobin: 13.9 g/dL (ref 13.2–17.1)
Lymphs Abs: 1015 cells/uL (ref 850–3900)
MCH: 31.3 pg (ref 27.0–33.0)
MCHC: 33.7 g/dL (ref 32.0–36.0)
MCV: 92.8 fL (ref 80.0–100.0)
MPV: 11.2 fL (ref 7.5–12.5)
Monocytes Relative: 8.5 %
Neutro Abs: 3866 cells/uL (ref 1500–7800)
Neutrophils Relative %: 71.6 %
Platelets: 178 10*3/uL (ref 140–400)
RBC: 4.44 10*6/uL (ref 4.20–5.80)
RDW: 12.3 % (ref 11.0–15.0)
Total Lymphocyte: 18.8 %
WBC: 5.4 10*3/uL (ref 3.8–10.8)

## 2021-12-05 LAB — LIPID PANEL
Cholesterol: 197 mg/dL (ref <200)
HDL: 71 mg/dL (ref >=40)
LDL Cholesterol (Calc): 112 mg/dL (calc) — ABNORMAL HIGH
Non-HDL Cholesterol (Calc): 126 mg/dL (calc) (ref <130)
Total CHOL/HDL Ratio: 2.8 (calc) (ref <5.0)
Triglycerides: 59 mg/dL (ref <150)

## 2021-12-05 LAB — COMPLETE METABOLIC PANEL WITH GFR
AG Ratio: 2 (calc) (ref 1.0–2.5)
ALT: 18 U/L (ref 9–46)
AST: 17 U/L (ref 10–35)
Albumin: 4.4 g/dL (ref 3.6–5.1)
Alkaline phosphatase (APISO): 82 U/L (ref 35–144)
BUN: 19 mg/dL (ref 7–25)
CO2: 26 mmol/L (ref 20–32)
Calcium: 9.4 mg/dL (ref 8.6–10.3)
Chloride: 105 mmol/L (ref 98–110)
Creat: 1.01 mg/dL (ref 0.70–1.30)
Globulin: 2.2 g/dL (calc) (ref 1.9–3.7)
Glucose, Bld: 88 mg/dL (ref 65–99)
Potassium: 4.4 mmol/L (ref 3.5–5.3)
Sodium: 141 mmol/L (ref 135–146)
Total Bilirubin: 0.9 mg/dL (ref 0.2–1.2)
Total Protein: 6.6 g/dL (ref 6.1–8.1)
eGFR: 89 mL/min/{1.73_m2} (ref >=60)

## 2021-12-05 LAB — TSH: TSH: 0.68 mIU/L (ref 0.40–4.50)

## 2021-12-05 LAB — PSA: PSA: 0.61 ng/mL (ref <=4.00)

## 2022-01-03 ENCOUNTER — Telehealth: Payer: Self-pay

## 2022-01-03 NOTE — Telephone Encounter (Signed)
Pt given lab results per notes of Dr. Althea Charon on 01/03/22. Pt verbalized understanding. Pt wants to discuss getting testosterone shots d/t low energy. Scheduled appt for  01/07/22 at 1500. Tried to help pt access Mychart account but d/t difficulties advised him to ask for assistance when he comes in to office.

## 2022-01-07 ENCOUNTER — Ambulatory Visit: Payer: 59 | Admitting: Family Medicine

## 2022-02-05 ENCOUNTER — Ambulatory Visit (INDEPENDENT_AMBULATORY_CARE_PROVIDER_SITE_OTHER): Payer: 59 | Admitting: Family Medicine

## 2022-02-05 ENCOUNTER — Encounter: Payer: Self-pay | Admitting: Family Medicine

## 2022-02-05 VITALS — BP 126/70 | HR 76 | Ht 73.0 in | Wt 209.0 lb

## 2022-02-05 DIAGNOSIS — R5383 Other fatigue: Secondary | ICD-10-CM | POA: Diagnosis not present

## 2022-02-05 DIAGNOSIS — E291 Testicular hypofunction: Secondary | ICD-10-CM | POA: Diagnosis not present

## 2022-02-05 DIAGNOSIS — E559 Vitamin D deficiency, unspecified: Secondary | ICD-10-CM

## 2022-02-05 DIAGNOSIS — E7800 Pure hypercholesterolemia, unspecified: Secondary | ICD-10-CM | POA: Diagnosis not present

## 2022-02-05 DIAGNOSIS — E538 Deficiency of other specified B group vitamins: Secondary | ICD-10-CM | POA: Diagnosis not present

## 2022-02-05 NOTE — Patient Instructions (Addendum)
Thank you for coming to the office today.  Check labs today for testosterone, vitamin D and B12  Stay tuned for results.  If Testosterone < 200 we can refer to Urologist for management of Low T  If testosterone is 200-300 we can still refer and they MAY offer treatment  If higher than 300 it is possible but likely not going to be the best option.  ---  [facility] -1st floor [address] [place],  [place]  [address] Phone: [phone]  ------------  [facility] 4.6 51 Google reviews  Doctor in [place], [place] Get online care: [email or web address] Address: [address], [place], [place] [address] Hours:  Open ? Closes 5:30?PM   More hours Phone: [phone]  Please schedule a Follow-up Appointment to: Return if symptoms worsen or fail to improve.  If you have any other questions or concerns, please feel free to call the office or send a message through MyChart. You may also schedule an earlier appointment if necessary.  Additionally, you may be receiving a survey about your experience at our office within a few days to 1 week by e-mail or mail. We value your feedback.  Saralyn Pilar, DO [facility], [place]

## 2022-02-05 NOTE — Progress Notes (Unsigned)
Subjective:    Patient ID: Gary Schultz, male    DOB: 01-18-1966, 56 y.o.   MRN: [id number]  Gary Schultz is a 56 y.o. male presenting on 02/05/2022 for Fatigue   HPI  Low Energy / Fatigue Asking about testosterone labs, will check today  Advanced Osteoarthritis Bilateral knees History of Meniscus Tear [place] Has done x 3 gel injections visco from Ortho Still has issues with meniscus pinching, due to significant DJD of knees and degenerative meniscus Awaiting progress on these He is worried about joint replacement surgery complications      88/28/0034   11:56 AM 10/23/2021   11:10 AM 07/18/2021   11:26 AM  Depression screen PHQ 2/9  Decreased Interest 0 0 2  Down, Depressed, Hopeless _0 PHQ - 2 Score _1 Altered sleeping _2 Tired, decreased energy _3 Change in appetite 2 2 0  Feeling bad or failure about yourself  1  1  Trouble concentrating _4 Moving slowly or fidgety/restless 0 1 0  Suicidal thoughts 0 0 0  PHQ-9 Score _5 Difficult doing work/chores Not difficult at all Extremely dIfficult Not difficult at all    Social History   Tobacco Use   Smoking status: Never   Smokeless tobacco: Never  Vaping Use   Vaping Use: Never used  Substance Use Topics   Alcohol use: Yes    Alcohol/week: 3.0 standard drinks of alcohol    Types: 3 Glasses of wine per week    Comment: occassional   Drug use: No    Review of Systems Per HPI unless specifically indicated above     Objective:    BP 126/70   Pulse 76   Ht 6' 1" (1.854 m)   Wt 209 lb (94.8 kg)   SpO2 99%   BMI 27.57 kg/m   Wt Readings from Last 3 Encounters:  02/05/22 209 lb (94.8 kg)  12/04/21 207 lb (93.9 kg)  10/23/21 211 lb (95.7 kg)    Physical Exam Vitals and nursing note reviewed.  Constitutional:      General: He is not in acute distress.    Appearance: Normal appearance. He is well-developed. He is not diaphoretic.     Comments:  Well-appearing, comfortable, cooperative  HENT:     Head: Normocephalic and atraumatic.  Eyes:     General:        Right eye: No discharge.        Left eye: No discharge.     Conjunctiva/sclera: Conjunctivae normal.  Cardiovascular:     Rate and Rhythm: Normal rate.  Pulmonary:     Effort: Pulmonary effort is normal.  Skin:    General: Skin is warm and dry.     Findings: No erythema or rash.  Neurological:     Mental Status: He is alert and oriented to person, place, and time.  Psychiatric:        Mood and Affect: Mood normal.        Behavior: Behavior normal.        Thought Content: Thought content normal.     Comments: Well groomed, good eye contact, normal speech and thoughts      Results for orders placed or performed in visit on 12/04/21  COMPLETE METABOLIC PANEL WITH GFR  Result Value Ref Range   Glucose, Bld 88 65 - 99 mg/dL   BUN 19 7 -  25 mg/dL   Creat 1.01 0.70 - 1.30 mg/dL   eGFR 89 > OR = 60 mL/min/1.49m   BUN/Creatinine Ratio SEE NOTE: 6 - 22 (calc)   Sodium 141 135 - 146 mmol/L   Potassium 4.4 3.5 - 5.3 mmol/L   Chloride 105 98 - 110 mmol/L   CO2 26 20 - 32 mmol/L   Calcium 9.4 8.6 - 10.3 mg/dL   Total Protein 6.6 6.1 - 8.1 g/dL   Albumin 4.4 3.6 - 5.1 g/dL   Globulin 2.2 1.9 - 3.7 g/dL (calc)   AG Ratio 2.0 1.0 - 2.5 (calc)   Total Bilirubin 0.9 0.2 - 1.2 mg/dL   Alkaline phosphatase (APISO) 82 35 - 144 U/L   AST 17 10 - 35 U/L   ALT 18 9 - 46 U/L  CBC with Differential/Platelet  Result Value Ref Range   WBC 5.4 3.8 - 10.8 Thousand/uL   RBC 4.44 4.20 - 5.80 Million/uL   Hemoglobin 13.9 13.2 - 17.1 g/dL   HCT 41.2 38.5 - 50.0 %   MCV 92.8 80.0 - 100.0 fL   MCH 31.3 27.0 - 33.0 pg   MCHC 33.7 32.0 - 36.0 g/dL   RDW 12.3 11.0 - 15.0 %   Platelets 178 140 - 400 Thousand/uL   MPV 11.2 7.5 - 12.5 fL   Neutro Abs 3,866 1,500 - 7,800 cells/uL   Lymphs Abs 1,015 850 - 3,900 cells/uL   Absolute Monocytes 459 200 - 950 cells/uL   Eosinophils Absolute  38 15 - 500 cells/uL   Basophils Absolute 22 0 - 200 cells/uL   Neutrophils Relative % 71.6 %   Total Lymphocyte 18.8 %   Monocytes Relative 8.5 %   Eosinophils Relative 0.7 %   Basophils Relative 0.4 %  Lipid panel  Result Value Ref Range   Cholesterol 197 <200 mg/dL   HDL 71 > OR = 40 mg/dL   Triglycerides 59 <150 mg/dL   LDL Cholesterol (Calc) 112 (H) mg/dL (calc)   Total CHOL/HDL Ratio 2.8 <5.0 (calc)   Non-HDL Cholesterol (Calc) 126 <130 mg/dL (calc)  Hemoglobin A1c  Result Value Ref Range   Hgb A1c MFr Bld 5.2 <5.7 % of total Hgb   Mean Plasma Glucose 103 mg/dL   eAG (mmol/L) 5.7 mmol/L  PSA  Result Value Ref Range   PSA 0.61 < OR = 4.00 ng/mL  TSH  Result Value Ref Range   TSH 0.68 0.40 - 4.50 mIU/L      Assessment & Plan:   Problem List Items Addressed This Visit   None Visit Diagnoses     Decreased energy    -  Primary   Other fatigue       B12 deficiency       Relevant Orders   Vitamin B12   Elevated LDL cholesterol level       Hypogonadism male       Relevant Orders   Testosterone   Vitamin D deficiency       Relevant Orders   VITAMIN D 25 Hydroxy (Vit-D Deficiency, Fractures)       Fatigue, Reduced Energy Will do medical work up labs Check labs today for testosterone, vitamin D and B12 Stay tuned for results.  If Testosterone < 200 we can refer to Urologist for management of Low T  If testosterone is 200-300 we can still refer and they MAY offer treatment  If higher than 300 it is possible but likely not going to be the  best option.  No orders of the defined types were placed in this encounter.     Follow up plan: Return if symptoms worsen or fail to improve.   Nobie Putnam, DO [facility] 02/05/2022, 9:15 AM

## 2022-02-06 ENCOUNTER — Encounter: Payer: Self-pay | Admitting: Family Medicine

## 2022-02-06 LAB — TESTOSTERONE: Testosterone: 324 ng/dL (ref 250–827)

## 2022-02-06 LAB — VITAMIN D 25 HYDROXY (VIT D DEFICIENCY, FRACTURES): Vit D, 25-Hydroxy: 50 ng/mL (ref 30–100)

## 2022-02-06 LAB — VITAMIN B12: Vitamin B-12: 254 pg/mL (ref 200–1100)

## 2022-02-06 NOTE — Addendum Note (Signed)
Addended by: Smitty Cords on: 02/06/2022 06:18 PM   Modules accepted: Orders

## 2022-03-25 ENCOUNTER — Encounter: Payer: Self-pay | Admitting: Family Medicine

## 2022-03-25 ENCOUNTER — Ambulatory Visit (INDEPENDENT_AMBULATORY_CARE_PROVIDER_SITE_OTHER): Payer: 59 | Admitting: Family Medicine

## 2022-03-25 ENCOUNTER — Ambulatory Visit
Admission: RE | Admit: 2022-03-25 | Discharge: 2022-03-25 | Disposition: A | Discharge status: Home / Self Care | Payer: 59 | Source: Ambulatory Visit | Attending: Family Medicine | Admitting: Family Medicine

## 2022-03-25 ENCOUNTER — Ambulatory Visit
Admission: RE | Admit: 2022-03-25 | Discharge: 2022-03-25 | Disposition: A | Discharge status: Home / Self Care | Payer: 59 | Attending: Family Medicine | Admitting: Family Medicine

## 2022-03-25 DIAGNOSIS — M545 Low back pain, unspecified: Secondary | ICD-10-CM | POA: Diagnosis present

## 2022-03-25 DIAGNOSIS — M542 Cervicalgia: Secondary | ICD-10-CM

## 2022-03-25 DIAGNOSIS — M62838 Other muscle spasm: Secondary | ICD-10-CM | POA: Diagnosis not present
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
## 2022-03-25 NOTE — Patient Instructions (Addendum)
Thank you for coming to the office today.  X-rays today Neck and Low Back.  Keep on current medications.  Call schedule Urology apt.  [facility] -[address]st floor [place],  [place]  [address] Phone: [phone]  Please schedule a Follow-up Appointment to: Return if symptoms worsen or fail to improve.  If you have any other questions or concerns, please feel free to call the office or send a message through [place]. You may also schedule an earlier appointment if necessary.  Additionally, you may be receiving a survey about your experience at our office within a few days to 1 week by e-mail or mail. We value your feedback.  Nobie Putnam, DO [place]

## 2022-03-26 ENCOUNTER — Encounter: Payer: Self-pay | Admitting: Family Medicine

## 2022-04-02 ENCOUNTER — Ambulatory Visit (INDEPENDENT_AMBULATORY_CARE_PROVIDER_SITE_OTHER): Payer: 59 | Admitting: Urology

## 2022-04-02 ENCOUNTER — Encounter: Payer: Self-pay | Admitting: Urology

## 2022-04-02 ENCOUNTER — Other Ambulatory Visit: Payer: Self-pay | Admitting: Family Medicine

## 2022-04-02 VITALS — BP 151/80 | HR 73 | Ht 73.0 in | Wt 208.0 lb

## 2022-04-02 DIAGNOSIS — R5383 Other fatigue: Secondary | ICD-10-CM

## 2022-04-02 DIAGNOSIS — R6882 Decreased libido: Secondary | ICD-10-CM

## 2022-04-02 DIAGNOSIS — N529 Male erectile dysfunction, unspecified: Secondary | ICD-10-CM | POA: Diagnosis not present

## 2022-04-02 NOTE — Progress Notes (Signed)
04/02/2022 1:13 PM   Gary Schultz December 09, 1965 [id number]  Referring provider: Olin Hauser, DO [address] [place],  [place] [address]  Chief Complaint  Patient presents with   New Patient (Initial Visit)    HPI: Gary Schultz is a 57 y.o. male referred for evaluation of hypogonadism symptoms.  4-year history of fatigue, decreased energy, low libido, erectile dysfunction ED has improved with Cialis Has occasional decreased force of his urinary stream and lower abdominal discomfort Testosterone level 02/05/2022 low normal at 324 ng/dL Has degenerative disc disease and on opiate therapy though he does not take daily   PMH: Past Medical History:  Diagnosis Date   Anxiety    Carpal tunnel syndrome    bilateral   DDD (degenerative disc disease), lumbar    Generalized headaches    from neck injury   GERD (gastroesophageal reflux disease)    Hypertension    Neck pain    Neuromuscular disorder ([place])    Varicose veins     Surgical History: Past Surgical History:  Procedure Laterality Date   BACK SURGERY  2006   L4-L5 herniated disc repair   CARPAL TUNNEL RELEASE Right 12/22/2019   Procedure: CARPAL TUNNEL RELEASE ENDOSCOPIC;  Surgeon: Corky Mull, MD;  Location: [facility];  Service: Orthopedics;  Laterality: Right;   CARPAL TUNNEL RELEASE Left 02/29/2020   Procedure: CARPAL TUNNEL RELEASE ENDOSCOPIC;  Surgeon: Corky Mull, MD;  Location: [facility];  Service: Orthopedics;  Laterality: Left;   CLAVICLE SURGERY Right    COLONOSCOPY WITH PROPOFOL N/A 06/08/2020   Procedure: COLONOSCOPY WITH PROPOFOL;  Surgeon: [name], MD;  Location: [facility];  Service: Endoscopy;  Laterality: N/A;   FRACTURE SURGERY     KNEE ARTHROSCOPY WITH MEDIAL MENISECTOMY Left 09/28/2018   Procedure: KNEE ARTHROSCOPY WITH DEBRIDEMENT, abrasion chondroplasty of patella, PARTIAL MEDIAL MENISCECTOMY;  Surgeon: Corky Mull, MD;  Location: [facility];  Service: Orthopedics;   Laterality: Left;   KNEE ARTHROSCOPY WITH MEDIAL MENISECTOMY Right 12/07/2018   Procedure: RIGHT KNEE ARTHROSCOPY WITH DEBRIDEMENT, REPAIR, PARTIAL MEDIAL MENISECTOMY;  Surgeon: Corky Mull, MD;  Location: [facility];  Service: Orthopedics;  Laterality: Right;   REPAIR OF PERONEUS BREVIS TENDON Left 03/29/2019   Procedure: Debridement of chronic peroneus brevis tendinopathy with reconstruction of calcaneofibular and anterior talofibular ligaments;  Surgeon: Corky Mull, MD;  Location: [facility];  Service: Orthopedics;  Laterality: Left;   SHOULDER SURGERY Right 1992    Home Medications:  Allergies as of 04/02/2022       Reactions   Tylenol [acetaminophen] Hives, Itching, Rash, Other (See Comments)   Stomach pain        Medication List        Accurate as of April 02, 2022  1:13 PM. If you have any questions, ask your nurse or doctor.          STOP taking these medications    celecoxib 200 MG capsule Commonly known as: CELEBREX Stopped by: Abbie Sons, MD   hyoscyamine 0.125 MG tablet Commonly known as: LEVSIN Stopped by: Abbie Sons, MD   naloxone 4 MG/0.1ML Liqd nasal spray kit Commonly known as: NARCAN Stopped by: Abbie Sons, MD   tiZANidine 2 MG tablet Commonly known as: ZANAFLEX Stopped by: Abbie Sons, MD       TAKE these medications    diclofenac Sodium 1 % Gel Commonly known as: VOLTAREN SMARTSIG:2-4 Gram(s) Topical 2-4 Times Daily  fluticasone 50 MCG/ACT nasal spray Commonly known as: FLONASE Place 2 sprays into both nostrils daily. Use for 4-6 weeks then stop and use seasonally or as needed.   gabapentin 300 MG capsule Commonly known as: NEURONTIN Take 300-900 mg by mouth 3 (three) times daily as needed (pain).   lidocaine 5 % Commonly known as: LIDODERM Place 1-2 patches onto the skin daily as needed (back pain).   lisinopril 30 MG tablet Commonly known as: ZESTRIL Take 1 tablet (30 mg total) by mouth daily.    omeprazole 20 MG capsule Commonly known as: PRILOSEC Take 1 capsule (20 mg total) by mouth daily before breakfast. Extra refills on file   Oxycodone HCl 20 MG Tabs Take 20 mg by mouth 5 (five) times daily as needed (pain).   tadalafil 20 MG tablet Commonly known as: CIALIS Take 0.5-1 tablets (10-20 mg total) by mouth every other day as needed for erectile dysfunction.        Allergies:  Allergies  Allergen Reactions   Tylenol [Acetaminophen] Hives, Itching, Rash and Other (See Comments)    Stomach pain    Family History: Family History  Problem Relation Age of Onset   Cancer Mother    Varicose Veins Mother    Cancer Father    Stroke Father    Hypertension Father    Colon cancer Father     Social History:  reports that he has never smoked. He has never used smokeless tobacco. He reports current alcohol use of about 3.0 standard drinks of alcohol per week. He reports that he does not use drugs.   Physical Exam: BP (!) 151/80   Pulse 73   Ht 6' 1"$  (1.854 m)   Wt 208 lb (94.3 kg)   BMI 27.44 kg/m   Constitutional:  Alert and oriented, No acute distress. HEENT: [place] AT Respiratory: Normal respiratory effort, no increased work of breathing. GU: Phallus circumcised.  ~ 1-2 mm smooth, dorsal subdermal penile mass either small thrombosed vein or cyst.  Testes descended bilateral without masses or tenderness.  Estimated volume 20 cc bilaterally Skin: No rashes, bruises or suspicious lesions. Neurologic: Grossly intact, no focal deficits, moving all 4 extremities. Psychiatric: Normal mood and affect.   Assessment & Plan:   57 y.o. male with symptoms of hypogonadism.  Testosterone level is low normal at 324 We discussed the diagnosis of testosterone is based on 2 abnormal total testosterone levels drawn in the a.m. admitted with signs and symptoms of low testosterone. Will schedule a.m. lab visit for free/total testosterone and LH He will be notified of the results and  further recommendations at that time   Abbie Sons, MD  [facility] [address], [place], [place] [address] [phone]
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
## 2022-04-03 ENCOUNTER — Other Ambulatory Visit: Payer: Self-pay

## 2022-04-03 DIAGNOSIS — J0110 Acute frontal sinusitis, unspecified: Secondary | ICD-10-CM

## 2022-04-03 MED ORDER — FLUTICASONE PROPIONATE 50 MCG/ACT NA SUSP: 2.0000 | Freq: Every day | NASAL | 3 refills | Status: DC

## 2022-04-06 ENCOUNTER — Other Ambulatory Visit: Payer: Self-pay | Admitting: Family Medicine

## 2022-04-06 DIAGNOSIS — N529 Male erectile dysfunction, unspecified: Secondary | ICD-10-CM
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
## 2022-04-09 ENCOUNTER — Other Ambulatory Visit: Payer: 59

## 2022-04-09 ENCOUNTER — Ambulatory Visit: Payer: Self-pay | Admitting: *Deleted

## 2022-04-09 DIAGNOSIS — M47816 Spondylosis without myelopathy or radiculopathy, lumbar region: Secondary | ICD-10-CM

## 2022-04-09 DIAGNOSIS — R6882 Decreased libido: Secondary | ICD-10-CM

## 2022-04-09 DIAGNOSIS — M5441 Lumbago with sciatica, right side: Secondary | ICD-10-CM

## 2022-04-09 DIAGNOSIS — M5136 Other intervertebral disc degeneration, lumbar region: Secondary | ICD-10-CM
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
## 2022-04-10 ENCOUNTER — Telehealth: Payer: Self-pay

## 2022-04-10 MED ORDER — PREDNISONE 20 MG PO TABS: ORAL_TABLET | ORAL | 0 refills | Status: DC
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
## 2022-04-10 NOTE — Telephone Encounter (Signed)
I just talked to patient today. He agreed to medication and awaiting MRI prior to referral.  Nobie Putnam, DO [facility] 04/10/2022, 4:29 PM

## 2022-04-10 NOTE — Addendum Note (Signed)
Addended by: Olin Hauser on: 04/10/2022 03:18 PM   Modules accepted: Orders
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
## 2022-04-11 ENCOUNTER — Other Ambulatory Visit: Payer: Self-pay | Admitting: Family Medicine

## 2022-04-11 DIAGNOSIS — I10 Essential (primary) hypertension: Secondary | ICD-10-CM

## 2022-04-11 DIAGNOSIS — R142 Eructation: Secondary | ICD-10-CM
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
## 2022-04-14 ENCOUNTER — Other Ambulatory Visit: Payer: Self-pay | Admitting: Family Medicine

## 2022-04-14 ENCOUNTER — Telehealth: Payer: Self-pay | Admitting: Family Medicine

## 2022-04-14 DIAGNOSIS — E291 Testicular hypofunction: Secondary | ICD-10-CM

## 2022-04-14 NOTE — Telephone Encounter (Signed)
-----   Message from Abbie Sons, MD sent at 04/13/2022  1:40 PM EST ----- Testosterone level was significantly low at 84 which is a large discrepancy between his previous level of 324.  Repeat a.m. testosterone level

## 2022-04-14 NOTE — Telephone Encounter (Signed)
Patient notified and appointment has been made.

## 2022-04-15 ENCOUNTER — Ambulatory Visit: Payer: Self-pay

## 2022-04-15 NOTE — Telephone Encounter (Signed)
Keep apt to discuss med options with me. There is not much else left other than pain medication at this point but I can discuss with him.  Nobie Putnam, [facility] 04/15/2022, 3:24 PM
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
## 2022-04-16 ENCOUNTER — Ambulatory Visit (INDEPENDENT_AMBULATORY_CARE_PROVIDER_SITE_OTHER): Payer: 59 | Admitting: Family Medicine

## 2022-04-16 ENCOUNTER — Encounter: Payer: Self-pay | Admitting: Family Medicine

## 2022-04-16 VITALS — BP 138/76 | HR 83 | Ht 73.0 in | Wt 204.0 lb

## 2022-04-16 DIAGNOSIS — M47816 Spondylosis without myelopathy or radiculopathy, lumbar region: Secondary | ICD-10-CM | POA: Diagnosis not present

## 2022-04-16 DIAGNOSIS — M5136 Other intervertebral disc degeneration, lumbar region: Secondary | ICD-10-CM | POA: Diagnosis not present

## 2022-04-16 DIAGNOSIS — M5441 Lumbago with sciatica, right side: Secondary | ICD-10-CM | POA: Diagnosis not present

## 2022-04-16 DIAGNOSIS — S00259A Superficial foreign body of unspecified eyelid and periocular area, initial encounter: Secondary | ICD-10-CM

## 2022-04-16 DIAGNOSIS — G8929 Other chronic pain: Secondary | ICD-10-CM

## 2022-04-16 DIAGNOSIS — M5442 Lumbago with sciatica, left side: Secondary | ICD-10-CM

## 2022-04-16 LAB — LUTEINIZING HORMONE: LH: 5.5 m[IU]/mL (ref 1.7–8.6)

## 2022-04-16 LAB — TESTOSTERONE,FREE AND TOTAL
Testosterone, Free: 0.3 pg/mL — ABNORMAL LOW (ref 7.2–24.0)
Testosterone: 84 ng/dL — ABNORMAL LOW (ref 264–916)
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
## 2022-04-16 NOTE — Patient Instructions (Addendum)
Thank you for coming to the office today.  Treatment plan:  I have no other medication options for your pain unfortunately, you are on the best options I have available.  I will check with status of your Lumbar MRI now, they are checking on scheduling it now.  Once you are done with Lumbar MRI then I will submit referral to Neurosurgery spine specialist next.  Neck X-ray has been done, we will refer you for both neck and back. The last neck MRI was done 2021.  [facility]) / Surgery performed at [place], [place] [address] Ph - [phone] (to refer)   Please schedule a Follow-up Appointment to: Return if symptoms worsen or fail to improve.  If you have any other questions or concerns, please feel free to call the office or send a message through [place]. You may also schedule an earlier appointment if necessary.  Additionally, you may be receiving a survey about your experience at our office within a few days to 1 week by e-mail or mail. We value your feedback.  Nobie Putnam, DO [place]

## 2022-04-17 ENCOUNTER — Other Ambulatory Visit: Payer: 59

## 2022-04-17 DIAGNOSIS — E291 Testicular hypofunction: Secondary | ICD-10-CM

## 2022-04-18 ENCOUNTER — Telehealth: Payer: Self-pay | Admitting: Urology

## 2022-04-18 DIAGNOSIS — E291 Testicular hypofunction: Secondary | ICD-10-CM

## 2022-04-18 LAB — TESTOSTERONE: Testosterone: 244 ng/dL — ABNORMAL LOW (ref 264–916)

## 2022-04-18 NOTE — Telephone Encounter (Signed)
Repeat testosterone level low at 244.  His LH was normal.  He could be started on oral Clomid to stimulate his natural testosterone production.  If he desires to start testosterone the 2 most common forms are injections and topical gels.  Please let me know if he has any questions and if he has a preferred choice.

## 2022-04-18 NOTE — Telephone Encounter (Signed)
Patient notified and would like to do the Testosterone injections. He will need to have teaching. I informed him that when he gets the medication we will get him scheduled for teaching.

## 2022-04-21 ENCOUNTER — Ambulatory Visit
Admission: RE | Admit: 2022-04-21 | Discharge: 2022-04-21 | Disposition: A | Discharge status: Home / Self Care | Payer: 59 | Source: Ambulatory Visit | Attending: Family Medicine | Admitting: Family Medicine

## 2022-04-21 DIAGNOSIS — M5136 Other intervertebral disc degeneration, lumbar region: Secondary | ICD-10-CM | POA: Insufficient documentation

## 2022-04-21 DIAGNOSIS — M47816 Spondylosis without myelopathy or radiculopathy, lumbar region: Secondary | ICD-10-CM | POA: Diagnosis present

## 2022-04-21 DIAGNOSIS — M5441 Lumbago with sciatica, right side: Secondary | ICD-10-CM | POA: Insufficient documentation

## 2022-04-21 MED ORDER — TESTOSTERONE CYPIONATE 200 MG/ML IM SOLN: 200.0000 mg | INTRAMUSCULAR | 0 refills | Status: DC

## 2022-04-23 ENCOUNTER — Telehealth: Payer: Self-pay

## 2022-04-23 DIAGNOSIS — M47812 Spondylosis without myelopathy or radiculopathy, cervical region: Secondary | ICD-10-CM

## 2022-04-23 DIAGNOSIS — M5030 Other cervical disc degeneration, unspecified cervical region: Secondary | ICD-10-CM

## 2022-04-23 DIAGNOSIS — M5412 Radiculopathy, cervical region: Secondary | ICD-10-CM

## 2022-04-23 NOTE — Telephone Encounter (Signed)
I called and left a message asking that he call back so we can clarify the info about imaging.

## 2022-04-23 NOTE — Addendum Note (Signed)
Addended by: Olin Hauser on: 04/23/2022 02:31 PM   Modules accepted: Orders

## 2022-04-23 NOTE — Telephone Encounter (Signed)
It looks like patient saw [facility] Neurology on 3/1  My original order was only for Lumbar MRI because at the time this was the patients primary concern with low back pain.  We agreed to pursue MRI of lumbar spine.  It sounds like either the patient and or the Neurology office are now asking [place] to order MRI Cervical Spine?  I can place the order if needed ,if they are not ordering it.  Would you be able to clarify this with the patient before I order?  Nobie Putnam, [facility] 04/23/2022, 8:29 AM

## 2022-04-23 NOTE — Telephone Encounter (Signed)
Copied from [place] [phone]. Topic: General - Other >> Apr 22, 2022  4:47 PM Denman George T wrote: Reason for CRM: Patient called stated his Neurologist said he needed to contact primary for a MRI on his neck / [phone] / [employer] / [employer]

## 2022-04-23 NOTE — Telephone Encounter (Signed)
Ordered Cervical Spine MRI wo contrast at [facility].  As requested by [facility] Neurology  Nobie Putnam, [facility] 04/23/2022, 2:31 PM

## 2022-04-25 ENCOUNTER — Ambulatory Visit
Admission: RE | Admit: 2022-04-25 | Discharge: 2022-04-25 | Disposition: A | Discharge status: Home / Self Care | Payer: 59 | Source: Ambulatory Visit | Attending: Family Medicine | Admitting: Family Medicine

## 2022-04-25 DIAGNOSIS — M47812 Spondylosis without myelopathy or radiculopathy, cervical region: Secondary | ICD-10-CM | POA: Diagnosis not present

## 2022-04-25 DIAGNOSIS — M5412 Radiculopathy, cervical region: Secondary | ICD-10-CM | POA: Diagnosis present

## 2022-04-25 DIAGNOSIS — M5030 Other cervical disc degeneration, unspecified cervical region: Secondary | ICD-10-CM | POA: Insufficient documentation

## 2022-04-28 ENCOUNTER — Other Ambulatory Visit: Payer: Self-pay | Admitting: Family Medicine

## 2022-04-28 ENCOUNTER — Encounter: Payer: Self-pay | Admitting: Physician Assistant

## 2022-04-28 ENCOUNTER — Ambulatory Visit (INDEPENDENT_AMBULATORY_CARE_PROVIDER_SITE_OTHER): Payer: 59 | Admitting: Physician Assistant

## 2022-04-28 VITALS — BP 138/89 | HR 69 | Ht 73.0 in | Wt 204.0 lb

## 2022-04-28 DIAGNOSIS — E291 Testicular hypofunction: Secondary | ICD-10-CM | POA: Diagnosis not present

## 2022-04-28 DIAGNOSIS — J0110 Acute frontal sinusitis, unspecified: Secondary | ICD-10-CM

## 2022-04-28 NOTE — Telephone Encounter (Signed)
Called [facility] in [place] [phone] to verify if patient picked up refill. Pharmacy staff reports last pick up 04/01/22 and is working on refill today as well.
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
## 2022-04-28 NOTE — Progress Notes (Signed)
Patient presents today for Testosterone injection teaching. Patient was instructed on how to properly use the 18guage needle to draw up 1cc of the testosterone, into 3cc syringe then changed the needle to the 21guage for injection. Patient then cleaned the vastus lateralis with an alcohol swab and injected the site with bevel up.   Patient dose: '200mg'$  (45m) Lot Number: [id number] date: 01/2025 Location: Right  Patient verbalized understanding current dose is 153mevery 14days unless instructed by a provider.  Patient tolerated well.  Patient understood how to dispose of sharps properly and store medication.   Performed by: SaDebroah LoopPA-C   I spent 20 minutes on the day of the encounter to include pre-visit record review, face-to-face time with the patient, and post-visit ordering of tests.

## 2022-04-28 NOTE — Patient Instructions (Signed)

## 2022-04-29 ENCOUNTER — Telehealth: Payer: Self-pay

## 2022-04-29 ENCOUNTER — Encounter: Payer: Self-pay | Admitting: Family Medicine

## 2022-04-29 ENCOUNTER — Ambulatory Visit (INDEPENDENT_AMBULATORY_CARE_PROVIDER_SITE_OTHER): Payer: 59 | Admitting: Family Medicine

## 2022-04-29 VITALS — BP 128/72 | HR 84 | Ht 73.0 in | Wt 205.0 lb

## 2022-04-29 DIAGNOSIS — M5412 Radiculopathy, cervical region: Secondary | ICD-10-CM | POA: Diagnosis not present

## 2022-04-29 DIAGNOSIS — M5136 Other intervertebral disc degeneration, lumbar region: Secondary | ICD-10-CM

## 2022-04-29 DIAGNOSIS — M542 Cervicalgia: Secondary | ICD-10-CM

## 2022-04-29 DIAGNOSIS — M5030 Other cervical disc degeneration, unspecified cervical region: Secondary | ICD-10-CM

## 2022-04-29 DIAGNOSIS — M4802 Spinal stenosis, cervical region: Secondary | ICD-10-CM

## 2022-04-29 DIAGNOSIS — M5441 Lumbago with sciatica, right side: Secondary | ICD-10-CM | POA: Diagnosis not present

## 2022-04-29 DIAGNOSIS — G8929 Other chronic pain: Secondary | ICD-10-CM

## 2022-04-29 NOTE — Patient Instructions (Addendum)
Thank you for coming to the office today.  To share with the insurance:  Both MRI images were completed for Cervical Spine (neck) and Lumbar spine (back)  Here are the diagnosis codes: Neural Foraminal Stenosis of Cervical Spine = M48.02 ("Nerve channel narrowing") Degenerative Disc Disease Cervical Spine = M50.30 Cervical Radiculopathy = M54.12 Degenerative Disc Disease Lumbar Spine with Bulging Disc = M51.36 Chronic Low Back Pain with Sciatica R sided = M54.41 Neck Pain = M54.2  Referral today sent to [facility] in [facility] at [facility]) [place], [place] [address] Ph - [phone]  They will schedule you with the Neurosurgery next, after they review the MRI images.  Also keep updated with next follow-up with Neurology ("non surgical")  Connye Burkitt Neurology at 06/02/22 10:15am  [place], [address], [place], [place] [address]  [phone] (Work)    Please schedule a Follow-up Appointment to: Return if symptoms worsen or fail to improve.  If you have any other questions or concerns, please feel free to call the office or send a message through [place]. You may also schedule an earlier appointment if necessary.  Additionally, you may be receiving a survey about your experience at our office within a few days to 1 week by e-mail or mail. We value your feedback.  Nobie Putnam, DO [place]

## 2022-04-29 NOTE — Telephone Encounter (Unsigned)
Copied from [place] [phone]. Topic: Referral - Request for Referral >> Apr 29, 2022  2:34 PM Denman George T wrote: Has patient seen PCP for this complaint? Yes.    Referral for which specialty: Pain Management   Reason for referral: patient is having pain and need a pain management dr that give injections and the one he see right now is not working for him
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
## 2022-04-30 ENCOUNTER — Encounter: Payer: Self-pay | Admitting: Family Medicine

## 2022-04-30 NOTE — Telephone Encounter (Signed)
Please notify patient that I would recommend that he wait until he sees [facility] Neurosurgery first, as they will likely offer injections or procedure options for treatment. He should see what they recommend first before changing pain doctors.  --------------------------  Below is my reasoning if he has further questions. This is just for documentation purposes. Thank you!   I am a little unsure his plan with this request. I have referred him back in Sept to [facility] Management in [place] because he was interested in medication management.  He was just referred to [facility] Neurosurgery spine specialist yesterday on 04/29/22.  They may be offering injections or other therapy options.  It will take several weeks or longer to get into other Pain Management. He probably will see the Neurosurgeon before new Pain Management.  I would advise against switching pain doctors immediately.  Also, many other pain doctors, do not order opiate pain medication. Many other local pain management doctors will only perform injections and not order medication. So - I would advise he wait until he sees the Neurosurgery to see what their treatment plan is, and determine what he wants to do from there.  Nobie Putnam, [facility] 04/30/2022, 5:47 PM

## 2022-05-05 ENCOUNTER — Telehealth: Payer: Self-pay | Admitting: Family Medicine

## 2022-05-05 NOTE — Telephone Encounter (Signed)
Gary Schultz calling from the [place] is calling to follow up on the Medical Records Release for Medical Records. Received 04/22/22. Gary Schultz would like to see if the the records can be expedited? Pt has Social Security disability Claim Hearing on 05/13/22. All edited fees are permissible. Please advise CB- [phone]

## 2022-05-06 NOTE — Telephone Encounter (Signed)
Pt would like a referral to a new neurologist for his lower back disc and pinched nerve / he wants a referral to a place other than [facility] [place] / please advise / pt wasn't pleased

## 2022-05-07 NOTE — Telephone Encounter (Signed)
Routing new referral update request to [place] 05/07/2022, 11:49 AM

## 2022-05-07 NOTE — Telephone Encounter (Signed)
Pt stated he would like his referral for Neurosurgery to go to [facility] / Address: [address], [place], [place] [address].  Stated they have great doctors and would like to go there instead.  Please advise.

## 2022-05-07 NOTE — Telephone Encounter (Signed)
Spoke with attorney on  3/5 request for records was fax to medical records on that  day  Gregary Signs will follow up to locate records

## 2022-05-16 ENCOUNTER — Telehealth: Payer: Self-pay

## 2022-05-16 NOTE — Telephone Encounter (Signed)
Letter written, sent to his mychart  Gary Schultz, [facility] 05/16/2022, 5:57 PM
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
## 2022-06-16 ENCOUNTER — Other Ambulatory Visit: Payer: Self-pay

## 2022-06-16 ENCOUNTER — Other Ambulatory Visit: Payer: 59

## 2022-06-16 DIAGNOSIS — E291 Testicular hypofunction: Secondary | ICD-10-CM

## 2022-06-17 ENCOUNTER — Telehealth: Payer: Self-pay | Admitting: Family Medicine

## 2022-06-17 LAB — TESTOSTERONE: Testosterone: 202 ng/dL — ABNORMAL LOW (ref 264–916)

## 2022-06-17 LAB — HEMOGLOBIN AND HEMATOCRIT, BLOOD
Hematocrit: 47.2 % (ref 37.5–51.0)
Hemoglobin: 15.9 g/dL (ref 13.0–17.7)

## 2022-06-17 LAB — PSA: Prostate Specific Ag, Serum: 0.6 ng/mL (ref 0.0–4.0)

## 2022-06-17 NOTE — Telephone Encounter (Signed)
Please have him continue his current dose for his next injection and schedule him for a lab visit for repeat testosterone check at the midpoint. It's possible we need to adjust his dose.

## 2022-06-17 NOTE — Telephone Encounter (Signed)
Spoke to patient and he is due for the next injection tomorrow, so he was on the end of the injection. I informed him that we really want to see the result at the mid week time and for future reference call our office and change the lab appointment if it's not at the mid week of injection. Patient voiced understanding and wants to know if he should be doing something else. He states he is very tired all the time and has no energy even when he does the injection?

## 2022-06-17 NOTE — Telephone Encounter (Signed)
-----   Message from Carman Ching, [place] sent at 06/17/2022 10:08 AM EDT ----- Testosterone is subtherapeutic. Is he still doing testosterone injections? If so, when was his last injection?

## 2022-06-17 NOTE — Telephone Encounter (Signed)
LMOM for patient to return call and schedule mid section lab appointment. I did inform to continue testosterone at the same dose.

## 2022-06-18 NOTE — Telephone Encounter (Signed)
Melissa scheduled patient for the lab appointment as requested

## 2022-06-19 ENCOUNTER — Encounter: Payer: Self-pay | Admitting: Dermatology

## 2022-06-19 ENCOUNTER — Ambulatory Visit (INDEPENDENT_AMBULATORY_CARE_PROVIDER_SITE_OTHER): Payer: 59 | Admitting: Dermatology

## 2022-06-19 VITALS — BP 116/76 | HR 85

## 2022-06-19 DIAGNOSIS — L821 Other seborrheic keratosis: Secondary | ICD-10-CM

## 2022-06-19 DIAGNOSIS — D229 Melanocytic nevi, unspecified: Secondary | ICD-10-CM

## 2022-06-19 DIAGNOSIS — L820 Inflamed seborrheic keratosis: Secondary | ICD-10-CM | POA: Diagnosis not present

## 2022-06-19 DIAGNOSIS — Z1283 Encounter for screening for malignant neoplasm of skin: Secondary | ICD-10-CM

## 2022-06-19 DIAGNOSIS — D692 Other nonthrombocytopenic purpura: Secondary | ICD-10-CM

## 2022-06-19 DIAGNOSIS — D2372 Other benign neoplasm of skin of left lower limb, including hip: Secondary | ICD-10-CM

## 2022-06-19 DIAGNOSIS — L814 Other melanin hyperpigmentation: Secondary | ICD-10-CM | POA: Diagnosis not present

## 2022-06-19 DIAGNOSIS — L578 Other skin changes due to chronic exposure to nonionizing radiation: Secondary | ICD-10-CM

## 2022-06-19 DIAGNOSIS — X32XXXA Exposure to sunlight, initial encounter: Secondary | ICD-10-CM

## 2022-06-19 DIAGNOSIS — D489 Neoplasm of uncertain behavior, unspecified: Secondary | ICD-10-CM

## 2022-06-19 DIAGNOSIS — D1801 Hemangioma of skin and subcutaneous tissue: Secondary | ICD-10-CM

## 2022-06-19 DIAGNOSIS — W908XXA Exposure to other nonionizing radiation, initial encounter: Secondary | ICD-10-CM

## 2022-06-19 NOTE — Progress Notes (Signed)
New Pt Visit   Subjective  Gary Schultz is a 57 y.o. male who presents for the following: Skin Cancer Screening and Full Body Skin Exam New patient would like all over check. Has some spots at left lower leg, left lower abdomen, right and left arm, back he would like checked.  The patient presents for Total-Body Skin Exam (TBSE) for skin cancer screening and mole check. The patient has spots, moles and lesions to be evaluated, some may be new or changing and the patient has concerns that these could be cancer.  The following portions of the chart were reviewed this encounter and updated as appropriate: medications, allergies, medical history  Review of Systems:  No other skin or systemic complaints except as noted in HPI or Assessment and Plan.  Objective  Well appearing patient in no apparent distress; mood and affect are within normal limits.  A full examination was performed including scalp, head, eyes, ears, nose, lips, neck, chest, axillae, abdomen, back, buttocks, bilateral upper extremities, bilateral lower extremities, hands, feet, fingers, toes, fingernails, and toenails. All findings within normal limits unless otherwise noted below.   Relevant physical exam findings are noted in the Assessment and Plan.  left lower abdomen x 1 Erythematous stuck-on, waxy papule or plaque  left anterior waistline 0.7 cm irregular brown macule  right mid dorsal forearm 0.2 cm blue papule    Assessment & Plan   LENTIGINES, SEBORRHEIC KERATOSES, HEMANGIOMAS - Benign normal skin lesions - Benign-appearing - Call for any changes  Purpura - Chronic; persistent and recurrent.  Treatable, but not curable. - Violaceous macules and patches - Benign - Related to trauma, age, sun damage and/or use of blood thinners, chronic use of topical and/or oral steroids - Observe - Can use OTC arnica containing moisturizer such as Dermend Bruise Formula if desired - Call for worsening or other  concerns  MELANOCYTIC NEVI - Tan-brown and/or pink-flesh-colored symmetric macules and papules - Benign appearing on exam today - Observation - Call clinic for new or changing moles - Recommend daily use of broad spectrum spf 30+ sunscreen to sun-exposed areas.   DERMATOFIBROMA Left calf Exam: Firm pink/brown papulenodule with dimple sign. Treatment Plan: A dermatofibroma is a benign growth possibly related to trauma, such as an insect bite, cut from shaving, or inflamed acne-type bump.  Treatment options to remove include shave or excision with resulting scar and risk of recurrence.  Since benign-appearing and not bothersome, will observe for now.   ACTINIC DAMAGE - Chronic condition, secondary to cumulative UV/sun exposure - diffuse scaly erythematous macules with underlying dyspigmentation - Recommend daily broad spectrum sunscreen SPF 30+ to sun-exposed areas, reapply every 2 hours as needed.  - Staying in the shade or wearing long sleeves, sun glasses (UVA+UVB protection) and wide brim hats (4-inch brim around the entire circumference of the hat) are also recommended for sun protection.  - Call for new or changing lesions.  SKIN CANCER SCREENING PERFORMED TODAY.  Inflamed seborrheic keratosis left lower abdomen x 1  Symptomatic, irritating, patient would like treated.  Destruction of lesion - left lower abdomen x 1 Complexity: simple   Destruction method: cryotherapy   Informed consent: discussed and consent obtained   Timeout:  patient name, date of birth, surgical site, and procedure verified Lesion destroyed using liquid nitrogen: Yes   Region frozen until ice ball extended beyond lesion: Yes   Outcome: patient tolerated procedure well with no complications   Post-procedure details: wound care instructions given  Neoplasm of uncertain behavior (2) left anterior waistline  Epidermal / dermal shaving  Lesion diameter (cm):  0.7 Informed consent: discussed and  consent obtained   Timeout: patient name, date of birth, surgical site, and procedure verified   Procedure prep:  Patient was prepped and draped in usual sterile fashion Prep type:  Isopropyl alcohol Anesthesia: the lesion was anesthetized in a standard fashion   Anesthetic:  1% lidocaine w/ epinephrine 1-100,000 buffered w/ 8.4% NaHCO3 Instrument used: flexible razor blade   Hemostasis achieved with: pressure, aluminum chloride and electrodesiccation   Outcome: patient tolerated procedure well   Post-procedure details: sterile dressing applied and wound care instructions given   Dressing type: bandage and petrolatum    Specimen 1 - Surgical pathology Differential Diagnosis: Irregular nevus vs dysplastic  Check Margins: No  right mid dorsal forearm  Epidermal / dermal shaving  Lesion diameter (cm):  0.2 Informed consent: discussed and consent obtained   Timeout: patient name, date of birth, surgical site, and procedure verified   Procedure prep:  Patient was prepped and draped in usual sterile fashion Prep type:  Isopropyl alcohol Anesthesia: the lesion was anesthetized in a standard fashion   Anesthetic:  1% lidocaine w/ epinephrine 1-100,000 buffered w/ 8.4% NaHCO3 Instrument used: flexible razor blade   Hemostasis achieved with: pressure, aluminum chloride and electrodesiccation   Outcome: patient tolerated procedure well   Post-procedure details: sterile dressing applied and wound care instructions given   Dressing type: bandage and petrolatum    Specimen 2 - Surgical pathology Differential Diagnosis: Tattoo vs other r/o  dysplasia   Check Margins: No  Irregular Nevus vs dysplastic at left anterior waistline  [place] vs other r/o dysplasia at right mid dorsal forearm   Return for 1 yr tbse.  IAsher Muir, CMA, am acting as scribe for Armida Sans, MD.  Documentation: I have reviewed the above documentation for accuracy and completeness, and I agree with the  above.  Armida Sans, MD

## 2022-06-19 NOTE — Patient Instructions (Addendum)
Biopsy Wound Care Instructions  Leave the original bandage on for 24 hours if possible.  If the bandage becomes soaked or soiled before that time, it is OK to remove it and examine the wound.  A small amount of post-operative bleeding is normal.  If excessive bleeding occurs, remove the bandage, place gauze over the site and apply continuous pressure (no peeking) over the area for 30 minutes. If this does not work, please call our clinic as soon as possible or page your doctor if it is after hours.   Once a day, cleanse the wound with soap and water. It is fine to shower. If a thick crust develops you may use a Q-tip dipped into dilute hydrogen peroxide (mix 1:1 with water) to dissolve it.  Hydrogen peroxide can slow the healing process, so use it only as needed.    After washing, apply petroleum jelly (Vaseline) or an antibiotic ointment if your doctor prescribed one for you, followed by a bandage.    For best healing, the wound should be covered with a layer of ointment at all times. If you are not able to keep the area covered with a bandage to hold the ointment in place, this may mean re-applying the ointment several times a day.  Continue this wound care until the wound has healed and is no longer open.   Itching and mild discomfort is normal during the healing process. However, if you develop pain or severe itching, please call our office.   If you have any discomfort, you can take Tylenol (acetaminophen) or ibuprofen as directed on the bottle. (Please do not take these if you have an allergy to them or cannot take them for another reason).  Some redness, tenderness and white or yellow material in the wound is normal healing.  If the area becomes very sore and red, or develops a thick yellow-green material (pus), it may be infected; please notify us.    If you have stitches, return to clinic as directed to have the stitches removed. You will continue wound care for 2-3 days after the stitches  are removed.   Wound healing continues for up to one year following surgery. It is not unusual to experience pain in the scar from time to time during the interval.  If the pain becomes severe or the scar thickens, you should notify the office.    A slight amount of redness in a scar is expected for the first six months.  After six months, the redness will fade and the scar will soften and fade.  The color difference becomes less noticeable with time.  If there are any problems, return for a post-op surgery check at your earliest convenience.  To improve the appearance of the scar, you can use silicone scar gel, cream, or sheets (such as Mederma or Serica) every night for up to one year. These are available over the counter (without a prescription).  Please call our office at [phone] for any questions or concerns.       Seborrheic Keratosis  What causes seborrheic keratoses? Seborrheic keratoses are harmless, common skin growths that first appear during adult life.  As time goes by, more growths appear.  Some people may develop a large number of them.  Seborrheic keratoses appear on both covered and uncovered body parts.  They are not caused by sunlight.  The tendency to develop seborrheic keratoses can be inherited.  They vary in color from skin-colored to gray, brown, or even black.    They can be either smooth or have a rough, warty surface.   Seborrheic keratoses are superficial and look as if they were stuck on the skin.  Under the microscope this type of keratosis looks like layers upon layers of skin.  That is why at times the top layer may seem to fall off, but the rest of the growth remains and re-grows.    Treatment Seborrheic keratoses do not need to be treated, but can easily be removed in the office.  Seborrheic keratoses often cause symptoms when they rub on clothing or jewelry.  Lesions can be in the way of shaving.  If they become inflamed, they can cause itching, soreness,  or burning.  Removal of a seborrheic keratosis can be accomplished by freezing, burning, or surgery. If any spot bleeds, scabs, or grows rapidly, please return to have it checked, as these can be an indication of a skin cancer.  Cryotherapy Aftercare  Wash gently with soap and water everyday.   Apply Vaseline and Band-Aid daily until healed.     Melanoma ABCDEs  Melanoma is the most dangerous type of skin cancer, and is the leading cause of death from skin disease.  You are more likely to develop melanoma if you: Have light-colored skin, light-colored eyes, or red or blond hair Spend a lot of time in the sun Tan regularly, either outdoors or in a tanning bed Have had blistering sunburns, especially during childhood Have a close family member who has had a melanoma Have atypical moles or large birthmarks  Early detection of melanoma is key since treatment is typically straightforward and cure rates are extremely high if we catch it early.   The first sign of melanoma is often a change in a mole or a new dark spot.  The ABCDE system is a way of remembering the signs of melanoma.  A for asymmetry:  The two halves do not match. B for border:  The edges of the growth are irregular. C for color:  A mixture of colors are present instead of an even brown color. D for diameter:  Melanomas are usually (but not always) greater than 6mm - the size of a pencil eraser. E for evolution:  The spot keeps changing in size, shape, and color.  Please check your skin once per month between visits. You can use a small mirror in front and a large mirror behind you to keep an eye on the back side or your body.   If you see any new or changing lesions before your next follow-up, please call to schedule a visit.  Please continue daily skin protection including broad spectrum sunscreen SPF 30+ to sun-exposed areas, reapplying every 2 hours as needed when you're outdoors.   Staying in the shade or wearing  long sleeves, sun glasses (UVA+UVB protection) and wide brim hats (4-inch brim around the entire circumference of the hat) are also recommended for sun protection.    Due to recent changes in healthcare laws, you may see results of your pathology and/or laboratory studies on MyChart before the doctors have had a chance to review them. We understand that in some cases there may be results that are confusing or concerning to you. Please understand that not all results are received at the same time and often the doctors may need to interpret multiple results in order to provide you with the best plan of care or course of treatment. Therefore, we ask that you please give us 2 business days   to thoroughly review all your results before contacting the office for clarification. Should we see a critical lab result, you will be contacted sooner.   If You Need Anything After Your Visit  If you have any questions or concerns for your doctor, please call our main line at [phone] and press option 4 to reach your doctor's medical assistant. If no one answers, please leave a voicemail as directed and we will return your call as soon as possible. Messages left after 4 pm will be answered the following business day.   You may also send us a message via MyChart. We typically respond to MyChart messages within 1-2 business days.  For prescription refills, please ask your pharmacy to contact our office. Our fax number is [phone].  If you have an urgent issue when the clinic is closed that cannot wait until the next business day, you can page your doctor at the number below.    Please note that while we do our best to be available for urgent issues outside of office hours, we are not available 24/7.   If you have an urgent issue and are unable to reach us, you may choose to seek medical care at your doctor's office, retail clinic, urgent care center, or emergency room.  If you have a medical emergency, please  immediately call 911 or go to the emergency department.  Pager Numbers  - Dr. Kowalski: [phone]  - Dr. Moye: [phone]  - Dr. Stewart: [phone]  In the event of inclement weather, please call our main line at [phone] for an update on the status of any delays or closures.  Dermatology Medication Tips: Please keep the boxes that topical medications come in in order to help keep track of the instructions about where and how to use these. Pharmacies typically print the medication instructions only on the boxes and not directly on the medication tubes.   If your medication is too expensive, please contact our office at [phone] option 4 or send us a message through MyChart.   We are unable to tell what your co-pay for medications will be in advance as this is different depending on your insurance coverage. However, we may be able to find a substitute medication at lower cost or fill out paperwork to get insurance to cover a needed medication.   If a prior authorization is required to get your medication covered by your insurance company, please allow us 1-2 business days to complete this process.  Drug prices often vary depending on where the prescription is filled and some pharmacies may offer cheaper prices.  The website www.goodrx.com contains coupons for medications through different pharmacies. The prices here do not account for what the cost may be with help from insurance (it may be cheaper with your insurance), but the website can give you the price if you did not use any insurance.  - You can print the associated coupon and take it with your prescription to the pharmacy.  - You may also stop by our office during regular business hours and pick up a GoodRx coupon card.  - If you need your prescription sent electronically to a different pharmacy, notify our office through [facility] MyChart or by phone at [phone] option 4.     Si Usted Necesita Algo Despus  de Su Visita  Tambin puede enviarnos un mensaje a travs de MyChart. Por lo general respondemos a los mensajes de MyChart en el transcurso de 1 a 2 das hbiles.    Para renovar recetas, por favor pida a su farmacia que se ponga en contacto con nuestra oficina. Nuestro nmero de fax es el [phone].  Si tiene un asunto urgente cuando la clnica est cerrada y que no puede esperar hasta el siguiente da hbil, puede llamar/localizar a su doctor(a) al nmero que aparece a continuacin.   Por favor, tenga en cuenta que aunque hacemos todo lo posible para estar disponibles para asuntos urgentes fuera del horario de oficina, no estamos disponibles las 24 horas del da, los 7 das de la semana.   Si tiene un problema urgente y no puede comunicarse con nosotros, puede optar por buscar atencin mdica  en el consultorio de su doctor(a), en una clnica privada, en un centro de atencin urgente o en una sala de emergencias.  Si tiene una emergencia mdica, por favor llame inmediatamente al 911 o vaya a la sala de emergencias.  Nmeros de bper  - Dr. Kowalski: [phone]  - Dra. Moye: [phone]  - Dra. Stewart: [phone]  En caso de inclemencias del tiempo, por favor llame a nuestra lnea principal al [phone] para una actualizacin sobre el estado de cualquier retraso o cierre.  Consejos para la medicacin en dermatologa: Por favor, guarde las cajas en las que vienen los medicamentos de uso tpico para ayudarle a seguir las instrucciones sobre dnde y cmo usarlos. Las farmacias generalmente imprimen las instrucciones del medicamento slo en las cajas y no directamente en los tubos del medicamento.   Si su medicamento es muy caro, por favor, pngase en contacto con nuestra oficina llamando al [phone] y presione la opcin 4 o envenos un mensaje a travs de MyChart.   No podemos decirle cul ser su copago por los medicamentos por adelantado ya que esto es diferente dependiendo  de la cobertura de su seguro. Sin embargo, es posible que podamos encontrar un medicamento sustituto a menor costo o llenar un formulario para que el seguro cubra el medicamento que se considera necesario.   Si se requiere una autorizacin previa para que su compaa de seguros cubra su medicamento, por favor permtanos de 1 a 2 das hbiles para completar este proceso.  Los precios de los medicamentos varan con frecuencia dependiendo del lugar de dnde se surte la receta y alguna farmacias pueden ofrecer precios ms baratos.  El sitio web www.goodrx.com tiene cupones para medicamentos de diferentes farmacias. Los precios aqu no tienen en cuenta lo que podra costar con la ayuda del seguro (puede ser ms barato con su seguro), pero el sitio web puede darle el precio si no utiliz ningn seguro.  - Puede imprimir el cupn correspondiente y llevarlo con su receta a la farmacia.  - Tambin puede pasar por nuestra oficina durante el horario de atencin regular y recoger una tarjeta de cupones de GoodRx.  - Si necesita que su receta se enve electrnicamente a una farmacia diferente, informe a nuestra oficina a travs de MyChart de [facility] o por telfono llamando al [phone] y presione la opcin 4.  

## 2022-06-25 ENCOUNTER — Encounter: Payer: Self-pay | Admitting: Dermatology

## 2022-06-26 ENCOUNTER — Other Ambulatory Visit: Payer: Self-pay | Admitting: *Deleted

## 2022-06-26 DIAGNOSIS — E291 Testicular hypofunction: Secondary | ICD-10-CM

## 2022-06-27 ENCOUNTER — Other Ambulatory Visit: Payer: 59

## 2022-06-27 DIAGNOSIS — E291 Testicular hypofunction: Secondary | ICD-10-CM

## 2022-06-27 NOTE — Addendum Note (Signed)
Addended by: Sueanne Margarita on: 06/27/2022 10:58 AM   Modules accepted: Orders

## 2022-06-27 NOTE — Addendum Note (Signed)
Addended by: Sueanne Margarita on: 06/27/2022 10:31 AM   Modules accepted: Orders

## 2022-06-28 LAB — TESTOSTERONE: Testosterone: 933 ng/dL — ABNORMAL HIGH (ref 264–916)

## 2022-06-30 ENCOUNTER — Encounter: Payer: Self-pay | Admitting: *Deleted

## 2022-07-01 ENCOUNTER — Telehealth: Payer: Self-pay

## 2022-07-01 NOTE — Telephone Encounter (Signed)
Left voicemail for patient to return my call. 

## 2022-07-01 NOTE — Telephone Encounter (Signed)
-----   Message from David C Kowalski, MD sent at 07/01/2022  1:22 PM EDT ----- Diagnosis 1. Skin , left anterior waistline SEBORRHEIC KERATOSIS, IRRITATED 2. Skin , right mid dorsal forearm LENTIGO, IRRITATED  1- benign irritated keratosis No further treatment needed 2- benign "freckle" No further treatment needed 

## 2022-07-02 ENCOUNTER — Telehealth: Payer: Self-pay

## 2022-07-02 NOTE — Telephone Encounter (Signed)
Patient informed of pathology results 

## 2022-07-02 NOTE — Telephone Encounter (Signed)
-----   Message from Deirdre Evener, MD sent at 07/01/2022  1:22 PM EDT ----- Diagnosis 1. Skin , left anterior waistline SEBORRHEIC KERATOSIS, IRRITATED 2. Skin , right mid dorsal forearm LENTIGO, IRRITATED  1- benign irritated keratosis No further treatment needed 2- benign "freckle" No further treatment needed

## 2022-07-09 ENCOUNTER — Ambulatory Visit: Payer: Self-pay
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
## 2022-07-10 ENCOUNTER — Ambulatory Visit (INDEPENDENT_AMBULATORY_CARE_PROVIDER_SITE_OTHER): Payer: 59 | Admitting: Internal Medicine

## 2022-07-10 VITALS — BP 158/80 | HR 87 | Temp 95.7°F | Wt 208.0 lb

## 2022-07-10 DIAGNOSIS — M5441 Lumbago with sciatica, right side: Secondary | ICD-10-CM | POA: Diagnosis not present

## 2022-07-10 DIAGNOSIS — M25561 Pain in right knee: Secondary | ICD-10-CM | POA: Diagnosis not present

## 2022-07-10 DIAGNOSIS — Z6827 Body mass index (BMI) 27.0-27.9, adult: Secondary | ICD-10-CM

## 2022-07-10 DIAGNOSIS — G8929 Other chronic pain: Secondary | ICD-10-CM | POA: Diagnosis not present

## 2022-07-10 DIAGNOSIS — E663 Overweight: Secondary | ICD-10-CM | POA: Diagnosis not present

## 2022-07-10 DIAGNOSIS — M25562 Pain in left knee: Secondary | ICD-10-CM

## 2022-07-10 MED ORDER — PREDNISONE 10 MG PO TABS: ORAL_TABLET | ORAL | 0 refills | Status: DC

## 2022-07-10 NOTE — Patient Instructions (Signed)
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
## 2022-07-10 NOTE — Assessment & Plan Note (Signed)
Encourage weight loss as this can help reduce joint pain 

## 2022-07-10 NOTE — Assessment & Plan Note (Signed)
He has very limited range of motion, discussed PT however he reports any amount of stretching worsens his pain Rx for Pred taper x 9 days Offered Rx for muscle relaxers however he reports he has cyclobenzaprine at the house and he may try this however it makes him very drowsy Encouraged heat He has an appointment with the spine specialist on Tuesday for reevaluation

## 2022-07-10 NOTE — Assessment & Plan Note (Signed)
Encourage weight loss as this can reduce joint pain He is following orthopedics and plans on having partial knee replacements at some point

## 2022-09-03 ENCOUNTER — Other Ambulatory Visit: Payer: Self-pay | Admitting: Family Medicine

## 2022-09-03 DIAGNOSIS — J0110 Acute frontal sinusitis, unspecified: Secondary | ICD-10-CM
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
## 2022-09-10 ENCOUNTER — Other Ambulatory Visit: Payer: Self-pay | Admitting: Urology

## 2022-09-10 DIAGNOSIS — E291 Testicular hypofunction: Secondary | ICD-10-CM

## 2022-09-27 ENCOUNTER — Other Ambulatory Visit: Payer: Self-pay | Admitting: Family Medicine

## 2022-09-27 DIAGNOSIS — J0110 Acute frontal sinusitis, unspecified: Secondary | ICD-10-CM
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
## 2022-09-29 NOTE — Telephone Encounter (Signed)
Patient needs appt for further refills.  Last OV 07/19/21

## 2022-10-01 ENCOUNTER — Other Ambulatory Visit: Payer: Self-pay | Admitting: Family Medicine

## 2022-10-01 DIAGNOSIS — I10 Essential (primary) hypertension: Secondary | ICD-10-CM

## 2022-10-01 DIAGNOSIS — R142 Eructation: Secondary | ICD-10-CM
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
## 2022-10-03 ENCOUNTER — Ambulatory Visit: Payer: Self-pay
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
## 2022-10-06 ENCOUNTER — Ambulatory Visit (INDEPENDENT_AMBULATORY_CARE_PROVIDER_SITE_OTHER): Payer: 59 | Admitting: Family Medicine

## 2022-10-06 ENCOUNTER — Encounter: Payer: Self-pay | Admitting: Family Medicine

## 2022-10-06 VITALS — BP 128/87 | HR 78 | Ht 73.0 in | Wt 200.2 lb

## 2022-10-06 DIAGNOSIS — M25561 Pain in right knee: Secondary | ICD-10-CM

## 2022-10-06 DIAGNOSIS — J0110 Acute frontal sinusitis, unspecified: Secondary | ICD-10-CM | POA: Diagnosis not present

## 2022-10-06 DIAGNOSIS — M5441 Lumbago with sciatica, right side: Secondary | ICD-10-CM | POA: Diagnosis not present

## 2022-10-06 DIAGNOSIS — H9202 Otalgia, left ear: Secondary | ICD-10-CM | POA: Diagnosis not present

## 2022-10-06 DIAGNOSIS — M25562 Pain in left knee: Secondary | ICD-10-CM

## 2022-10-06 DIAGNOSIS — G8929 Other chronic pain: Secondary | ICD-10-CM

## 2022-10-06 DIAGNOSIS — M4802 Spinal stenosis, cervical region: Secondary | ICD-10-CM

## 2022-10-06 DIAGNOSIS — I6521 Occlusion and stenosis of right carotid artery: Secondary | ICD-10-CM | POA: Diagnosis not present

## 2022-10-06 MED ORDER — AMOXICILLIN-POT CLAVULANATE 875-125 MG PO TABS
1.0000 | ORAL_TABLET | Freq: Two times a day (BID) | ORAL | 0 refills | Status: DC
Start: 2022-10-06 — End: 2022-12-03

## 2022-10-06 MED ORDER — PREDNISONE 20 MG PO TABS
ORAL_TABLET | ORAL | 0 refills | Status: DC
Start: 2022-10-06 — End: 2022-12-03

## 2022-10-06 NOTE — Patient Instructions (Addendum)
Thank you for coming to the office today.  Carotid Doppler ordered for vascular ultrasound.  Sinusitis treatment with antibiotic course, I do not see ear infection today, it could be sinusitis or gland swelling.  Flu shot whenever at pharmacy is good.  Shingles vaccine can be done at pharmacy, free as well, 2 doses, 2-6 months apart.  Please schedule a Follow-up Appointment to: Return in about 3 months (around 01/06/2023) for 3 month Annual Physical AM apt fasting lab AFTER.  If you have any other questions or concerns, please feel free to call the office or send a message through MyChart. You may also schedule an earlier appointment if necessary.  Additionally, you may be receiving a survey about your experience at our office within a few days to 1 week by e-mail or mail. We value your feedback.  Saralyn Pilar, DO [facility], [place]
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
## 2022-10-07 ENCOUNTER — Encounter: Payer: Self-pay | Admitting: Family Medicine

## 2022-10-10 ENCOUNTER — Ambulatory Visit
Admission: RE | Admit: 2022-10-10 | Discharge: 2022-10-10 | Disposition: A | Discharge status: Home / Self Care | Payer: 59 | Source: Ambulatory Visit | Attending: Family Medicine | Admitting: Family Medicine

## 2022-10-10 DIAGNOSIS — I6521 Occlusion and stenosis of right carotid artery: Secondary | ICD-10-CM | POA: Diagnosis present

## 2022-10-15 ENCOUNTER — Telehealth: Payer: Self-pay

## 2022-10-15 NOTE — Telephone Encounter (Signed)
-----   Message from Saralyn Pilar sent at 10/14/2022  6:16 PM EDT ----- Please contact patient to review the following (No MyChart Access):  Carotid artery ultrasound shows no blockage within the carotid arteries.  Saralyn Pilar, DO [facility] 10/14/2022, 6:16 PM

## 2022-10-15 NOTE — Telephone Encounter (Signed)
Patient aware of results.

## 2022-11-28 ENCOUNTER — Other Ambulatory Visit: Payer: Self-pay | Admitting: *Deleted

## 2022-11-28 DIAGNOSIS — N529 Male erectile dysfunction, unspecified: Secondary | ICD-10-CM

## 2022-11-28 DIAGNOSIS — R6882 Decreased libido: Secondary | ICD-10-CM

## 2022-11-28 DIAGNOSIS — E291 Testicular hypofunction: Secondary | ICD-10-CM

## 2022-12-01 ENCOUNTER — Other Ambulatory Visit: Payer: 59

## 2022-12-01 DIAGNOSIS — R6882 Decreased libido: Secondary | ICD-10-CM

## 2022-12-01 DIAGNOSIS — E291 Testicular hypofunction: Secondary | ICD-10-CM

## 2022-12-01 DIAGNOSIS — N529 Male erectile dysfunction, unspecified: Secondary | ICD-10-CM

## 2022-12-01 NOTE — Progress Notes (Unsigned)
12/03/2022 9:40 AM   [name] 08/15/1965 [id number]  Referring provider: Smitty Cords, DO [address] [place],  [place] [address]  Urological history: 1.  Hypogonadism -Contributing factors of age, chronic opioid use and high BMI -Testosterone level (11/2022) 275 -Hemoglobin/hematocrit (11/2022) 16.0/48.2 -Testosterone cypionate 200 mg/milliliters, 1 mL every 14 days  2. BPH with LU TS -PSA (11/2022) 0.8  3. Erectile dysfunction -Contributing factors of age, hypogonadism, chronic opioid use, high BMI, alcohol consumption, hypertension, lumbar DDD, anxiety and depression -Tadalafil 20 mg on-demand dosing  Chief Complaint  Patient presents with   Hypogonadism   HPI: Gary Schultz is a 57 y.o. male who presents today for follow up.  Previous records reviewed.   He states he has no memory and attention deficit disorder and his wife handles all his medications.  It was hard for him to stay on task during his visit.  His wife gives him his injections.  He states he notices about 8 days after receiving injection he has decreased libido, worsening erectile dysfunction and no energy.  I PSS 5/2 -he had originally put 6, but that was in regards to his erectile dysfunction.  He has no urinary complaints.  Patient denies any modifying or aggravating factors.  Patient denies any recent UTI's, gross hematuria, dysuria or suprapubic/flank pain.  Patient denies any fevers, chills, nausea or vomiting.     IPSS     Row Name 12/03/22 0800         International Prostate Symptom Score   How often have you had the sensation of not emptying your bladder? Not at All     How often have you had to urinate less than every two hours? Not at All     How often have you found you stopped and started again several times when you urinated? Less than 1 in 5 times     How often have you found it difficult to postpone urination? Less than 1 in 5 times     How often have you had a  weak urinary stream? Less than half the time     How often have you had to strain to start urination? Not at All     How many times did you typically get up at night to urinate? 1 Time     Total IPSS Score 5       Quality of Life due to urinary symptoms   If you were to spend the rest of your life with your urinary condition just the way it is now how would you feel about that? Terrible              Score:  1-7 Mild 8-19 Moderate 20-35 Severe   SHIM 14  He states for the week after he receives a testosterone injection, he has good libido, spontaneous erections and no erectile dysfunction.  He denies any pain or curvature with erections.  He also has good erections with PDE 5 inhibitors, but they have intolerable side effects.   SHIM     Row Name 12/03/22 0857         SHIM: Over the last 6 months:   How do you rate your confidence that you could get and keep an erection? Low     When you had erections with sexual stimulation, how often were your erections hard enough for penetration (entering your partner)? Sometimes (about half the time)     During sexual intercourse, how often  were you able to maintain your erection after you had penetrated (entered) your partner? Sometimes (about half the time)     During sexual intercourse, how difficult was it to maintain your erection to completion of intercourse? Difficult     When you attempted sexual intercourse, how often was it satisfactory for you? Sometimes (about half the time)       SHIM Total Score   SHIM 14              Score: 1-7 Severe ED 8-11 Moderate ED 12-16 Mild-Moderate ED 17-21 Mild ED 22-25 No ED   PMH: Past Medical History:  Diagnosis Date   Anxiety    Carpal tunnel syndrome    bilateral   DDD (degenerative disc disease), lumbar    Generalized headaches    from neck injury   GERD (gastroesophageal reflux disease)    Hypertension    Neck pain    Neuromuscular disorder (HCC)    Varicose veins      Surgical History: Past Surgical History:  Procedure Laterality Date   BACK SURGERY  2006   L4-L5 herniated disc repair   CARPAL TUNNEL RELEASE Right 12/22/2019   Procedure: CARPAL TUNNEL RELEASE ENDOSCOPIC;  Surgeon: Christena Flake, MD;  Location: [facility];  Service: Orthopedics;  Laterality: Right;   CARPAL TUNNEL RELEASE Left 02/29/2020   Procedure: CARPAL TUNNEL RELEASE ENDOSCOPIC;  Surgeon: Christena Flake, MD;  Location: [facility];  Service: Orthopedics;  Laterality: Left;   CLAVICLE SURGERY Right    COLONOSCOPY WITH PROPOFOL N/A 06/08/2020   Procedure: COLONOSCOPY WITH PROPOFOL;  Surgeon: Midge Minium, MD;  Location: [facility];  Service: Endoscopy;  Laterality: N/A;   FRACTURE SURGERY     KNEE ARTHROSCOPY WITH MEDIAL MENISECTOMY Left 09/28/2018   Procedure: KNEE ARTHROSCOPY WITH DEBRIDEMENT, abrasion chondroplasty of patella, PARTIAL MEDIAL MENISCECTOMY;  Surgeon: Christena Flake, MD;  Location: [facility];  Service: Orthopedics;  Laterality: Left;   KNEE ARTHROSCOPY WITH MEDIAL MENISECTOMY Right 12/07/2018   Procedure: RIGHT KNEE ARTHROSCOPY WITH DEBRIDEMENT, REPAIR, PARTIAL MEDIAL MENISECTOMY;  Surgeon: Christena Flake, MD;  Location: [facility];  Service: Orthopedics;  Laterality: Right;   REPAIR OF PERONEUS BREVIS TENDON Left 03/29/2019   Procedure: Debridement of chronic peroneus brevis tendinopathy with reconstruction of calcaneofibular and anterior talofibular ligaments;  Surgeon: Christena Flake, MD;  Location: [facility];  Service: Orthopedics;  Laterality: Left;   SHOULDER SURGERY Right 1992    Home Medications:  Allergies as of 12/03/2022       Reactions   Tylenol [acetaminophen] Hives, Itching, Rash, Other (See Comments)   Stomach pain        Medication List        Accurate as of December 03, 2022  9:40 AM. If you have any questions, ask your nurse or doctor.          STOP taking these medications    amoxicillin-clavulanate 875-125 MG tablet Commonly known as:  AUGMENTIN Stopped by: Caprisha Bridgett   predniSONE 20 MG tablet Commonly known as: DELTASONE Stopped by: Katty Fretwell       TAKE these medications    diclofenac Sodium 1 % Gel Commonly known as: VOLTAREN SMARTSIG:2-4 Gram(s) Topical 2-4 Times Daily   fluticasone 50 MCG/ACT nasal spray Commonly known as: FLONASE PLACE 2 SPRAYS IN BOTH NOSTRILS DAILY FOR 4-6 WEEKS THEN STOP AND USE SEASONALLY OR AS NEEDED.   gabapentin 300 MG capsule Commonly known as: NEURONTIN Take 300-900 mg by mouth 3 (  three) times daily as needed (pain).   lidocaine 5 % Commonly known as: LIDODERM Place 1-2 patches onto the skin daily as needed (back pain).   lisinopril 30 MG tablet Commonly known as: ZESTRIL TAKE 1 TABLET BY MOUTH EVERY DAY   omeprazole 20 MG capsule Commonly known as: PRILOSEC TAKE 1 CAPSULE (20 MG TOTAL) BY MOUTH DAILY BEFORE BREAKFAST. EXTRA REFILLS ON FILE   Oxycodone HCl 20 MG Tabs Take 20 mg by mouth 5 (five) times daily as needed (pain).   tadalafil 20 MG tablet Commonly known as: CIALIS TAKE ONE-HALF TO ONE TABLET BY MOUTH EVERY OTHER DAY AS NEEDED FOR ERECTILE DYSFUNCTION   testosterone cypionate 200 MG/ML injection Commonly known as: DEPOTESTOSTERONE CYPIONATE Inject 1 cc every 7 days What changed: See the new instructions. Changed by: Michiel Cowboy        Allergies:  Allergies  Allergen Reactions   Tylenol [Acetaminophen] Hives, Itching, Rash and Other (See Comments)    Stomach pain    Family History: Family History  Problem Relation Age of Onset   Cancer Mother    Varicose Veins Mother    Cancer Father    Stroke Father    Hypertension Father    Colon cancer Father     Social History:  reports that he has never smoked. He has never used smokeless tobacco. He reports current alcohol use of about 3.0 standard drinks of alcohol per week. He reports that he does not use drugs.  ROS: Pertinent ROS in HPI  Physical Exam: BP (!) 157/82   Pulse  82   Ht 6\' 1"  (1.854 m)   Wt 197 lb (89.4 kg)   BMI 25.99 kg/m   Constitutional:  Well nourished. Alert and oriented, No acute distress. HEENT: [place] AT, moist mucus membranes.  Trachea midline Cardiovascular: No clubbing, cyanosis, or edema. Respiratory: Normal respiratory effort, no increased work of breathing. Neurologic: Grossly intact, no focal deficits, moving all 4 extremities. Psychiatric: Normal mood and affect.  Laboratory Data: Results for orders placed or performed in visit on 12/01/22  Testosterone  Result Value Ref Range   Testosterone 275 264 - 916 ng/dL  PSA  Result Value Ref Range   Prostate Specific Ag, Serum 0.8 0.0 - 4.0 ng/mL  Hemoglobin and hematocrit, blood  Result Value Ref Range   Hemoglobin 16.0 13.0 - 17.7 g/dL   Hematocrit 81.1 91.4 - 51.0 %  I have reviewed the labs.   Pertinent Imaging: N/A  Assessment & Plan:    1. Testosterone deficiency  -testosterone levels are subtherapeutic -H & H WNL -Increase testosterone cypionate to 1 cc every 7 days -We will need to check his testosterone, hemoglobin and hematocrit 3 to 4 days after his fourth injection  2. BPH with LUTS -PSA stable --continue conservative management, avoiding bladder irritants and timed voiding's  3. Erectile dysfunction:    - We discussed trying a  different PDE5 inhibitor, intra-urethral suppositories, intracavernous vasoactive drug injection therapy, vacuum erection devices and penile prosthesis implantation  - he is not interested in other treatment modalities  -Will reassess once we get testosterone at therapeutic levels  Return in about 1 month (around 01/03/2023) for testosterone, hemoglobin and hematocrit .  These notes generated with voice recognition software. I apologize for typographical errors.  Cloretta Ned  [facility] [address]  [address] [place], [place] [address] [phone]

## 2022-12-02 LAB — HEMOGLOBIN AND HEMATOCRIT, BLOOD
Hematocrit: 48.2 % (ref 37.5–51.0)
Hemoglobin: 16 g/dL (ref 13.0–17.7)

## 2022-12-02 LAB — TESTOSTERONE: Testosterone: 275 ng/dL (ref 264–916)

## 2022-12-02 LAB — PSA: Prostate Specific Ag, Serum: 0.8 ng/mL (ref 0.0–4.0)

## 2022-12-03 ENCOUNTER — Encounter: Payer: Self-pay | Admitting: Urology

## 2022-12-03 ENCOUNTER — Ambulatory Visit (INDEPENDENT_AMBULATORY_CARE_PROVIDER_SITE_OTHER): Payer: 59 | Admitting: Urology

## 2022-12-03 VITALS — BP 157/82 | HR 82 | Ht 73.0 in | Wt 197.0 lb

## 2022-12-03 DIAGNOSIS — E291 Testicular hypofunction: Secondary | ICD-10-CM

## 2022-12-03 DIAGNOSIS — N138 Other obstructive and reflux uropathy: Secondary | ICD-10-CM

## 2022-12-03 DIAGNOSIS — N401 Enlarged prostate with lower urinary tract symptoms: Secondary | ICD-10-CM

## 2022-12-03 DIAGNOSIS — N529 Male erectile dysfunction, unspecified: Secondary | ICD-10-CM

## 2022-12-03 MED ORDER — TESTOSTERONE CYPIONATE 200 MG/ML IM SOLN: INTRAMUSCULAR | 0 refills | Status: DC

## 2022-12-23 ENCOUNTER — Other Ambulatory Visit: Payer: Self-pay | Admitting: Family Medicine

## 2022-12-23 ENCOUNTER — Other Ambulatory Visit: Payer: Self-pay | Admitting: Internal Medicine

## 2022-12-23 DIAGNOSIS — J0110 Acute frontal sinusitis, unspecified: Secondary | ICD-10-CM

## 2022-12-23 DIAGNOSIS — R142 Eructation: Secondary | ICD-10-CM
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
## 2023-01-01 ENCOUNTER — Other Ambulatory Visit: Payer: 59

## 2023-01-01 ENCOUNTER — Telehealth: Payer: Self-pay | Admitting: Urology

## 2023-01-01 ENCOUNTER — Other Ambulatory Visit: Payer: Self-pay | Admitting: *Deleted

## 2023-01-01 ENCOUNTER — Other Ambulatory Visit: Payer: Self-pay

## 2023-01-01 DIAGNOSIS — E291 Testicular hypofunction: Secondary | ICD-10-CM

## 2023-01-01 MED ORDER — "BD LUER-LOK SYRINGE 21G X 1-1/2"" 3 ML MISC": 0 refills | Status: DC

## 2023-01-01 MED ORDER — "BD SAFETYGLIDE NEEDLE 18G X 1-1/2"" MISC": 0 refills | Status: DC

## 2023-01-01 NOTE — Telephone Encounter (Signed)
Patient needs rx for 18 and 23 gage needles to be sent to [facility].

## 2023-01-01 NOTE — Telephone Encounter (Signed)
Needle sent.

## 2023-01-02 ENCOUNTER — Telehealth: Payer: Self-pay | Admitting: Urology

## 2023-01-02 LAB — TESTOSTERONE: Testosterone: 1212 ng/dL — ABNORMAL HIGH (ref 264–916)

## 2023-01-02 LAB — HEMOGLOBIN AND HEMATOCRIT, BLOOD
Hematocrit: 47.9 % (ref 37.5–51.0)
Hemoglobin: 15.8 g/dL (ref 13.0–17.7)

## 2023-01-02 NOTE — Telephone Encounter (Signed)
Pt was supposed to take shot 3 days before blood work and he actually did his shot 1 day before.  He said this may have messed up his lab results.

## 2023-01-03 ENCOUNTER — Other Ambulatory Visit: Payer: Self-pay | Admitting: Family Medicine

## 2023-01-03 DIAGNOSIS — I10 Essential (primary) hypertension: Secondary | ICD-10-CM
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
## 2023-01-07 ENCOUNTER — Ambulatory Visit (INDEPENDENT_AMBULATORY_CARE_PROVIDER_SITE_OTHER): Payer: 59 | Admitting: Family Medicine

## 2023-01-07 ENCOUNTER — Other Ambulatory Visit: Payer: Self-pay

## 2023-01-07 VITALS — BP 132/72 | Ht 73.0 in | Wt 204.0 lb

## 2023-01-07 DIAGNOSIS — R142 Eructation: Secondary | ICD-10-CM

## 2023-01-07 DIAGNOSIS — Z0000 Encounter for general adult medical examination without abnormal findings: Secondary | ICD-10-CM | POA: Diagnosis not present

## 2023-01-07 DIAGNOSIS — E7800 Pure hypercholesterolemia, unspecified: Secondary | ICD-10-CM

## 2023-01-07 DIAGNOSIS — R7309 Other abnormal glucose: Secondary | ICD-10-CM

## 2023-01-07 DIAGNOSIS — R351 Nocturia: Secondary | ICD-10-CM

## 2023-01-07 DIAGNOSIS — M5136 Other intervertebral disc degeneration, lumbar region with discogenic back pain only: Secondary | ICD-10-CM | POA: Diagnosis not present

## 2023-01-07 DIAGNOSIS — M5030 Other cervical disc degeneration, unspecified cervical region: Secondary | ICD-10-CM

## 2023-01-07 DIAGNOSIS — I10 Essential (primary) hypertension: Secondary | ICD-10-CM

## 2023-01-07 DIAGNOSIS — N401 Enlarged prostate with lower urinary tract symptoms: Secondary | ICD-10-CM

## 2023-01-07 DIAGNOSIS — E291 Testicular hypofunction: Secondary | ICD-10-CM

## 2023-01-07 DIAGNOSIS — J0110 Acute frontal sinusitis, unspecified: Secondary | ICD-10-CM

## 2023-01-07 MED ORDER — OMEPRAZOLE 20 MG PO CPDR: 20.0000 mg | DELAYED_RELEASE_CAPSULE | Freq: Every day | ORAL | 11 refills | Status: DC

## 2023-01-07 MED ORDER — LISINOPRIL 30 MG PO TABS: 30.0000 mg | ORAL_TABLET | Freq: Every day | ORAL | 11 refills | Status: DC

## 2023-01-07 MED ORDER — PREDNISONE 10 MG PO TABS
ORAL_TABLET | ORAL | 0 refills | Status: DC
Start: 2023-01-07 — End: 2023-03-26

## 2023-01-07 MED ORDER — AMOXICILLIN-POT CLAVULANATE 875-125 MG PO TABS: 1.0000 | ORAL_TABLET | Freq: Two times a day (BID) | ORAL | 0 refills | Status: DC

## 2023-01-07 NOTE — Progress Notes (Signed)
Subjective:    Patient ID: Gary Schultz, male    DOB: Jul 23, 1965, 57 y.o.   MRN: [id number]  Gary Schultz is a 56 y.o. male presenting on 01/07/2023 for Annual Exam   HPI  Discussed the use of AI scribe software for clinical note transcription with the patient, who gave verbal consent to proceed.  Advanced Osteoarthritis Bilateral knees History of Meniscus Tear Hypertension    PTSD, GAD, ADHD, Memory Loss [facility]   The patient, with a history of multiple surgeries including carpal tunnel release, knee surgeries, and ankle reconstruction, presents for an annual check-up. He reports no new health issues since the last visit. The patient has been experiencing sinus issues for the past six days, with constant drainage and earache. He has been self-managing with Sudafed and nasal spray, but is concerned about the symptoms progressing into an infection.  The patient also mentions a history of knee issues, with previous arthroscopic surgery to remove bone spurs providing no relief. He is considering a partial knee replacement, as recommended by his previous Careers adviser.  In terms of medication, the patient is currently on Lisinopril, which he picks up monthly, and is considering an arthritis cream prescribed by his pain management specialist. He has also been prescribed Augmentin in the past for sinus issues.  The patient declined a flu shot, expressing concern about potential side effects. He is up-to-date with colon screening, with the last colonoscopy performed in 2022 and the next one due in 2032.      Pending labs now will draw after visit.  Hypertension BP has been controlled.  Health Maintenance:  Last Colonoscopy 05/2020, repeat 10 years 2032     01/07/2023   10:09 AM 10/06/2022   11:33 AM 04/29/2022    8:36 AM  Depression screen PHQ 2/9  Decreased Interest 2 3 1   Down, Depressed, Hopeless 1 2 1   PHQ - 2 Score 3 5 2   Altered sleeping 1 1 1    Tired, decreased energy 1 3 2   Change in appetite 1 2 2   Feeling bad or failure about yourself  1 1 2   Trouble concentrating 1 2 2   Moving slowly or fidgety/restless 0 1 1  Suicidal thoughts 0 0 0  PHQ-9 Score 8 15 12   Difficult doing work/chores Somewhat difficult Extremely dIfficult Extremely dIfficult       01/07/2023   10:10 AM 10/06/2022   11:33 AM 04/29/2022    8:37 AM 04/16/2022    9:54 AM  GAD 7 : Generalized Anxiety Score  Nervous, Anxious, on Edge 1 2 1 3   Control/stop worrying 1 1 1 3   Worry too much - different things 1 1 1 3   Trouble relaxing 1 2 1 3   Restless 1 1 1 3   Easily annoyed or irritable 1 1 1 3   Afraid - awful might happen 1 0 0 2  Total GAD 7 Score 7 8 6 20   Anxiety Difficulty Somewhat difficult Not difficult at all Somewhat difficult Somewhat difficult     Past Medical History:  Diagnosis Date   Anxiety    Carpal tunnel syndrome    bilateral   DDD (degenerative disc disease), lumbar    Generalized headaches    from neck injury   GERD (gastroesophageal reflux disease)    Hypertension    Neck pain    Neuromuscular disorder (HCC)    Varicose veins    Past Surgical History:  Procedure Laterality Date   BACK  SURGERY  2006   L4-L5 herniated disc repair   CARPAL TUNNEL RELEASE Right 12/22/2019   Procedure: CARPAL TUNNEL RELEASE ENDOSCOPIC;  Surgeon: Christena Flake, MD;  Location: [facility];  Service: Orthopedics;  Laterality: Right;   CARPAL TUNNEL RELEASE Left 02/29/2020   Procedure: CARPAL TUNNEL RELEASE ENDOSCOPIC;  Surgeon: Christena Flake, MD;  Location: [facility];  Service: Orthopedics;  Laterality: Left;   CLAVICLE SURGERY Right    COLONOSCOPY WITH PROPOFOL N/A 06/08/2020   Procedure: COLONOSCOPY WITH PROPOFOL;  Surgeon: Midge Minium, MD;  Location: [facility];  Service: Endoscopy;  Laterality: N/A;   FRACTURE SURGERY     KNEE ARTHROSCOPY WITH MEDIAL MENISECTOMY Left 09/28/2018   Procedure: KNEE ARTHROSCOPY WITH DEBRIDEMENT, abrasion  chondroplasty of patella, PARTIAL MEDIAL MENISCECTOMY;  Surgeon: Christena Flake, MD;  Location: [facility];  Service: Orthopedics;  Laterality: Left;   KNEE ARTHROSCOPY WITH MEDIAL MENISECTOMY Right 12/07/2018   Procedure: RIGHT KNEE ARTHROSCOPY WITH DEBRIDEMENT, REPAIR, PARTIAL MEDIAL MENISECTOMY;  Surgeon: Christena Flake, MD;  Location: [facility];  Service: Orthopedics;  Laterality: Right;   REPAIR OF PERONEUS BREVIS TENDON Left 03/29/2019   Procedure: Debridement of chronic peroneus brevis tendinopathy with reconstruction of calcaneofibular and anterior talofibular ligaments;  Surgeon: Christena Flake, MD;  Location: [facility];  Service: Orthopedics;  Laterality: Left;   SHOULDER SURGERY Right 1992   Social History   Socioeconomic History   Marital status: Married    Spouse name: Toniann Fail   Number of children: Not on file   Years of education: Not on file   Highest education level: 10th grade  Occupational History   Not on file  Tobacco Use   Smoking status: Never   Smokeless tobacco: Never  Vaping Use   Vaping status: Never Used  Substance and Sexual Activity   Alcohol use: Yes    Alcohol/week: 3.0 standard drinks of alcohol    Types: 3 Glasses of wine per week    Comment: occassional   Drug use: No   Sexual activity: Yes  Other Topics Concern   Not on file  Social History Narrative   Not on file   Social Determinants of Health   Financial Resource Strain: Medium Risk (01/07/2023)   Overall Financial Resource Strain (CARDIA)    Difficulty of Paying Living Expenses: Somewhat hard  Food Insecurity: Food Insecurity Present (01/07/2023)   Hunger Vital Sign    Worried About Running Out of Food in the Last Year: Sometimes true    Ran Out of Food in the Last Year: Never true  Transportation Needs: No Transportation Needs (01/07/2023)   PRAPARE - Administrator, Civil Service (Medical): No    Lack of Transportation (Non-Medical): No  Physical Activity: Unknown (01/07/2023)    Exercise Vital Sign    Days of Exercise per Week: 0 days    Minutes of Exercise per Session: Not on file  Stress: No Stress Concern Present (01/07/2023)   [employer] of Occupational Health - Occupational Stress Questionnaire    Feeling of Stress : Only a little  Social Connections: Moderately Integrated (01/07/2023)   Social Connection and Isolation Panel [NHANES]    Frequency of Communication with Friends and Family: Three times a week    Frequency of Social Gatherings with Friends and Family: Once a week    Attends Religious Services: 1 to 4 times per year    Active Member of [employer] or Organizations: No    Attends Banker Meetings:  Not on file    Marital Status: Married  Intimate Partner Violence: Not At Risk (08/14/2022)   Received from [facility]   HITS    Over the last 12 months how often did your partner physically hurt you?: Never    Over the last 12 months how often did your partner insult you or talk down to you?: Never    Over the last 12 months how often did your partner threaten you with physical harm?: Never    Over the last 12 months how often did your partner scream or curse at you?: Never   Family History  Problem Relation Age of Onset   Cancer Mother    Varicose Veins Mother    Cancer Father    Stroke Father    Hypertension Father    Colon cancer Father    Current Outpatient Medications on File Prior to Visit  Medication Sig   Ascorbic Acid (VITAMIN C PO) Take by mouth.   Cholecalciferol (D3 PO) Take by mouth.   Cyanocobalamin (B-12 PO) Take by mouth.   diclofenac Sodium (VOLTAREN) 1 % GEL SMARTSIG:2-4 Gram(s) Topical 2-4 Times Daily   fluticasone (FLONASE) 50 MCG/ACT nasal spray PLACE 2 SPRAYS IN BOTH NOSTRILS DAILY FOR 4-6 WEEKS THEN STOP AND USE SEASONALLY OR AS NEEDED.   gabapentin (NEURONTIN) 300 MG capsule Take 300-900 mg by mouth 3 (three) times daily as needed (pain).   lidocaine (LIDODERM) 5 % Place 1-2 patches onto the skin daily  as needed (back pain).   NEEDLE, DISP, 18 G (BD SAFETYGLIDE NEEDLE) 18G X 1-1/2" MISC This needle is to pull up medication   Oxycodone HCl 20 MG TABS Take 20 mg by mouth 5 (five) times daily as needed (pain).   SYRINGE-NEEDLE, DISP, 3 ML (B-D 3CC LUER-LOK SYR 21GX1-1/2) 21G X 1-1/2" 3 ML MISC Use this needle to injection   tadalafil (CIALIS) 20 MG tablet TAKE ONE-HALF TO ONE TABLET BY MOUTH EVERY OTHER DAY AS NEEDED FOR ERECTILE DYSFUNCTION   testosterone cypionate (DEPOTESTOSTERONE CYPIONATE) 200 MG/ML injection Inject 1 cc every 7 days   No current facility-administered medications on file prior to visit.    Review of Systems  Constitutional:  Negative for activity change, appetite change, chills, diaphoresis, fatigue and fever.  HENT:  Negative for congestion and hearing loss.   Eyes:  Negative for visual disturbance.  Respiratory:  Negative for cough, chest tightness, shortness of breath and wheezing.   Cardiovascular:  Negative for chest pain, palpitations and leg swelling.  Gastrointestinal:  Negative for abdominal pain, constipation, diarrhea, nausea and vomiting.  Genitourinary:  Negative for dysuria, frequency and hematuria.  Musculoskeletal:  Negative for arthralgias and neck pain.  Skin:  Negative for rash.  Neurological:  Negative for dizziness, weakness, light-headedness, numbness and headaches.  Hematological:  Negative for adenopathy.  Psychiatric/Behavioral:  Negative for behavioral problems, dysphoric mood and sleep disturbance.    Per HPI unless specifically indicated above     Objective:    BP 132/72   Ht 6\' 1"  (1.854 m)   Wt 204 lb (92.5 kg)   BMI 26.91 kg/m   Wt Readings from Last 3 Encounters:  01/07/23 204 lb (92.5 kg)  12/03/22 197 lb (89.4 kg)  10/06/22 200 lb 3.2 oz (90.8 kg)    Physical Exam Vitals and nursing note reviewed.  Constitutional:      General: He is not in acute distress.    Appearance: He is well-developed. He is not diaphoretic.  Comments: Well-appearing, comfortable, cooperative  HENT:     Head: Normocephalic and atraumatic.  Eyes:     General:        Right eye: No discharge.        Left eye: No discharge.     Conjunctiva/sclera: Conjunctivae normal.     Pupils: Pupils are equal, round, and reactive to light.  Neck:     Thyroid: No thyromegaly.     Vascular: No carotid bruit.  Cardiovascular:     Rate and Rhythm: Normal rate and regular rhythm.     Pulses: Normal pulses.     Heart sounds: Normal heart sounds. No murmur heard. Pulmonary:     Effort: Pulmonary effort is normal. No respiratory distress.     Breath sounds: Normal breath sounds. No wheezing or rales.  Abdominal:     General: Bowel sounds are normal. There is no distension.     Palpations: Abdomen is soft. There is no mass.     Tenderness: There is no abdominal tenderness.  Musculoskeletal:        General: No tenderness. Normal range of motion.     Cervical back: Normal range of motion and neck supple.     Right lower leg: No edema.     Left lower leg: No edema.     Comments: Upper / Lower Extremities: - Normal muscle tone, strength bilateral upper extremities 5/5, lower extremities 5/5  Lymphadenopathy:     Cervical: No cervical adenopathy.  Skin:    General: Skin is warm and dry.     Findings: No erythema or rash.  Neurological:     Mental Status: He is alert and oriented to person, place, and time.     Comments: Distal sensation intact to light touch all extremities  Psychiatric:        Mood and Affect: Mood normal.        Behavior: Behavior normal.        Thought Content: Thought content normal.     Comments: Well groomed, good eye contact, normal speech and thoughts     Results for orders placed or performed in visit on 01/01/23  Hemoglobin and Hematocrit, Blood  Result Value Ref Range   Hemoglobin 15.8 13.0 - 17.7 g/dL   Hematocrit 82.9 56.2 - 51.0 %  Testosterone  Result Value Ref Range   Testosterone 1,212 (H) 264 - 916  ng/dL      Assessment & Plan:   Problem List Items Addressed This Visit     BPH associated with nocturia   Relevant Orders   PSA   DDD (degenerative disc disease), lumbar   Relevant Medications   predniSONE (DELTASONE) 10 MG tablet   Other Visit Diagnoses     Annual physical exam    -  Primary   Relevant Orders   Hemoglobin A1c   Lipid panel   COMPLETE METABOLIC PANEL WITH GFR   PSA   TSH   DDD (degenerative disc disease), cervical       Relevant Medications   predniSONE (DELTASONE) 10 MG tablet   Essential hypertension       Relevant Medications   lisinopril (ZESTRIL) 30 MG tablet   Other Relevant Orders   COMPLETE METABOLIC PANEL WITH GFR   Elevated LDL cholesterol level       Relevant Orders   Lipid panel   TSH   Abnormal glucose       Relevant Orders   Hemoglobin A1c   Belching  Relevant Medications   omeprazole (PRILOSEC) 20 MG capsule   Acute non-recurrent frontal sinusitis       Relevant Medications   amoxicillin-clavulanate (AUGMENTIN) 875-125 MG tablet   predniSONE (DELTASONE) 10 MG tablet        Updated Health Maintenance information Reviewed recent lab results with patient Encouraged improvement to lifestyle with diet and exercise Goal of weight loss   Sinusitis Complaints of constant drainage and earache for the past week. Currently self-managing with Sudafed. -Order Augmentin for sinusitis. -Optional Prednisone as a backup plan if symptoms do not improve in a few days.  Hypertension Currently managed with Lisinopril. -Refill Lisinopril prescription at [facility].  General Health Maintenance -Colonoscopy screening due in 2032. -Declined flu shot. -Order annual blood work including glucose, prostate, thyroid, cholesterol, and chemistry panel.  Knee Pain Considering partial knee replacement due to persistent pain. Previous surgeries performed by Dr. Joice Lofts. -Advise patient to consult with Dr. Joice Lofts regarding partial knee  replacement.  Follow-up Schedule annual visit for next year with blood work to be done a week prior to the visit.         Orders Placed This Encounter  Procedures   Hemoglobin A1c   Lipid panel    Order Specific Question:   Has the patient fasted?    Answer:   Yes   COMPLETE METABOLIC PANEL WITH GFR   PSA   TSH    Meds ordered this encounter  Medications   lisinopril (ZESTRIL) 30 MG tablet    Sig: Take 1 tablet (30 mg total) by mouth daily.    Dispense:  30 tablet    Refill:  11    Add refills   omeprazole (PRILOSEC) 20 MG capsule    Sig: Take 1 capsule (20 mg total) by mouth daily before breakfast. Extra refills on file    Dispense:  30 capsule    Refill:  11   amoxicillin-clavulanate (AUGMENTIN) 875-125 MG tablet    Sig: Take 1 tablet by mouth 2 (two) times daily.    Dispense:  20 tablet    Refill:  0   predniSONE (DELTASONE) 10 MG tablet    Sig: Take 6 tabs with breakfast Day 1, 5 tabs Day 2, 4 tabs Day 3, 3 tabs Day 4, 2 tabs Day 5, 1 tab Day 6.    Dispense:  21 tablet    Refill:  0     Follow up plan: Return in about 1 year (around 01/07/2024) for 1 year fasting lab > 1 week later Annual Physical.  Saralyn Pilar, DO [facility] 01/07/2023, 10:28 AM

## 2023-01-07 NOTE — Patient Instructions (Addendum)
Thank you for coming to the office today.  Start antibiotic  Labs today  No vaccines  Colonoscopy 2032.  DUE for FASTING BLOOD WORK (no food or drink after midnight before the lab appointment, only water or coffee without cream/sugar on the morning of)  SCHEDULE "Lab Only" visit in the morning at the clinic for lab draw in 1 YEAR  - Make sure Lab Only appointment is at about 1 week before your next appointment, so that results will be available  For Lab Results, once available within 2-3 days of blood draw, you can can log in to MyChart online to view your results and a brief explanation. Also, we can discuss results at next follow-up visit.   Please schedule a Follow-up Appointment to: Return in about 1 year (around 01/07/2024) for 1 year fasting lab > 1 week later Annual Physical.  If you have any other questions or concerns, please feel free to call the office or send a message through MyChart. You may also schedule an earlier appointment if necessary.  Additionally, you may be receiving a survey about your experience at our office within a few days to 1 week by e-mail or mail. We value your feedback.  Saralyn Pilar, DO [facility], [place]

## 2023-01-08 ENCOUNTER — Other Ambulatory Visit: Payer: 59

## 2023-01-08 DIAGNOSIS — E291 Testicular hypofunction: Secondary | ICD-10-CM

## 2023-01-08 LAB — LIPID PANEL
Cholesterol: 171 mg/dL (ref <200)
HDL: 56 mg/dL (ref >=40)
LDL Cholesterol (Calc): 98 mg/dL
Non-HDL Cholesterol (Calc): 115 mg/dL (ref <130)
Total CHOL/HDL Ratio: 3.1 (calc) (ref <5.0)
Triglycerides: 81 mg/dL (ref <150)

## 2023-01-08 LAB — COMPLETE METABOLIC PANEL WITH GFR
AG Ratio: 2 (calc) (ref 1.0–2.5)
ALT: 12 U/L (ref 9–46)
AST: 15 U/L (ref 10–35)
Albumin: 4.3 g/dL (ref 3.6–5.1)
Alkaline phosphatase (APISO): 86 U/L (ref 35–144)
BUN: 17 mg/dL (ref 7–25)
CO2: 27 mmol/L (ref 20–32)
Calcium: 9.2 mg/dL (ref 8.6–10.3)
Chloride: 102 mmol/L (ref 98–110)
Creat: 1.08 mg/dL (ref 0.70–1.30)
Globulin: 2.1 g/dL (ref 1.9–3.7)
Glucose, Bld: 85 mg/dL (ref 65–139)
Potassium: 4.2 mmol/L (ref 3.5–5.3)
Sodium: 138 mmol/L (ref 135–146)
Total Bilirubin: 0.9 mg/dL (ref 0.2–1.2)
Total Protein: 6.4 g/dL (ref 6.1–8.1)
eGFR: 81 mL/min/{1.73_m2} (ref >=60)

## 2023-01-08 LAB — PSA: PSA: 0.73 ng/mL (ref <=4.00)

## 2023-01-08 LAB — HEMOGLOBIN A1C
Hgb A1c MFr Bld: 5 %{Hb} (ref <5.7)
Mean Plasma Glucose: 97 mg/dL
eAG (mmol/L): 5.4 mmol/L

## 2023-01-08 LAB — TSH: TSH: 0.9 m[IU]/L (ref 0.40–4.50)

## 2023-01-09 LAB — HEMOGLOBIN AND HEMATOCRIT, BLOOD
Hematocrit: 48.3 % (ref 37.5–51.0)
Hemoglobin: 15.8 g/dL (ref 13.0–17.7)

## 2023-01-09 LAB — TESTOSTERONE: Testosterone: 621 ng/dL (ref 264–916)

## 2023-01-22 ENCOUNTER — Other Ambulatory Visit: Payer: Self-pay | Admitting: Urology

## 2023-01-28 ENCOUNTER — Other Ambulatory Visit: Payer: Self-pay | Admitting: Urology

## 2023-02-03 ENCOUNTER — Other Ambulatory Visit: Payer: Self-pay | Admitting: Surgery

## 2023-02-03 DIAGNOSIS — M1711 Unilateral primary osteoarthritis, right knee: Secondary | ICD-10-CM

## 2023-02-21 ENCOUNTER — Ambulatory Visit
Admission: RE | Admit: 2023-02-21 | Discharge: 2023-02-21 | Disposition: A | Discharge status: Home / Self Care | Payer: 59 | Source: Ambulatory Visit | Attending: Surgery | Admitting: Surgery

## 2023-02-21 DIAGNOSIS — M1711 Unilateral primary osteoarthritis, right knee: Secondary | ICD-10-CM

## 2023-03-16 ENCOUNTER — Other Ambulatory Visit: Payer: Self-pay | Admitting: Urology

## 2023-03-16 DIAGNOSIS — E291 Testicular hypofunction: Secondary | ICD-10-CM

## 2023-03-17 ENCOUNTER — Telehealth: Payer: Self-pay | Admitting: Urology

## 2023-03-17 NOTE — Telephone Encounter (Signed)
I sent this MyChart message in November and he has not scheduled his three month follow up appointment.  I will need to have that scheduled before I can refill his testosterone.   Gary Schultz, Your testosterone levels within therapeutic parameters and your hemoglobin and hematocrit are normal.  Continue present dose of testosterone cypionate.  We will need to repeat your labs (testosterone, hemoglobin and hematocrit) in three months, 3 to 4 days after an injection.  Please call the office [phone]) or request an appointment through MyChart.   Beverley Sherrard, PA-C

## 2023-03-20 ENCOUNTER — Other Ambulatory Visit: Payer: Self-pay | Admitting: Family Medicine

## 2023-03-20 DIAGNOSIS — J0110 Acute frontal sinusitis, unspecified: Secondary | ICD-10-CM

## 2023-03-20 NOTE — Telephone Encounter (Signed)
Requested Prescriptions  Pending Prescriptions Disp Refills   fluticasone (FLONASE) 50 MCG/ACT nasal spray [Pharmacy Med Name: FLUTICASONE PROP 50 MCG SPRAY] 16 mL 2    Sig: PLACE 2 SPRAYS IN BOTH NOSTRILS DAILY FOR 4-6 WEEKS THEN STOP AND USE SEASONALLY OR AS NEEDED.     Ear, Nose, and Throat: Nasal Preparations - Corticosteroids Passed - 03/20/2023  2:01 PM      Passed - Valid encounter within last 12 months    Recent Outpatient Visits           2 months ago Annual physical exam   [place] [facility] [place], Netta Neat, DO   5 months ago Acute non-recurrent frontal sinusitis   [place] [facility] [place], Netta Neat, DO   8 months ago Chronic right-sided low back pain with right-sided sciatica   [place] [facility] [place], Salvadore [place], NP   10 months ago Neural foraminal stenosis of cervical spine   [place] [facility] [place] Smitty Cords, DO   11 months ago DDD (degenerative disc disease), lumbar   [place] [facility] Smitty Cords, DO       Future Appointments             In 4 days [place], Elana [facility] [place]   In 3 months Deirdre Evener, MD [facility]   In 9 months Althea Charon, Netta Neat, DO [place] [facility]

## 2023-03-22 NOTE — Progress Notes (Deleted)
 03/24/2023 8:14 PM   Gary Schultz [birth year] [id number]  Referring provider: Edman Marsa PARAS, DO [address] [place],  [place] [address]  Urological history: 1.  Hypogonadism -Contributing factors of age, chronic opioid use and high BMI -Testosterone  level pending -Hemoglobin/hematocrit pending -Testosterone  cypionate 200 mg/milliliters, 1 mL every 14 days  2. BPH with LU TS -PSA (11/2022) 0.8  3. Erectile dysfunction -Contributing factors of age, hypogonadism, chronic opioid use, high BMI, alcohol consumption, hypertension, lumbar DDD, anxiety and depression -Tadalafil  20 mg on-demand dosing  No chief complaint on file.  HPI: Gary Schultz is a 58 y.o. male who presents today for follow up.  Previous records reviewed.   I PSS ***   Score:  1-7 Mild 8-19 Moderate 20-35 Severe   SHIM ***     Score: 1-7 Severe ED 8-11 Moderate ED 12-16 Mild-Moderate ED 17-21 Mild ED 22-25 No ED   PMH: Past Medical History:  Diagnosis Date   Anxiety    Carpal tunnel syndrome    bilateral   DDD (degenerative disc disease), lumbar    Generalized headaches    from neck injury   GERD (gastroesophageal reflux disease)    Hypertension    Neck pain    Neuromuscular disorder (HCC)    Varicose veins     Surgical History: Past Surgical History:  Procedure Laterality Date   BACK SURGERY  2006   L4-L5 herniated disc repair   CARPAL TUNNEL RELEASE Right 12/22/2019   Procedure: CARPAL TUNNEL RELEASE ENDOSCOPIC;  Surgeon: Edie Norleen PARAS, MD;  Location: [facility];  Service: Orthopedics;  Laterality: Right;   CARPAL TUNNEL RELEASE Left 02/29/2020   Procedure: CARPAL TUNNEL RELEASE ENDOSCOPIC;  Surgeon: Edie Norleen PARAS, MD;  Location: [facility];  Service: Orthopedics;  Laterality: Left;   CLAVICLE SURGERY Right    COLONOSCOPY WITH PROPOFOL  N/A 06/08/2020   Procedure: COLONOSCOPY WITH PROPOFOL ;  Surgeon: Jinny Carmine, MD;  Location: [facility];  Service:  Endoscopy;  Laterality: N/A;   FRACTURE SURGERY     KNEE ARTHROSCOPY WITH MEDIAL MENISECTOMY Left 09/28/2018   Procedure: KNEE ARTHROSCOPY WITH DEBRIDEMENT, abrasion chondroplasty of patella, PARTIAL MEDIAL MENISCECTOMY;  Surgeon: Edie Norleen PARAS, MD;  Location: [facility];  Service: Orthopedics;  Laterality: Left;   KNEE ARTHROSCOPY WITH MEDIAL MENISECTOMY Right 12/07/2018   Procedure: RIGHT KNEE ARTHROSCOPY WITH DEBRIDEMENT, REPAIR, PARTIAL MEDIAL MENISECTOMY;  Surgeon: Edie Norleen PARAS, MD;  Location: [facility];  Service: Orthopedics;  Laterality: Right;   REPAIR OF PERONEUS BREVIS TENDON Left 03/29/2019   Procedure: Debridement of chronic peroneus brevis tendinopathy with reconstruction of calcaneofibular and anterior talofibular ligaments;  Surgeon: Edie Norleen PARAS, MD;  Location: [facility];  Service: Orthopedics;  Laterality: Left;   SHOULDER SURGERY Right 1992    Home Medications:  Allergies as of 03/24/2023       Reactions   Tylenol  [acetaminophen ] Hives, Itching, Rash, Other (See Comments)   Stomach pain        Medication List        Accurate as of March 22, 2023  8:14 PM. If you have any questions, ask your nurse or doctor.          amoxicillin -clavulanate 875-125 MG tablet Commonly known as: AUGMENTIN  Take 1 tablet by mouth 2 (two) times daily.   B-12 PO Take by mouth.   B-D 3CC LUER-LOK SYR 21GX1-1/2 21G X 1-1/2 3 ML Misc Generic drug: SYRINGE-NEEDLE (DISP) 3 ML USE THIS NEEDLE TO INJECTION  BD SafetyGlide Needle 18G X 1-1/2 Misc Generic drug: NEEDLE (DISP) 18 G This needle is to pull up medication   D3 PO Take by mouth.   diclofenac  Sodium 1 % Gel Commonly known as: VOLTAREN  SMARTSIG:2-4 Gram(s) Topical 2-4 Times Daily   fluticasone  50 MCG/ACT nasal spray Commonly known as: FLONASE  PLACE 2 SPRAYS IN BOTH NOSTRILS DAILY FOR 4-6 WEEKS THEN STOP AND USE SEASONALLY OR AS NEEDED.   gabapentin  300 MG capsule Commonly known as: NEURONTIN  Take 300-900 mg by mouth  3 (three) times daily as needed (pain).   lidocaine  5 % Commonly known as: LIDODERM  Place 1-2 patches onto the skin daily as needed (back pain).   lisinopril  30 MG tablet Commonly known as: ZESTRIL  Take 1 tablet (30 mg total) by mouth daily.   omeprazole  20 MG capsule Commonly known as: PRILOSEC Take 1 capsule (20 mg total) by mouth daily before breakfast. Extra refills on file   Oxycodone  HCl 20 MG Tabs Take 20 mg by mouth 5 (five) times daily as needed (pain).   predniSONE  10 MG tablet Commonly known as: DELTASONE  Take 6 tabs with breakfast Day 1, 5 tabs Day 2, 4 tabs Day 3, 3 tabs Day 4, 2 tabs Day 5, 1 tab Day 6.   tadalafil  20 MG tablet Commonly known as: CIALIS  TAKE ONE-HALF TO ONE TABLET BY MOUTH EVERY OTHER DAY AS NEEDED FOR ERECTILE DYSFUNCTION   testosterone  cypionate 200 MG/ML injection Commonly known as: DEPOTESTOSTERONE CYPIONATE INJECT 1 CC INTRAMUSCULARLY EVERY 7 DAYS   VITAMIN C PO Take by mouth.        Allergies:  Allergies  Allergen Reactions   Tylenol  [Acetaminophen ] Hives, Itching, Rash and Other (See Comments)    Stomach pain    Family History: Family History  Problem Relation Age of Onset   Cancer Mother    Varicose Veins Mother    Cancer Father    Stroke Father    Hypertension Father    Colon cancer Father     Social History:  reports that he has never smoked. He has never used smokeless tobacco. He reports current alcohol use of about 3.0 standard drinks of alcohol per week. He reports that he does not use drugs.  ROS: Pertinent ROS in HPI  Physical Exam: There were no vitals taken for this visit.  Constitutional:  Well nourished. Alert and oriented, No acute distress. HEENT: Gary Schultz, moist mucus membranes.  Trachea midline, no masses. Cardiovascular: No clubbing, cyanosis, or edema. Respiratory: Normal respiratory effort, no increased work of breathing. GI: Abdomen is soft, non tender, non distended, no abdominal masses. Liver  and spleen not palpable.  No hernias appreciated.  Stool sample for occult testing is not indicated.   GU: No CVA tenderness.  No bladder fullness or masses.  Patient with circumcised/uncircumcised phallus. ***Foreskin easily retracted***  Urethral meatus is patent.  No penile discharge. No penile lesions or rashes. Scrotum without lesions, cysts, rashes and/or edema.  Testicles are located scrotally bilaterally. No masses are appreciated in the testicles. Left and right epididymis are normal. Rectal: Patient with  normal sphincter tone. Anus and perineum without scarring or rashes. No rectal masses are appreciated. Prostate is approximately *** grams, *** nodules are appreciated. Seminal vesicles are normal. Skin: No rashes, bruises or suspicious lesions. Lymph: No cervical or inguinal adenopathy. Neurologic: Grossly intact, no focal deficits, moving all 4 extremities. Psychiatric: Normal mood and affect.   Laboratory Data: *** I have reviewed the labs.   Pertinent Imaging:  N/A  Assessment & Plan:    1. Hypogonadism -testosterone  levels pending -hemoglobin/hematocrit pending -Increase testosterone  cypionate to 1 cc every 7 days  2. BPH with LUTS -PSA stable --continue conservative management, avoiding bladder irritants and timed voiding's  3. Erectile dysfunction:    - *** No follow-ups on file.  These notes generated with voice recognition software. I apologize for typographical errors.  Gary Schultz  [facility] [address]  [address] [place], [place] [address] [phone]

## 2023-03-24 ENCOUNTER — Ambulatory Visit: Payer: Self-pay | Admitting: Urology

## 2023-03-24 DIAGNOSIS — E291 Testicular hypofunction: Secondary | ICD-10-CM

## 2023-03-24 DIAGNOSIS — N401 Enlarged prostate with lower urinary tract symptoms: Secondary | ICD-10-CM

## 2023-03-24 DIAGNOSIS — N529 Male erectile dysfunction, unspecified: Secondary | ICD-10-CM

## 2023-03-24 NOTE — Progress Notes (Signed)
 03/26/2023 10:08 AM   Gary Schultz, Gary Schultz [id number]  Referring provider: Edman Marsa PARAS, DO [address] [place],  [place] [address]  Urological history: 1.  Hypogonadism -Contributing factors of age, chronic opioid use and high BMI -Testosterone  level pending -Hemoglobin/hematocrit pending -Testosterone  cypionate 200 mg/milliliters, 1 mL every 14 days  2. BPH with LU TS -PSA (11/2022) 0.8  3. Erectile dysfunction -Contributing factors of age, hypogonadism, chronic opioid use, high BMI, alcohol consumption, hypertension, lumbar DDD, anxiety and depression -Tadalafil  20 mg on-demand dosing  Chief Complaint  Patient presents with   Follow-up    follow-up   HPI: Gary Schultz is a 58 y.o. male who presents today for follow up.  Previous records reviewed.   I PSS 4/1  No urinary complaints.  Patient denies any modifying or aggravating factors.  Patient denies any recent UTI's, gross hematuria, dysuria or suprapubic/flank pain.  Patient denies any fevers, chills, nausea or vomiting.     IPSS     Row Name 03/26/23 0900         International Prostate Symptom Score   How often have you had the sensation of not emptying your bladder? Not at All     How often have you had to urinate less than every two hours? Not at All     How often have you found you stopped and started again several times when you urinated? Less than 1 in 5 times     How often have you found it difficult to postpone urination? Less than 1 in 5 times     How often have you had a weak urinary stream? Less than 1 in 5 times     How often have you had to strain to start urination? Not at All     How many times did you typically get up at night to urinate? 1 Time     Total IPSS Score 4       Quality of Life due to urinary symptoms   If you were to spend the rest of your life with your urinary condition just the way it is now how would you feel about that? Pleased                Score:  1-7 Mild 8-19 Moderate 20-35 Severe   SHIM 25  He does not need to take the tadalafil  for ED.  Patient still having spontaneous erections.  He denies any pain or curvature with erections.     SHIM     Row Name 03/26/23 0945         SHIM: Over the last 6 months:   How do you rate your confidence that you could get and keep an erection? Very High     When you had erections with sexual stimulation, how often were your erections hard enough for penetration (entering your partner)? Almost Always or Always     During sexual intercourse, how often were you able to maintain your erection after you had penetrated (entered) your partner? Almost Always or Always     During sexual intercourse, how difficult was it to maintain your erection to completion of intercourse? Not Difficult     When you attempted sexual intercourse, how often was it satisfactory for you? Almost Always or Always       SHIM Total Score   SHIM 25               Score: 1-7 Severe  ED 8-11 Moderate ED 12-16 Mild-Moderate ED 17-21 Mild ED 22-25 No ED   PMH: Past Medical History:  Diagnosis Date   Anxiety    Carpal tunnel syndrome    bilateral   DDD (degenerative disc disease), lumbar    Generalized headaches    from neck injury   GERD (gastroesophageal reflux disease)    Hypertension    Neck pain    Neuromuscular disorder (HCC)    Varicose veins     Surgical History: Past Surgical History:  Procedure Laterality Date   BACK SURGERY  2006   L4-L5 herniated disc repair   CARPAL TUNNEL RELEASE Right 12/22/2019   Procedure: CARPAL TUNNEL RELEASE ENDOSCOPIC;  Surgeon: Edie Norleen PARAS, MD;  Location: [facility];  Service: Orthopedics;  Laterality: Right;   CARPAL TUNNEL RELEASE Left 02/29/2020   Procedure: CARPAL TUNNEL RELEASE ENDOSCOPIC;  Surgeon: Edie Norleen PARAS, MD;  Location: [facility];  Service: Orthopedics;  Laterality: Left;   CLAVICLE SURGERY Right    COLONOSCOPY WITH PROPOFOL  N/A  06/08/2020   Procedure: COLONOSCOPY WITH PROPOFOL ;  Surgeon: Jinny Carmine, MD;  Location[facility];  Service: Endoscopy;  Laterality: N/A;   FRACTURE SURGERY     KNEE ARTHROSCOPY WITH MEDIAL MENISECTOMY Left 09/28/2018   Procedure: KNEE ARTHROSCOPY WITH DEBRIDEMENT, abrasion chondroplasty of patella, PARTIAL MEDIAL MENISCECTOMY;  Surgeon: Edie Norleen PARAS, MD;  Location: [facility];  Service: Orthopedics;  Laterality: Left;   KNEE ARTHROSCOPY WITH MEDIAL MENISECTOMY Right 12/07/2018   Procedure: RIGHT KNEE ARTHROSCOPY WITH DEBRIDEMENT, REPAIR, PARTIAL MEDIAL MENISECTOMY;  Surgeon: Edie Norleen PARAS, MD;  Location: [facility];  Service: Orthopedics;  Laterality: Right;   REPAIR OF PERONEUS BREVIS TENDON Left 03/29/2019   Procedure: Debridement of chronic peroneus brevis tendinopathy with reconstruction of calcaneofibular and anterior talofibular ligaments;  Surgeon: Edie Norleen PARAS, MD;  Location: [facility];  Service: Orthopedics;  Laterality: Left;   SHOULDER SURGERY Right 1992    Home Medications:  Allergies as of 03/26/2023       Reactions   Tylenol  [acetaminophen ] Hives, Itching, Rash, Other (See Comments)   Stomach pain        Medication List        Accurate as of March 26, 2023 10:08 AM. If you have any questions, ask your nurse or doctor.          STOP taking these medications    amoxicillin -clavulanate 875-125 MG tablet Commonly known as: AUGMENTIN  Stopped by: Audrie Kuri   predniSONE  10 MG tablet Commonly known as: DELTASONE  Stopped by: Aleathea Pugmire       TAKE these medications    2-3CC SYRINGE 3 ML Misc 1 mg by Does not apply route every 14 (fourteen) days. Started by: Shep Porter   B-12 PO Take by mouth.   B-D 3CC LUER-LOK SYR 21GX1-1/2 21G X 1-1/2 3 ML Misc Generic drug: SYRINGE-NEEDLE (DISP) 3 ML USE THIS NEEDLE TO INJECTION   BD Disp Needles 21G X 1-1/2 Misc Generic drug: NEEDLE (DISP) 21 G 1 mg by Does not apply route every 14 (fourteen)  days. Started by: CLOTILDA CORNWALL   BD SafetyGlide Needle 18G X 1-1/2 Misc Generic drug: NEEDLE (DISP) 18 G This needle is to pull up medication What changed: Another medication with the same name was added. Make sure you understand how and when to take each. Changed by: CLOTILDA CORNWALL   BD Disp Needles 18G X 1-1/2 Misc Generic drug: NEEDLE (DISP) 18 G 1 mg by Does not apply route every  14 (fourteen) days. What changed: You were already taking a medication with the same name, and this prescription was added. Make sure you understand how and when to take each. Changed by: Loran Fleet   D3 PO Take by mouth.   diclofenac  Sodium 1 % Gel Commonly known as: VOLTAREN  SMARTSIG:2-4 Gram(s) Topical 2-4 Times Daily   fluticasone  50 MCG/ACT nasal spray Commonly known as: FLONASE  PLACE 2 SPRAYS IN BOTH NOSTRILS DAILY FOR 4-6 WEEKS THEN STOP AND USE SEASONALLY OR AS NEEDED.   gabapentin  300 MG capsule Commonly known as: NEURONTIN  Take 300-900 mg by mouth 3 (three) times daily as needed (pain).   lidocaine  5 % Commonly known as: LIDODERM  Place 1-2 patches onto the skin daily as needed (back pain).   lisinopril  30 MG tablet Commonly known as: ZESTRIL  Take 1 tablet (30 mg total) by mouth daily.   omeprazole  20 MG capsule Commonly known as: PRILOSEC Take 1 capsule (20 mg total) by mouth daily before breakfast. Extra refills on file   Oxycodone  HCl 20 MG Tabs Take 20 mg by mouth 5 (five) times daily as needed (pain).   tadalafil  20 MG tablet Commonly known as: CIALIS  TAKE ONE-HALF TO ONE TABLET BY MOUTH EVERY OTHER DAY AS NEEDED FOR ERECTILE DYSFUNCTION   testosterone  cypionate 200 MG/ML injection Commonly known as: DEPOTESTOSTERONE CYPIONATE INJECT 1 CC INTRAMUSCULARLY EVERY 7 DAYS   VITAMIN C PO Take by mouth.        Allergies:  Allergies  Allergen Reactions   Tylenol  [Acetaminophen ] Hives, Itching, Rash and Other (See Comments)    Stomach pain    Family  History: Family History  Problem Relation Age of Onset   Cancer Mother    Varicose Veins Mother    Cancer Father    Stroke Father    Hypertension Father    Colon cancer Father     Social History:  reports that he has never smoked. He has never been exposed to tobacco smoke. He has never used smokeless tobacco. He reports current alcohol use of about 3.0 standard drinks of alcohol per week. He reports that he does not use drugs.  ROS: Pertinent ROS in HPI  Physical Exam: BP 137/79   Pulse 78   Ht 6' 1 (1.854 m)   Wt 196 lb (88.9 kg)   BMI 25.86 kg/m   Constitutional:  Well nourished. Alert and oriented, No acute distress. HEENT: [place] AT, moist mucus membranes.  Trachea midline, no masses. Cardiovascular: No clubbing, cyanosis, or edema. Respiratory: Normal respiratory effort, no increased work of breathing. Neurologic: Grossly intact, no focal deficits, moving all 4 extremities. Psychiatric: Normal mood and affect.   Laboratory Data: pending I have reviewed the labs.   Pertinent Imaging: N/A  Assessment & Plan:    1. Hypogonadism -testosterone  levels pending -hemoglobin/hematocrit pending -continue testosterone  cypionate to 1 cc every 7 days  2. BPH with LUTS -PSA stable --continue conservative management, avoiding bladder irritants and timed voiding's  3. Erectile dysfunction:    -At goal with testosterone  therapy  Return in about 3 months (around 06/23/2023) for PSA, HCT, HBG, testosterone  - only .  These notes generated with voice recognition software. I apologize for typographical errors.  CLOTILDA HELON RIGGERS  [facility] [address]  [address] [place], [place] [address] [phone]

## 2023-03-24 NOTE — Telephone Encounter (Signed)
Pt canceled both appts through MyChart.  Just F.Y.I.

## 2023-03-26 ENCOUNTER — Encounter: Payer: Self-pay | Admitting: Urology

## 2023-03-26 ENCOUNTER — Ambulatory Visit: Payer: 59 | Admitting: Urology

## 2023-03-26 VITALS — BP 137/79 | HR 78 | Ht 73.0 in | Wt 196.0 lb

## 2023-03-26 DIAGNOSIS — E291 Testicular hypofunction: Secondary | ICD-10-CM | POA: Diagnosis not present

## 2023-03-26 DIAGNOSIS — N529 Male erectile dysfunction, unspecified: Secondary | ICD-10-CM | POA: Diagnosis not present

## 2023-03-26 DIAGNOSIS — N401 Enlarged prostate with lower urinary tract symptoms: Secondary | ICD-10-CM

## 2023-03-26 DIAGNOSIS — N138 Other obstructive and reflux uropathy: Secondary | ICD-10-CM

## 2023-03-26 MED ORDER — "BD DISP NEEDLES 21G X 1-1/2"" MISC": 1.0000 mg | 0 refills | Status: DC

## 2023-03-26 MED ORDER — SYRINGE 2-3 ML 3 ML MISC: 1.0000 mg | 3 refills | Status: DC

## 2023-03-26 MED ORDER — "BD DISP NEEDLES 18G X 1-1/2"" MISC": 1.0000 mg | 0 refills | Status: DC

## 2023-03-26 NOTE — Addendum Note (Signed)
 Addended by: Molli Angelucci on: 03/26/2023 10:13 AM   Modules accepted: Orders

## 2023-03-27 LAB — TESTOSTERONE: Testosterone: 739 ng/dL (ref 264–916)

## 2023-03-27 LAB — HEMOGLOBIN AND HEMATOCRIT, BLOOD
Hematocrit: 47.5 % (ref 37.5–51.0)
Hemoglobin: 15.9 g/dL (ref 13.0–17.7)

## 2023-04-07 ENCOUNTER — Other Ambulatory Visit: Payer: Self-pay | Admitting: Surgery

## 2023-04-14 ENCOUNTER — Other Ambulatory Visit: Payer: Self-pay

## 2023-04-14 ENCOUNTER — Encounter
Admission: RE | Admit: 2023-04-14 | Discharge: 2023-04-14 | Disposition: A | Discharge status: Home / Self Care | Payer: 59 | Source: Ambulatory Visit | Attending: Surgery | Admitting: Surgery

## 2023-04-14 VITALS — BP 158/100 | HR 86 | Temp 98.4°F | Resp 20 | Ht 72.0 in | Wt 201.3 lb

## 2023-04-14 DIAGNOSIS — Z01812 Encounter for preprocedural laboratory examination: Secondary | ICD-10-CM

## 2023-04-14 DIAGNOSIS — Z01818 Encounter for other preprocedural examination: Secondary | ICD-10-CM | POA: Insufficient documentation

## 2023-04-14 DIAGNOSIS — Z01810 Encounter for preprocedural cardiovascular examination: Secondary | ICD-10-CM | POA: Diagnosis not present

## 2023-04-14 LAB — URINALYSIS, ROUTINE W REFLEX MICROSCOPIC
Bilirubin Urine: NEGATIVE
Glucose, UA: NEGATIVE mg/dL
Hgb urine dipstick: NEGATIVE
Ketones, ur: NEGATIVE mg/dL
Leukocytes,Ua: NEGATIVE
Nitrite: NEGATIVE
Protein, ur: NEGATIVE mg/dL
Specific Gravity, Urine: 1.016 (ref 1.005–1.030)
pH: 6 (ref 5.0–8.0)

## 2023-04-14 LAB — COMPREHENSIVE METABOLIC PANEL
ALT: 17 U/L (ref 0–44)
AST: 13 U/L — ABNORMAL LOW (ref 15–41)
Albumin: 3.9 g/dL (ref 3.5–5.0)
Alkaline Phosphatase: 75 U/L (ref 38–126)
Anion gap: 9 (ref 5–15)
BUN: 14 mg/dL (ref 6–20)
CO2: 23 mmol/L (ref 22–32)
Calcium: 8.8 mg/dL — ABNORMAL LOW (ref 8.9–10.3)
Chloride: 103 mmol/L (ref 98–111)
Creatinine, Ser: 0.96 mg/dL (ref 0.61–1.24)
GFR, Estimated: >60 mL/min (ref >60)
Glucose, Bld: 109 mg/dL — ABNORMAL HIGH (ref 70–99)
Potassium: 4.2 mmol/L (ref 3.5–5.1)
Sodium: 135 mmol/L (ref 135–145)
Total Bilirubin: 0.8 mg/dL (ref 0.0–1.2)
Total Protein: 6.6 g/dL (ref 6.5–8.1)

## 2023-04-14 LAB — CBC WITH DIFFERENTIAL/PLATELET
Abs Immature Granulocytes: 0.03 10*3/uL (ref 0.00–0.07)
Basophils Absolute: 0 10*3/uL (ref 0.0–0.1)
Basophils Relative: 0 %
Eosinophils Absolute: 0 10*3/uL (ref 0.0–0.5)
Eosinophils Relative: 0 %
HCT: 44.4 % (ref 39.0–52.0)
Hemoglobin: 15.1 g/dL (ref 13.0–17.0)
Immature Granulocytes: 0 %
Lymphocytes Relative: 6 %
Lymphs Abs: 0.6 10*3/uL — ABNORMAL LOW (ref 0.7–4.0)
MCH: 29.5 pg (ref 26.0–34.0)
MCHC: 34 g/dL (ref 30.0–36.0)
MCV: 86.7 fL (ref 80.0–100.0)
Monocytes Absolute: 0.4 10*3/uL (ref 0.1–1.0)
Monocytes Relative: 4 %
Neutro Abs: 8.7 10*3/uL — ABNORMAL HIGH (ref 1.7–7.7)
Neutrophils Relative %: 90 %
Platelets: 200 10*3/uL (ref 150–400)
RBC: 5.12 MIL/uL (ref 4.22–5.81)
RDW: 12.8 % (ref 11.5–15.5)
WBC: 9.7 10*3/uL (ref 4.0–10.5)
nRBC: 0 % (ref 0.0–0.2)

## 2023-04-14 LAB — SURGICAL PCR SCREEN
MRSA, PCR: NEGATIVE
Staphylococcus aureus: POSITIVE — AB

## 2023-04-14 NOTE — Patient Instructions (Addendum)
 Your procedure is scheduled on: Wednesday 04/22/23  Report to the Registration Desk on the 1st floor of the [facility]. To find out your arrival time, please call [phone] between 1PM - 3PM [id number] 04/21/23 If your arrival time is 6:00 am, do not arrive before that time as the [facility] entrance doors do not open until 6:00 am.  REMEMBER: Instructions that are not followed completely may result in serious medical risk, up to and including death; or upon the discretion of your surgeon and anesthesiologist your surgery may need to be rescheduled.  Do not eat food after midnight the night before surgery.  No gum chewing or hard candies.  You may however, drink CLEAR liquids up to 2 hours before you are scheduled to arrive for your surgery. Do not drink anything within 2 hours of your scheduled arrival time.  Clear liquids include: - water  - apple juice without pulp - black coffee or tea (Do NOT add milk or creamers to the coffee or tea) Do NOT drink anything that is not on this list.   In addition, your doctor has ordered for you to drink the provided:  Ensure Pre-Surgery Clear Carbohydrate Drink  Drinking this carbohydrate drink up to two hours before surgery helps to reduce insulin resistance and improve patient outcomes. Please complete drinking 2 hours before scheduled arrival time.  One week prior to surgery: Stop Anti-inflammatories (NSAIDS) such as Advil, Aleve, Ibuprofen, Motrin, Naproxen, Naprosyn and Aspirin based products such as Excedrin, Goody's Powder, BC Powder. Stop ANY OVER THE COUNTER supplements until after surgery. Ascorbic Acid (VITAMIN C PO)  Cholecalciferol (D3 PO)  Cyanocobalamin (B-12 PO)  VITAMIN E PO    Do not use tadalafil (CIALIS) 20 MG 2 days prior to surgery (No more after Sunday 04/19/23)  Continue taking all of your other prescription medications up until the day of surgery.  ON THE DAY OF SURGERY ONLY TAKE THESE MEDICATIONS WITH SIPS OF  WATER:  gabapentin (NEURONTIN) 600 MG  omeprazole (PRILOSEC) 20 MG    No Alcohol for 24 hours before or after surgery.  No Smoking including e-cigarettes for 24 hours before surgery.  No chewable tobacco products for at least 6 hours before surgery.  No nicotine patches on the day of surgery.  Do not use any "recreational" drugs for at least a week (preferably 2 weeks) before your surgery.  Please be advised that the combination of cocaine and anesthesia may have negative outcomes, up to and including death. If you test positive for cocaine, your surgery will be cancelled.  On the morning of surgery brush your teeth with toothpaste and water, you may rinse your mouth with mouthwash if you wish. Do not swallow any toothpaste or mouthwash.  Use CHG Soap as directed on instruction sheet.  Do not wear jewelry, make-up, hairpins, clips or nail polish.  For welded (permanent) jewelry: bracelets, anklets, waist bands, etc.  Please have this removed prior to surgery.  If it is not removed, there is a chance that hospital personnel will need to cut it off on the day of surgery.  Do not wear lotions, powders, or perfumes.   Do not shave body hair from the neck down 48 hours before surgery.  Contact lenses, hearing aids and dentures may not be worn into surgery.  Do not bring valuables to the hospital. [facility] is not responsible for any missing/lost belongings or valuables.   Notify your doctor if there is any change in your medical  condition (cold, fever, infection).  Wear comfortable clothing (specific to your surgery type) to the hospital.  After surgery, you can help prevent lung complications by doing breathing exercises.  Take deep breaths and cough every 1-2 hours. Your doctor may order a device called an Incentive Spirometer to help you take deep breaths. When coughing or sneezing, hold a pillow firmly against your incision with both hands. This is called "splinting." Doing  this helps protect your incision. It also decreases belly discomfort.  If you are being admitted to the hospital overnight, leave your suitcase in the car. After surgery it may be brought to your room.  In case of increased patient census, it may be necessary for you, the patient, to continue your postoperative care in the Same Day Surgery department.  If you are being discharged the day of surgery, you will not be allowed to drive home.  You will need a responsible individual to drive you home and stay with you for 24 hours after surgery.   If you are taking public transportation, you will need to have a responsible individual with you.  Please call the Pre-admissions Testing Dept. at [phone] if you have any questions about these instructions.  Surgery Visitation Policy:  Patients having surgery or a procedure may have two visitors.  Children under the age of 68 must have an adult with them who is not the patient.  Temporary Visitor Restrictions Due to increasing cases of flu, RSV and COVID-19: Children ages 79 and under will not be able to visit patients in [facility] hospitals under most circumstances.  Inpatient Visitation:    Visiting hours are 7 a.m. to 8 p.m. Up to four visitors are allowed at one time in a patient room. The visitors may rotate out with other people during the day.  One visitor age 88 or older may stay with the patient overnight and must be in the room by 8 p.m.    Pre-operative 5 CHG Bath Instructions   You can play a key role in reducing the risk of infection after surgery. Your skin needs to be as free of germs as possible. You can reduce the number of germs on your skin by washing with CHG (chlorhexidine gluconate) soap before surgery. CHG is an antiseptic soap that kills germs and continues to kill germs even after washing.   DO NOT use if you have an allergy to chlorhexidine/CHG or antibacterial soaps. If your skin becomes reddened or  irritated, stop using the CHG and notify one of our RNs at [phone].   Please shower with the CHG soap starting 4 days before surgery using the following schedule:     Please keep in mind the following:  DO NOT shave, including legs and underarms, starting the day of your first shower.   You may shave your face at any point before/day of surgery.  Place clean sheets on your bed the day you start using CHG soap. Use a clean washcloth (not used since being washed) for each shower. DO NOT sleep with pets once you start using the CHG.   CHG Shower Instructions:  If you choose to wash your hair and private area, wash first with your normal shampoo/soap.  After you use shampoo/soap, rinse your hair and body thoroughly to remove shampoo/soap residue.  Turn the water OFF and apply about 3 tablespoons (45 ml) of CHG soap to a CLEAN washcloth.  Apply CHG soap ONLY FROM YOUR NECK DOWN TO YOUR TOES (washing  for 3-5 minutes)  DO NOT use CHG soap on face, private areas, open wounds, or sores.  Pay special attention to the area where your surgery is being performed.  If you are having back surgery, having someone wash your back for you may be helpful. Wait 2 minutes after CHG soap is applied, then you may rinse off the CHG soap.  Pat dry with a clean towel  Put on clean clothes/pajamas   If you choose to wear lotion, please use ONLY the CHG-compatible lotions on the back of this paper.     Additional instructions for the day of surgery: DO NOT APPLY any lotions, deodorants, cologne, or perfumes.   Put on clean/comfortable clothes.  Brush your teeth.  Ask your nurse before applying any prescription medications to the skin.     CHG Compatible Lotions   Aveeno Moisturizing lotion  Cetaphil Moisturizing Cream  Cetaphil Moisturizing Lotion  Clairol Herbal Essence Moisturizing Lotion, Dry Skin  Clairol Herbal Essence Moisturizing Lotion, Extra Dry Skin  Clairol Herbal Essence Moisturizing  Lotion, Normal Skin  Curel Age Defying Therapeutic Moisturizing Lotion with Alpha Hydroxy  Curel Extreme Care Body Lotion  Curel Soothing Hands Moisturizing Hand Lotion  Curel Therapeutic Moisturizing Cream, Fragrance-Free  Curel Therapeutic Moisturizing Lotion, Fragrance-Free  Curel Therapeutic Moisturizing Lotion, Original Formula  Eucerin Daily Replenishing Lotion  Eucerin Dry Skin Therapy Plus Alpha Hydroxy Crme  Eucerin Dry Skin Therapy Plus Alpha Hydroxy Lotion  Eucerin Original Crme  Eucerin Original Lotion  Eucerin Plus Crme Eucerin Plus Lotion  Eucerin TriLipid Replenishing Lotion  Keri Anti-Bacterial Hand Lotion  Keri Deep Conditioning Original Lotion Dry Skin Formula Softly Scented  Keri Deep Conditioning Original Lotion, Fragrance Free Sensitive Skin Formula  Keri Lotion Fast Absorbing Fragrance Free Sensitive Skin Formula  Keri Lotion Fast Absorbing Softly Scented Dry Skin Formula  Keri Original Lotion  Keri Skin Renewal Lotion Keri Silky Smooth Lotion  Keri Silky Smooth Sensitive Skin Lotion  Nivea Body Creamy Conditioning Oil  Nivea Body Extra Enriched Lotion  Nivea Body Original Lotion  Nivea Body Sheer Moisturizing Lotion Nivea Crme  Nivea Skin Firming Lotion  NutraDerm 30 Skin Lotion  NutraDerm Skin Lotion  NutraDerm Therapeutic Skin Cream  NutraDerm Therapeutic Skin Lotion  ProShield Protective Hand Cream  Provon moisturizing lotion  How to Use an Incentive Spirometer  An incentive spirometer is a tool that measures how well you are filling your lungs with each breath. Learning to take long, deep breaths using this tool can help you keep your lungs clear and active. This may help to reverse or lessen your chance of developing breathing (pulmonary) problems, especially infection. You may be asked to use a spirometer: After a surgery. If you have a lung problem or a history of smoking. After a long period of time when you have been unable to move or be  active. If the spirometer includes an indicator to show the highest number that you have reached, your health care provider or respiratory therapist will help you set a goal. Keep a log of your progress as told by your health care provider. What are the risks? Breathing too quickly may cause dizziness or cause you to pass out. Take your time so you do not get dizzy or light-headed. If you are in pain, you may need to take pain medicine before doing incentive spirometry. It is harder to take a deep breath if you are having pain. How to use your incentive spirometer  Sit up on the  edge of your bed or on a chair. Hold the incentive spirometer so that it is in an upright position. Before you use the spirometer, breathe out normally. Place the mouthpiece in your mouth. Make sure your lips are closed tightly around it. Breathe in slowly and as deeply as you can through your mouth, causing the piston or the ball to rise toward the top of the chamber. Hold your breath for 3-5 seconds, or for as long as possible. If the spirometer includes a coach indicator, use this to guide you in breathing. Slow down your breathing if the indicator goes above the marked areas. Remove the mouthpiece from your mouth and breathe out normally. The piston or ball will return to the bottom of the chamber. Rest for a few seconds, then repeat the steps 10 or more times. Take your time and take a few normal breaths between deep breaths so that you do not get dizzy or light-headed. Do this every 1-2 hours when you are awake. If the spirometer includes a goal marker to show the highest number you have reached (best effort), use this as a goal to work toward during each repetition. After each set of 10 deep breaths, cough a few times. This will help to make sure that your lungs are clear. If you have an incision on your chest or abdomen from surgery, place a pillow or a rolled-up towel firmly against the incision when you cough.  This can help to reduce pain while taking deep breaths and coughing. General tips When you are able to get out of bed: Walk around often. Continue to take deep breaths and cough in order to clear your lungs. Keep using the incentive spirometer until your health care provider says it is okay to stop using it. If you have been in the hospital, you may be told to keep using the spirometer at home. Contact a health care provider if: You are having difficulty using the spirometer. You have trouble using the spirometer as often as instructed. Your pain medicine is not giving enough relief for you to use the spirometer as told. You have a fever. Get help right away if: You develop shortness of breath. You develop a cough with bloody mucus from the lungs. You have fluid or blood coming from an incision site after you cough. Summary An incentive spirometer is a tool that can help you learn to take long, deep breaths to keep your lungs clear and active. You may be asked to use a spirometer after a surgery, if you have a lung problem or a history of smoking, or if you have been inactive for a long period of time. Use your incentive spirometer as instructed every 1-2 hours while you are awake. If you have an incision on your chest or abdomen, place a pillow or a rolled-up towel firmly against your incision when you cough. This will help to reduce pain. Get help right away if you have shortness of breath, you cough up bloody mucus, or blood comes from your incision when you cough. This information is not intended to replace advice given to you by your health care provider. Make sure you discuss any questions you have with your health care provider. Document Revised: 04/25/2019 Document Reviewed: 04/25/2019 Elsevier Patient Education  2023 [employer].   Please go to the following website to access important education materials concerning your upcoming joint replacement.  [email or web address]

## 2023-04-22 ENCOUNTER — Other Ambulatory Visit: Payer: Self-pay

## 2023-04-22 ENCOUNTER — Encounter: Payer: Self-pay | Admitting: Surgery

## 2023-04-22 ENCOUNTER — Encounter
Admission: RE | Disposition: A | Discharge status: Home / Self Care | Payer: Self-pay | Source: Home / Self Care | Attending: Surgery

## 2023-04-22 ENCOUNTER — Ambulatory Visit: Payer: Self-pay | Admitting: Urgent Care

## 2023-04-22 ENCOUNTER — Ambulatory Visit
Admission: RE | Admit: 2023-04-22 | Discharge: 2023-04-22 | Disposition: A | Discharge status: Home / Self Care | Payer: 59 | Attending: Surgery | Admitting: Surgery

## 2023-04-22 ENCOUNTER — Ambulatory Visit

## 2023-04-22 ENCOUNTER — Ambulatory Visit: Admitting: Anesthesiology

## 2023-04-22 DIAGNOSIS — Z79899 Other long term (current) drug therapy: Secondary | ICD-10-CM | POA: Diagnosis not present

## 2023-04-22 DIAGNOSIS — K219 Gastro-esophageal reflux disease without esophagitis: Secondary | ICD-10-CM | POA: Insufficient documentation

## 2023-04-22 DIAGNOSIS — M1711 Unilateral primary osteoarthritis, right knee: Secondary | ICD-10-CM | POA: Insufficient documentation

## 2023-04-22 DIAGNOSIS — I10 Essential (primary) hypertension: Secondary | ICD-10-CM | POA: Insufficient documentation

## 2023-04-22 DIAGNOSIS — J0110 Acute frontal sinusitis, unspecified: Secondary | ICD-10-CM

## 2023-04-22 DIAGNOSIS — E291 Testicular hypofunction: Secondary | ICD-10-CM

## 2023-04-22 HISTORY — PX: TOTAL KNEE ARTHROPLASTY: SHX125

## 2023-04-22 SURGERY — ARTHROPLASTY, KNEE, TOTAL
Anesthesia: Spinal | Site: Knee | Laterality: Right

## 2023-04-22 MED ORDER — LACTATED RINGERS IV SOLN: INTRAVENOUS | Status: DC | PRN

## 2023-04-22 MED ORDER — PHENYLEPHRINE 80 MCG/ML (10ML) SYRINGE FOR IV PUSH (FOR BLOOD PRESSURE SUPPORT)
PREFILLED_SYRINGE | INTRAVENOUS | Status: DC | PRN
  Administered 2023-04-22: 80 ug via INTRAVENOUS

## 2023-04-22 MED ORDER — FENTANYL CITRATE (PF) 100 MCG/2ML IJ SOLN
INTRAMUSCULAR | Status: AC
  Filled 2023-04-22: qty 2

## 2023-04-22 MED ORDER — DEXAMETHASONE SODIUM PHOSPHATE 10 MG/ML IJ SOLN
INTRAMUSCULAR | Status: DC | PRN
  Administered 2023-04-22 (×2): 5 mg via INTRAVENOUS

## 2023-04-22 MED ORDER — CEFAZOLIN SODIUM-DEXTROSE 2-4 GM/100ML-% IV SOLN
2.0000 g | INTRAVENOUS | Status: AC
  Administered 2023-04-22: 2 g via INTRAVENOUS

## 2023-04-22 MED ORDER — METOCLOPRAMIDE HCL 5 MG/ML IJ SOLN: 5.0000 mg | Freq: Three times a day (TID) | INTRAMUSCULAR | Status: DC | PRN

## 2023-04-22 MED ORDER — KETAMINE HCL 50 MG/5ML IJ SOSY
PREFILLED_SYRINGE | INTRAMUSCULAR | Status: AC
  Filled 2023-04-22: qty 5

## 2023-04-22 MED ORDER — DEXTROSE-SODIUM CHLORIDE 5-0.9 % IV SOLN: INTRAVENOUS | Status: DC

## 2023-04-22 MED ORDER — ONDANSETRON HCL 4 MG/2ML IJ SOLN: 4.0000 mg | Freq: Four times a day (QID) | INTRAMUSCULAR | Status: DC | PRN

## 2023-04-22 MED ORDER — FLUTICASONE PROPIONATE 50 MCG/ACT NA SUSP
1.0000 | Freq: Every day | NASAL | Status: DC | PRN
Start: 2023-04-22 — End: 2023-06-12

## 2023-04-22 MED ORDER — OXYCODONE HCL 10 MG PO TABS: 10.0000 mg | ORAL_TABLET | Freq: Four times a day (QID) | ORAL | 0 refills | Status: DC | PRN

## 2023-04-22 MED ORDER — KETOROLAC TROMETHAMINE 30 MG/ML IJ SOLN
INTRAMUSCULAR | Status: DC | PRN
  Administered 2023-04-22: 30 mg via INTRAMUSCULAR

## 2023-04-22 MED ORDER — KETAMINE HCL 50 MG/5ML IJ SOSY
PREFILLED_SYRINGE | INTRAMUSCULAR | Status: DC | PRN
Start: 2023-04-22 — End: 2023-04-22
  Administered 2023-04-22: 10 mg via INTRAVENOUS
  Administered 2023-04-22: 20 mg via INTRAVENOUS
  Administered 2023-04-22 (×2): 10 mg via INTRAVENOUS

## 2023-04-22 MED ORDER — KETOROLAC TROMETHAMINE 30 MG/ML IJ SOLN
INTRAMUSCULAR | Status: AC
  Filled 2023-04-22: qty 1

## 2023-04-22 MED ORDER — METOCLOPRAMIDE HCL 10 MG PO TABS: 5.0000 mg | ORAL_TABLET | Freq: Three times a day (TID) | ORAL | Status: DC | PRN

## 2023-04-22 MED ORDER — ONDANSETRON HCL 4 MG/2ML IJ SOLN
INTRAMUSCULAR | Status: DC | PRN
  Administered 2023-04-22: 4 mg via INTRAVENOUS

## 2023-04-22 MED ORDER — MUPIROCIN 2 % EX OINT: 1.0000 | TOPICAL_OINTMENT | Freq: Two times a day (BID) | CUTANEOUS | 0 refills | Status: AC

## 2023-04-22 MED ORDER — TRIAMCINOLONE ACETONIDE 40 MG/ML IJ SUSP
INTRAMUSCULAR | Status: AC
  Filled 2023-04-22: qty 1

## 2023-04-22 MED ORDER — SODIUM CHLORIDE 0.9 % BOLUS PEDS
250.0000 mL | Freq: Once | INTRAVENOUS | Status: AC
  Administered 2023-04-22: 250 mL via INTRAVENOUS

## 2023-04-22 MED ORDER — SODIUM CHLORIDE 0.9 % IR SOLN
Status: DC | PRN
  Administered 2023-04-22: 3000 mL

## 2023-04-22 MED ORDER — PROPOFOL 10 MG/ML IV BOLUS
INTRAVENOUS | Status: AC
  Filled 2023-04-22: qty 20

## 2023-04-22 MED ORDER — PROPOFOL 500 MG/50ML IV EMUL
INTRAVENOUS | Status: DC | PRN
  Administered 2023-04-22: 75 ug/kg/min via INTRAVENOUS
  Administered 2023-04-22: 20 mg via INTRAVENOUS

## 2023-04-22 MED ORDER — OXYCODONE HCL 5 MG PO TABS: 10.0000 mg | ORAL_TABLET | ORAL | Status: DC | PRN

## 2023-04-22 MED ORDER — TRIAMCINOLONE ACETONIDE 40 MG/ML IJ SUSP
INTRAMUSCULAR | Status: DC | PRN
  Administered 2023-04-22: 80 mg via INTRAMUSCULAR

## 2023-04-22 MED ORDER — LIDOCAINE HCL (CARDIAC) PF 100 MG/5ML IV SOSY
PREFILLED_SYRINGE | INTRAVENOUS | Status: DC | PRN
  Administered 2023-04-22: 70 mg via INTRAVENOUS

## 2023-04-22 MED ORDER — PROPOFOL 10 MG/ML IV BOLUS
INTRAVENOUS | Status: AC
  Filled 2023-04-22: qty 40

## 2023-04-22 MED ORDER — MIDAZOLAM HCL 2 MG/2ML IJ SOLN
INTRAMUSCULAR | Status: AC
  Filled 2023-04-22: qty 2

## 2023-04-22 MED ORDER — 0.9 % SODIUM CHLORIDE (POUR BTL) OPTIME
TOPICAL | Status: DC | PRN
  Administered 2023-04-22: 1000 mL

## 2023-04-22 MED ORDER — CEFAZOLIN SODIUM-DEXTROSE 2-4 GM/100ML-% IV SOLN
INTRAVENOUS | Status: AC
  Filled 2023-04-22: qty 100

## 2023-04-22 MED ORDER — OXYCODONE HCL 5 MG PO TABS
ORAL_TABLET | ORAL | Status: AC
  Filled 2023-04-22: qty 4

## 2023-04-22 MED ORDER — TRANEXAMIC ACID-NACL 1000-0.7 MG/100ML-% IV SOLN
INTRAVENOUS | Status: AC
  Filled 2023-04-22: qty 100

## 2023-04-22 MED ORDER — TRANEXAMIC ACID-NACL 1000-0.7 MG/100ML-% IV SOLN
1000.0000 mg | INTRAVENOUS | Status: AC
  Administered 2023-04-22 (×2): 1000 mg via INTRAVENOUS

## 2023-04-22 MED ORDER — BUPIVACAINE-EPINEPHRINE (PF) 0.5% -1:200000 IJ SOLN
INTRAMUSCULAR | Status: DC | PRN
  Administered 2023-04-22: 30 mL

## 2023-04-22 MED ORDER — SODIUM CHLORIDE (PF) 0.9 % IJ SOLN
INTRAMUSCULAR | Status: AC
  Filled 2023-04-22: qty 50

## 2023-04-22 MED ORDER — CHLORHEXIDINE GLUCONATE 0.12 % MT SOLN
OROMUCOSAL | Status: AC
  Filled 2023-04-22: qty 15

## 2023-04-22 MED ORDER — BUPIVACAINE LIPOSOME 1.3 % IJ SUSP
INTRAMUSCULAR | Status: AC
  Filled 2023-04-22: qty 20

## 2023-04-22 MED ORDER — LIDOCAINE HCL (PF) 2 % IJ SOLN
INTRAMUSCULAR | Status: AC
  Filled 2023-04-22: qty 5

## 2023-04-22 MED ORDER — ONDANSETRON HCL 4 MG PO TABS: 4.0000 mg | ORAL_TABLET | Freq: Four times a day (QID) | ORAL | Status: DC | PRN

## 2023-04-22 MED ORDER — TESTOSTERONE CYPIONATE 200 MG/ML IM SOLN: 200.0000 mg | INTRAMUSCULAR | Status: DC

## 2023-04-22 MED ORDER — BUPIVACAINE-EPINEPHRINE (PF) 0.5% -1:200000 IJ SOLN
INTRAMUSCULAR | Status: AC
  Filled 2023-04-22: qty 30

## 2023-04-22 MED ORDER — ORAL CARE MOUTH RINSE: 15.0000 mL | Freq: Once | OROMUCOSAL | Status: AC

## 2023-04-22 MED ORDER — KETOROLAC TROMETHAMINE 30 MG/ML IJ SOLN
30.0000 mg | Freq: Once | INTRAMUSCULAR | Status: AC
  Administered 2023-04-22: 30 mg via INTRAVENOUS

## 2023-04-22 MED ORDER — MIDAZOLAM HCL 5 MG/5ML IJ SOLN
INTRAMUSCULAR | Status: DC | PRN
  Administered 2023-04-22 (×2): 2 mg via INTRAVENOUS

## 2023-04-22 MED ORDER — APIXABAN 2.5 MG PO TABS: 2.5000 mg | ORAL_TABLET | Freq: Two times a day (BID) | ORAL | 0 refills | Status: DC

## 2023-04-22 MED ORDER — OXYCODONE HCL 5 MG PO TABS
20.0000 mg | ORAL_TABLET | Freq: Once | ORAL | Status: AC
  Administered 2023-04-22: 20 mg via ORAL

## 2023-04-22 MED ORDER — DEXAMETHASONE SODIUM PHOSPHATE 10 MG/ML IJ SOLN
INTRAMUSCULAR | Status: AC
  Filled 2023-04-22: qty 1

## 2023-04-22 MED ORDER — FENTANYL CITRATE (PF) 100 MCG/2ML IJ SOLN
INTRAMUSCULAR | Status: DC | PRN
  Administered 2023-04-22 (×2): 50 ug via INTRAVENOUS

## 2023-04-22 MED ORDER — CHLORHEXIDINE GLUCONATE 0.12 % MT SOLN
15.0000 mL | Freq: Once | OROMUCOSAL | Status: AC
  Administered 2023-04-22: 15 mL via OROMUCOSAL

## 2023-04-22 MED ORDER — BUPIVACAINE HCL (PF) 0.5 % IJ SOLN
INTRAMUSCULAR | Status: DC | PRN
  Administered 2023-04-22: 3 mL

## 2023-04-22 MED ORDER — PHENYLEPHRINE 80 MCG/ML (10ML) SYRINGE FOR IV PUSH (FOR BLOOD PRESSURE SUPPORT)
PREFILLED_SYRINGE | INTRAVENOUS | Status: AC
  Filled 2023-04-22: qty 10

## 2023-04-22 MED ORDER — CHLORHEXIDINE GLUCONATE 4 % EX SOLN: 1.0000 | CUTANEOUS | 1 refills | Status: DC

## 2023-04-22 MED ORDER — DEXMEDETOMIDINE HCL IN NACL 80 MCG/20ML IV SOLN
INTRAVENOUS | Status: DC | PRN
  Administered 2023-04-22 (×2): 4 ug via INTRAVENOUS
  Administered 2023-04-22: 8 ug via INTRAVENOUS
  Administered 2023-04-22: 4 ug via INTRAVENOUS

## 2023-04-22 MED ORDER — ARTIFICIAL TEARS OPHTHALMIC OINT
TOPICAL_OINTMENT | OPHTHALMIC | Status: AC
Start: 2023-04-22 — End: ?
  Filled 2023-04-22: qty 3.5

## 2023-04-22 MED ORDER — ONDANSETRON HCL 4 MG/2ML IJ SOLN
INTRAMUSCULAR | Status: AC
  Filled 2023-04-22: qty 2

## 2023-04-22 MED ORDER — CEFAZOLIN SODIUM-DEXTROSE 2-4 GM/100ML-% IV SOLN
2.0000 g | Freq: Four times a day (QID) | INTRAVENOUS | Status: DC
  Administered 2023-04-22: 2 g via INTRAVENOUS

## 2023-04-22 MED ORDER — SODIUM CHLORIDE 0.9 % IV SOLN
INTRAVENOUS | Status: DC | PRN
  Administered 2023-04-22: 60 mL

## 2023-04-22 SURGICAL SUPPLY — 59 items
BLADE SAW 90X13X1.19 OSCILLAT (BLADE) ×1 IMPLANT
BLADE SAW SAG 25X90X1.19 (BLADE) ×1 IMPLANT
BLADE SURG SZ20 CARB STEEL (BLADE) ×1 IMPLANT
BNDG ELASTIC 6X5.8 VLCR NS LF (GAUZE/BANDAGES/DRESSINGS) ×1 IMPLANT
CEMENT VACUUM MIXING SYSTEM (MISCELLANEOUS) ×1 IMPLANT
CHLORAPREP W/TINT 26 (MISCELLANEOUS) ×1 IMPLANT
COMP FEM STD PS 8 RT (Joint) ×1 IMPLANT
COMP PATELLA 3 PEG 38 (Joint) ×1 IMPLANT
COMP TIB KNEE PS G 0 RT (Joint) ×1 IMPLANT
COMPONENT FEM STD PS 8 RT (Joint) IMPLANT
COMPONENT PATELLA 3 PEG 38 (Joint) IMPLANT
COMPONENT TIB KNEE PS G 0 RT (Joint) IMPLANT
COOLER POLAR GLACIER W/PUMP (MISCELLANEOUS) ×1 IMPLANT
COVER MAYO STAND STRL (DRAPES) ×1 IMPLANT
CUFF TRNQT CYL 24X4X16.5-23 (TOURNIQUET CUFF) IMPLANT
CUFF TRNQT CYL 34X4.125X (TOURNIQUET CUFF) IMPLANT
DRAPE IMP U-DRAPE 54X76 (DRAPES) ×1 IMPLANT
DRAPE SHEET LG 3/4 BI-LAMINATE (DRAPES) ×1 IMPLANT
DRAPE U-SHAPE 47X51 STRL (DRAPES) ×1 IMPLANT
DRSG MEPILEX SACRM 8.7X9.8 (GAUZE/BANDAGES/DRESSINGS) IMPLANT
DRSG OPSITE POSTOP 4X10 (GAUZE/BANDAGES/DRESSINGS) ×1 IMPLANT
DRSG OPSITE POSTOP 4X8 (GAUZE/BANDAGES/DRESSINGS) ×1 IMPLANT
ELECT CAUTERY BLADE 6.4 (BLADE) ×1 IMPLANT
ELECT REM PT RETURN 9FT ADLT (ELECTROSURGICAL) ×1 IMPLANT
ELECTRODE REM PT RTRN 9FT ADLT (ELECTROSURGICAL) ×1 IMPLANT
GAUZE XEROFORM 1X8 LF (GAUZE/BANDAGES/DRESSINGS) ×1 IMPLANT
GLOVE BIO SURGEON STRL SZ7.5 (GLOVE) ×4 IMPLANT
GLOVE BIO SURGEON STRL SZ8 (GLOVE) ×4 IMPLANT
GLOVE BIOGEL PI IND STRL 8 (GLOVE) ×1 IMPLANT
GLOVE INDICATOR 8.0 STRL GRN (GLOVE) ×1 IMPLANT
GLOVE SURG SYN 6.5 ES PF (GLOVE) ×2 IMPLANT
GLOVE SURG SYN 6.5 PF PI (GLOVE) IMPLANT
GOWN STRL REUS W/ TWL LRG LVL3 (GOWN DISPOSABLE) ×1 IMPLANT
GOWN STRL REUS W/ TWL XL LVL3 (GOWN DISPOSABLE) ×1 IMPLANT
HOOD PEEL AWAY T7 (MISCELLANEOUS) ×3 IMPLANT
INSERT TIB ASF PS 8-11 GH RT (Insert) IMPLANT
IV NS IRRIG 3000ML ARTHROMATIC (IV SOLUTION) ×1 IMPLANT
KIT TURNOVER KIT A (KITS) ×1 IMPLANT
MANIFOLD NEPTUNE II (INSTRUMENTS) ×1 IMPLANT
NDL SPNL 20GX3.5 QUINCKE YW (NEEDLE) ×1 IMPLANT
NEEDLE SPNL 20GX3.5 QUINCKE YW (NEEDLE) ×1 IMPLANT
NS IRRIG 500ML POUR BTL (IV SOLUTION) ×1 IMPLANT
PACK TOTAL KNEE (MISCELLANEOUS) ×1 IMPLANT
PAD WRAPON POLAR KNEE (MISCELLANEOUS) ×1 IMPLANT
PENCIL SMOKE EVACUATOR (MISCELLANEOUS) ×1 IMPLANT
PIN DRILL HDLS TROCAR 75 4PK (PIN) IMPLANT
PULSAVAC PLUS IRRIG FAN TIP (DISPOSABLE) ×1 IMPLANT
SCREW FEMALE HEX FIX 25X2.5 (ORTHOPEDIC DISPOSABLE SUPPLIES) IMPLANT
STAPLER SKIN PROX 35W (STAPLE) ×1 IMPLANT
SUCTION TUBE FRAZIER 10FR DISP (SUCTIONS) ×1 IMPLANT
SUT VIC AB 0 CT1 36 (SUTURE) ×3 IMPLANT
SUT VIC AB 2-0 CT1 TAPERPNT 27 (SUTURE) ×3 IMPLANT
SYR 10ML LL (SYRINGE) ×1 IMPLANT
SYR 20ML LL LF (SYRINGE) ×1 IMPLANT
SYR 30ML LL (SYRINGE) IMPLANT
TIP FAN IRRIG PULSAVAC PLUS (DISPOSABLE) ×1 IMPLANT
TRAP FLUID SMOKE EVACUATOR (MISCELLANEOUS) ×1 IMPLANT
WATER STERILE IRR 500ML POUR (IV SOLUTION) ×1 IMPLANT
WRAPON POLAR PAD KNEE (MISCELLANEOUS) ×1 IMPLANT

## 2023-04-22 NOTE — Evaluation (Signed)
 Physical Therapy Evaluation Patient Details Name: Gary Schultz MRN: [id number] DOB: 07/09/1965 Today's Date: 04/22/2023  History of Present Illness  Gary Schultz comes to [facility] for elective Rt TKA with Dr. Joice Lofts. PMH: bilat knee DJD, current sciatic pain and Rt radicular numbness.  Clinical Impression  Pt performs all mobility at supervision level including overground AMB with RW, voiding while standing. Pt/souse educated on DME, polar care, stairs performance. HEP handout provided. Rt knee ROM toelrated at ~15-80 degrees flexion. Pt anxious to leave today, generally pleased with state of his new knee. RN in bay at end of session, all needs met.         If plan is discharge home, recommend the following: A little help with walking and/or transfers;Assist for transportation;Assistance with cooking/housework;Help with stairs or ramp for entrance   Can travel by private vehicle        Equipment Recommendations Rolling walker (2 wheels);BSC/3in1  Recommendations for Other Services       Functional Status Assessment Patient has had a recent decline in their functional status and demonstrates the ability to make significant improvements in function in a reasonable and predictable amount of time.     Precautions / Restrictions Precautions Precautions: Fall Recall of Precautions/Restrictions: Intact Restrictions Weight Bearing Restrictions Per Provider Order: Yes RLE Weight Bearing Per Provider Order: Weight bearing as tolerated      Mobility  Bed Mobility Overal bed mobility: Modified Independent                  Transfers Overall transfer level: Modified independent Equipment used: Rolling walker (2 wheels)                    Ambulation/Gait Ambulation/Gait assistance: Supervision Gait Distance (Feet): 120 Feet Assistive device: Rolling walker (2 wheels) Gait Pattern/deviations: Step-to pattern, Step-through pattern Gait velocity: 0.24m/s         Stairs Stairs: Yes Stairs assistance: Contact guard assist, Supervision Stair Management: Two rails, Alternating pattern Number of Stairs: 8    Wheelchair Mobility     Tilt Bed    Modified Rankin (Stroke Patients Only)       Balance                                             Pertinent Vitals/Pain Pain Assessment Pain Assessment: 0-10 Pain Score: 7  Pain Location: Rt knee Pain Descriptors / Indicators: Aching Pain Intervention(s): Limited activity within patient's tolerance, Monitored during session, Premedicated before session    Home Living Family/patient expects to be discharged to:: Private residence Living Arrangements: Spouse/significant other Available Help at Discharge: Family Type of Home: House Home Access: Stairs to enter   Secretary/administrator of Steps: 4 Alternate Level Stairs-Number of Steps: 4 Home Layout: Multi-level;Able to live on main level with bedroom/bathroom Home Equipment: None      Prior Function Prior Level of Function : Independent/Modified Independent;Driving;Working/employed                     Extremity/Trunk Assessment                Communication        Cognition Arousal: Alert Behavior During Therapy: WFL for tasks assessed/performed   PT - Cognitive impairments: No apparent impairments  Cueing       General Comments      Exercises Total Joint Exercises Ankle Circles/Pumps: AROM, Both, 20 reps, Supine Straight Leg Raises: AROM, Right, Supine Long Arc Quad: AROM, Right, Seated Knee Flexion: AROM, Right, Seated   Assessment/Plan    PT Assessment Patient needs continued PT services  PT Problem List Decreased strength;Decreased range of motion;Decreased activity tolerance;Decreased balance;Decreased mobility;Decreased knowledge of use of DME;Decreased safety awareness;Decreased knowledge of precautions       PT Treatment  Interventions DME instruction;Patient/family education;Gait training;Stair training;Functional mobility training;Therapeutic activities;Therapeutic exercise;Balance training    PT Goals (Current goals can be found in the Care Plan section)  Acute Rehab PT Goals Patient Stated Goal: leave PT Goal Formulation: With patient Time For Goal Achievement: 05/06/23 Potential to Achieve Goals: Fair    Frequency BID     Co-evaluation               AM-PAC PT "6 Clicks" Mobility  Outcome Measure Help needed turning from your back to your side while in a flat bed without using bedrails?: None Help needed moving from lying on your back to sitting on the side of a flat bed without using bedrails?: None Help needed moving to and from a bed to a chair (including a wheelchair)?: None Help needed standing up from a chair using your arms (e.g., wheelchair or bedside chair)?: None Help needed to walk in hospital room?: A Little Help needed climbing 3-5 steps with a railing? : A Little 6 Click Score: 22    End of Session Equipment Utilized During Treatment: Gait belt Activity Tolerance: Patient tolerated treatment well;No increased pain Patient left: in chair;with call bell/phone within reach;with family/visitor present;with nursing/sitter in room Nurse Communication: Mobility status PT Visit Diagnosis: Unsteadiness on feet (R26.81);Difficulty in walking, not elsewhere classified (R26.2);Muscle weakness (generalized) (M62.81)    Time: 1610-9604 PT Time Calculation (min) (ACUTE ONLY): 38 min   Charges:   PT Evaluation $PT Eval Moderate Complexity: 1 Mod PT Treatments $Gait Training: 8-22 mins $Therapeutic Exercise: 8-22 mins PT General Charges $$ ACUTE PT VISIT: 1 Visit        3:18 PM, 04/22/23 Rosamaria Lints, PT, DPT Physical Therapist - [facility]  [phone] (ASCOM)    Doriann Zuch C 04/22/2023, 3:13 PM

## 2023-04-22 NOTE — Transfer of Care (Signed)
 Immediate Anesthesia Transfer of Care Note  Patient: Gary Schultz  Procedure(s) Performed: TOTAL KNEE ARTHROPLASTY - RNFA (Right: Knee)  Patient Location: PACU  Anesthesia Type:Spinal  Level of Consciousness: drowsy  Airway & Oxygen Therapy: Patient Spontanous Breathing  Post-op Assessment: Report given to RN and Post -op Vital signs reviewed and stable  Post vital signs: Reviewed and stable  Last Vitals:  Vitals Value Taken Time  BP 89/77 04/22/23 1117  Temp    Pulse 80 04/22/23 1117  Resp 11 04/22/23 1117  SpO2 96 % 04/22/23 1117    Last Pain:  Vitals:   04/22/23 0801  TempSrc:   PainSc: 8          Complications: No notable events documented.

## 2023-04-22 NOTE — Anesthesia Postprocedure Evaluation (Signed)
 Anesthesia Post Note  Patient: JAHVON GOSLINE  Procedure(s) Performed: TOTAL KNEE ARTHROPLASTY - RNFA (Right: Knee)  Patient location during evaluation: PACU Anesthesia Type: Spinal Level of consciousness: awake and alert Pain management: pain level controlled Vital Signs Assessment: post-procedure vital signs reviewed and stable Respiratory status: spontaneous breathing, nonlabored ventilation and respiratory function stable Cardiovascular status: blood pressure returned to baseline and stable Postop Assessment: no apparent nausea or vomiting Anesthetic complications: no   No notable events documented.   Last Vitals:  Vitals:   04/22/23 1232 04/22/23 1344  BP: 113/79 114/78  Pulse: 79 76  Resp: 18 18  Temp: 37.1 C 37.1 C  SpO2: 98% 97%    Last Pain:  Vitals:   04/22/23 1344  TempSrc: Temporal  PainSc: 0-No pain                 Foye Deer

## 2023-04-22 NOTE — Op Note (Signed)
 04/22/2023  11:21 AM  Patient:   Gary Schultz  Pre-Op Diagnosis:   Degenerative joint disease, right knee.  Post-Op Diagnosis:   Same  Procedure:   Right TKA using all-pressfit Zimmer Persona system with a #8 PCR femur, a(n) G-sized  tibial tray with a 12 mm medial congruent E-poly insert, and a 10 x 38 mm all-poly 3-pegged domed patella.  Surgeon:   Maryagnes Amos, MD  Assistant:   Horris Latino, PA-C   Anesthesia:   Spinal  Findings:   As above  Complications:   None  EBL:   10 cc  Fluids:   400 cc crystalloid  UOP:   None  TT:   105 minutes at 300 mmHg  Drains:   None  Closure:   Staples  Implants:   As above  Brief Clinical Note:   The patient is a 58 year old male with a long history of progressively worsening right knee pain. The patient's symptoms have progressed despite medications, activity modification, injections, etc. The patient's history and examination were consistent with advanced degenerative joint disease of the right knee confirmed by plain radiographs and preoperative MRI scanning. The patient presents at this time for a right total knee arthroplasty.  Procedure:   The patient was brought into the operating room. After adequate spinal anesthesia was obtained, the patient was repositioned in the supine position on the operating room table. The right lower extremity was prepped with ChloraPrep solution and draped sterilely. Preoperative antibiotics were administered. A timeout was performed to verify the appropriate surgical site before the limb was exsanguinated with an Esmarch and the tourniquet inflated to 300 mmHg.   A standard anterior approach to the knee was made through an approximately 6-7 inch incision. The incision was carried down through the subcutaneous tissues to expose superficial retinaculum. This was split the length of the incision and the medial flap elevated sufficiently to expose the medial retinaculum. The medial retinaculum was  incised, leaving a 3-4 mm cuff of tissue on the patella. This was extended distally along the medial border of the patellar tendon and proximally through the medial third of the quadriceps tendon. A subtotal fat pad excision was performed before the soft tissues were elevated off the anteromedial and anterolateral aspects of the proximal tibia to the level of the collateral ligaments. The anterior portions of the medial and lateral menisci were removed, as was the anterior cruciate ligament. With the knee flexed to 90, the external tibial guide was positioned and the appropriate proximal tibial cut made. This piece was taken to the back table where it was measured and found to be optimally replicated by a(n) G-sized component.  Attention was directed to the distal femur. The intramedullary canal was accessed through a 3/8" drill hole. The intramedullary guide was inserted and placed at 5 of valgus alignment. Using the +0 slot, the distal cut was made. The distal femur was measured and found to be optimally replicated by the #8 component. The #8 4-in-1 cutting block was positioned and first the posterior, then the posterior chamfer, the anterior, and finally the anterior chamfer cuts were made after verifying that the anterior cortex would not be notched.   At this point, the posterior portions medial and lateral menisci were removed. A trial reduction was performed using the appropriate femoral and tibial components with first the 10 mm and then the 12 mm insert. The 12 mm insert demonstrated excellent stability to varus and valgus stressing both in flexion and extension  while permitting full extension. Patellar tracking was assessed and found to be excellent. The tibial trial position was marked on the proximal tibia. The patella thickness was measured and found to be 20 mm, so the appropriate cut was made. The patellar surface was measured and found to be optimally replicated by the 38 mm component. The  three peg holes were drilled in place before the trial button was inserted. Patella tracking was assessed and found to be excellent, passing the "no thumb test". The lug holes were drilled into the distal femur before the trial component was removed.  The tibial tray was repositioned before the keel was created using the appropriate tower, drills, and punch.  The bony surfaces were prepared for implantation by irrigating them thoroughly with sterile saline solution via the jet lavage system. A bone plug was fashioned from some of the bone that had been removed previously and used to plug the distal femoral canal. In addition, a "cocktail" of 20 cc of Exparel, 30 cc of 0.5% Sensorcaine, 2 cc of Kenalog 40 (80 mg), and 30 mg of Toradol diluted out to 90 cc with normal saline was injected into the postero-medial and postero-lateral aspects of the knee, the medial and lateral gutter regions, and the peri-incisional tissues to help with postoperative analgesia.    The tibial tray was impacted into place first with care taken to be sure the component was fully seated. Next, the femoral component was impacted into place again with care taken to be sure that the component was fully improperly seated. The permanent 12 mm medial congruent E-polyethylene insert was snapped into place with care taken to ensure appropriate locking of the insert. Finally, the patella was positioned and compressed into place using the patellar clamp. Again, care was taken to be sure that the component was fully seated. The knee was placed through a range of motion with the findings as described above.    The wound was copiously irrigated with sterile saline solution using the jet lavage system before the quadriceps tendon and retinacular layer were reapproximated using #0 Vicryl interrupted sutures. The superficial retinacular layer also was closed using a running #0 Vicryl suture. The subcutaneous tissues were closed in several layers using  2-0 Vicryl interrupted sutures. The skin was closed using staples. A sterile honeycomb dressing was applied to the skin before the leg was wrapped with an Ace wrap to accommodate the Polar Care device. The patient was then awakened and returned to the recovery room in satisfactory condition after tolerating the procedure well.

## 2023-04-22 NOTE — TOC Progression Note (Signed)
 Transition of Care [facility]) - Progression Note    Patient Details  Name: Gary Schultz MRN: [id number] Date of Birth: 05/10/1965  Transition of Care [facility]) CM/SW Contact  Marlowe Sax, RN Phone Number: 04/22/2023, 11:53 AM  Clinical Narrative:     Adapt to deliver 3 in 1 and RW to the bedside [facility] set up prior to surgery for [facility] by the surgeons office       Expected Discharge Plan and Services                                               Social Determinants of Health (SDOH) Interventions SDOH Screenings   Food Insecurity: Food Insecurity Present (01/07/2023)  Housing: Unknown (04/14/2023)   Received from [facility]  Transportation Needs: No Transportation Needs (01/07/2023)  Alcohol Screen: Low Risk  (01/07/2023)  Depression (PHQ2-9): Medium Risk (01/07/2023)  Financial Resource Strain: Medium Risk (01/07/2023)  Physical Activity: Unknown (01/07/2023)  Social Connections: Moderately Integrated (01/07/2023)  Stress: No Stress Concern Present (01/07/2023)  Tobacco Use: Low Risk  (04/22/2023)    Readmission Risk Interventions     No data to display

## 2023-04-22 NOTE — Anesthesia Preprocedure Evaluation (Addendum)
 Anesthesia Evaluation  Patient identified by MRN, date of birth, ID band Patient awake    Reviewed: Allergy & Precautions, H&P , NPO status , Patient's Chart, lab work & pertinent test results  Airway Mallampati: III  TM Distance: >3 FB Neck ROM: full    Dental no notable dental hx.    Pulmonary neg pulmonary ROS   Pulmonary exam normal        Cardiovascular Exercise Tolerance: Good hypertension, Pt. on medications Normal cardiovascular exam     Neuro/Psych  PSYCHIATRIC DISORDERS Anxiety Depression    Chronic right-sided low back pain with right-sided sciatica Chronic opioid use  Neuromuscular disease    GI/Hepatic Neg liver ROS,GERD  Medicated and Controlled,,  Endo/Other  negative endocrine ROS    Renal/GU    BPH with LU TS    Musculoskeletal  (+) Arthritis ,    Abdominal Normal abdominal exam  (+)   Peds  Hematology negative hematology ROS (+)   Anesthesia Other Findings Past Medical History: No date: Anxiety No date: Carpal tunnel syndrome     Comment:  bilateral No date: DDD (degenerative disc disease), lumbar No date: Generalized headaches     Comment:  from neck injury No date: GERD (gastroesophageal reflux disease) No date: Hypertension No date: Neck pain No date: Neuromuscular disorder (HCC) No date: Varicose veins  Past Surgical History: 2006: BACK SURGERY     Comment:  L4-L5 herniated disc repair 12/22/2019: CARPAL TUNNEL RELEASE; Right     Comment:  Procedure: CARPAL TUNNEL RELEASE ENDOSCOPIC;  Surgeon:               Christena Flake, MD;  Location: [facility] ORS;  Service:               Orthopedics;  Laterality: Right; 02/29/2020: CARPAL TUNNEL RELEASE; Left     Comment:  Procedure: CARPAL TUNNEL RELEASE ENDOSCOPIC;  Surgeon:               Christena Flake, MD;  Location: [facility] OR[facility];  Service:               Orthopedics;  Laterality: Left; No date: CLAVICLE SURGERY; Right 06/08/2020: COLONOSCOPY  WITH PROPOFOL; N/A     Comment:  Procedure: COLONOSCOPY WITH PROPOFOL;  Surgeon: Midge Minium, MD;  Location: [facility];  Service:               Endoscopy;  Laterality: N/A; 2019: FRACTURE SURGERY; Left 09/28/2018: KNEE ARTHROSCOPY WITH MEDIAL MENISECTOMY; Left     Comment:  Procedure: KNEE ARTHROSCOPY WITH DEBRIDEMENT, abrasion               chondroplasty of patella, PARTIAL MEDIAL MENISCECTOMY;                Surgeon: Christena Flake, MD;  Location: [facility];                Service: Orthopedics;  Laterality: Left; 12/07/2018: KNEE ARTHROSCOPY WITH MEDIAL MENISECTOMY; Right     Comment:  Procedure: RIGHT KNEE ARTHROSCOPY WITH DEBRIDEMENT,               REPAIR, PARTIAL MEDIAL MENISECTOMY;  Surgeon: Christena Flake, MD;  Location: [facility];  Service: Orthopedics;                Laterality: Right; 03/29/2019:  REPAIR OF PERONEUS BREVIS TENDON; Left     Comment:  Procedure: Debridement of chronic peroneus brevis               tendinopathy with reconstruction of calcaneofibular and               anterior talofibular ligaments;  Surgeon: Christena Flake,               MD;  Location: [facility];  Service: Orthopedics;                Laterality: Left; 1992: SHOULDER SURGERY; Right     Reproductive/Obstetrics negative OB ROS                             Anesthesia Physical Anesthesia Plan  ASA: 2  Anesthesia Plan: Spinal   Post-op Pain Management:    Induction:   PONV Risk Score and Plan: Propofol infusion  Airway Management Planned:   Additional Equipment:   Intra-op Plan:   Post-operative Plan:   Informed Consent: I have reviewed the patients History and Physical, chart, labs and discussed the procedure including the risks, benefits and alternatives for the proposed anesthesia with the patient or authorized representative who has indicated his/her understanding and acceptance.     Dental Advisory Given  Plan Discussed  with: CRNA and Surgeon  Anesthesia Plan Comments:         Anesthesia Quick Evaluation

## 2023-04-22 NOTE — H&P (Signed)
 History of Present Illness:  Gary Schultz is a 58 y.o. male who has severe right knee pain. The patient has a longstanding history of bilateral knee pain, his right being worse than his left. He states that his pain today is at a 7/10. He continues to utilize chronic pain medication for control of his discomfort as prescribed by a pain clinic provider. He states that the pain localizes along the medial and lateral compartments of the knee. Denies any numbness, tingling or radiation symptoms today. He has failed conservative treatment including Lidoderm patches, Voltaren gel, Neurontin, previous intra-articular corticosteroid injections . He is not currently utilizing any ambulatory aids. He has requested operative intervention for relief of his DJD symptoms. Of note, patient has a history of arthroscopic surgeries on both his right and left knee. He denies having any history of any cardiac or pulmonary issues. No histories of DVTs or clots. Patient is not a diabetic.  Social Hx: Patient works as a Curator. Lives at home with his wife who will plan to take off a week of work to help take care of him postoperatively. He denies any illicit drug use or alcohol use. Patient does not smoke or use any tobacco or nicotine based products.  Allergies: Acetaminophen Hives, Itching and Rash, Stomach pain   Current Outpatient Medications:  amoxicillin-clavulanate (AUGMENTIN) 875-125 mg tablet Take 1 tablet by mouth every 12 (twelve) hours  BD LUER-LOK SYRINGE 3 mL 23 gauge x 1 1/2" Syrg USE TO ADMINSTER TESTOSTERONE EVERY 2 WEEKS  BD REGULAR BEVEL NEEDLES 18 gauge x 1 1/2" Ndle USE TO ADMINISTER TESTOSTERONE EVERY 14 DAYS  chlorpheniramine/dextromethorp (CORICIDIN HBP COUGH AND COLD ORAL) Take 1 tablet by mouth every 6 (six) hours as needed  diclofenac (VOLTAREN) 1 % topical gel Apply 2 g topically 4 (four) times daily  fluticasone propionate (FLONASE) 50 mcg/actuation nasal spray Place 2 sprays into both  nostrils once daily as needed  gabapentin (NEURONTIN) 600 MG tablet Take 1,800 mg by mouth 3 (three) times daily  lidocaine (LIDODERM) 5 % patch Place 1 patch onto the skin once daily  lisinopriL (ZESTRIL) 30 MG tablet Take 30 mg by mouth once daily  omeprazole (PRILOSEC) 20 MG DR capsule TAKE 1 CAPSULE BY MOUTH ONCE DAILY BEFORE BREAKFAST  oxyCODONE (DAZIDOX) 20 mg immediate release tablet Take by mouth Take 20 mg by mouth 5 (five) times daily as needed (pain).  phenol (ORAL RELIEF SORE THROAT SPRAY MM) 1 spray by Mucous Membrane route at bedtime as needed (FOR SORE THROAT)  predniSONE (DELTASONE) 10 MG tablet Take 6 tabs on days 1,2. Take 5 tabs on days 3,4. Take 4 tabs days 5,6. Take 3 tabs days 7,8. Take 2 tabs days 9,10. Take 1 tab days 11,12. 42 tablet 0  testosterone cypionate (DEPO-TESTOSTERONE) 200 mg/mL injection INJECT 1 ML (200 MG TOTAL) INTO THE MUSCLE EVERY 14 DAYS  tadalafiL (CIALIS) 20 MG tablet Take by mouth once as needed (Patient not taking: Reported on 04/14/2023)  venlafaxine (EFFEXOR) 75 MG tablet Take 1 tablet (75 mg total) by mouth 2 (two) times daily (Patient not taking: Reported on 04/14/2023) 60 tablet 5   Past Medical History:  Chickenpox  DDD (degenerative disc disease), lumbar  Hypertension   Past Surgical History:  L4-5 discectomy 11/2008  Arthroscopic partial medial meniscectomy with abrasion chondroplasty of grade 2-3 chondromalacial changes of patella, left knee Left 09/28/2018 (Dr. Joice Lofts)  Arthroscopic partial medial meniscectomy and arthroscopic abrasion chondroplasty, right knee. Right 12/07/2018 (Dr. Joice Lofts)  Debridement of chronic peroneus brevis tendinopathy with reconstruction of calcaneofibular and anterior talofibular ligaments, left ankle. Left 03/29/2019 (Dr. Joice Lofts)  Endoscopic right carpal tunnel release. Right 12/22/2019 (Dr. Joice Lofts)  Endoscopic left carpal tunnel release. Left 02/29/2020 (Dr. Joice Lofts)  Right Shoulder Surgery   Physical Exam:   Ht:185.4 cm (6\' 1" ) Wt:91.3 kg (201 lb 3.2 oz) BMI: Body mass index is 26.55 kg/m.  General/Constitutional: No apparent distress: well-nourished and well developed. Eyes: Pupils equal, round with synchronous movement. Lymphatic: No palpable adenopathy. Respiratory: Patient has good chest rise and fall with inspiration and expiration. All lung fields are clear to auscultation bilaterally. There is no Rales, rhonchi or wheezes appreciated. Cardiovascular: Upon auscultation there is a regular rate and rhythm without any murmurs, rubs, gallops or heaves appreciated. There does not appear to be any swelling down the lower extremities. Posterior tibial pulses appreciated bilaterally, 2+. Integumentary: No impressive skin lesions present, except as noted in detailed exam. Neuro/Psych: Normal mood and affect, oriented to person, place and time. Musculoskeletal: see exam below  Right knee exam: Upon inspection of the patient's right knee there does not appear to be any skin changes, swelling or redness. Patient is noted to have a small abrasion with overlying scab formation just inferior to the tibial tubercle, approximately the size of a penny, no active drainage or surrounding erythema noted. There is a varus alignment. Upon palpation, the patient reports having pain along the lateral and medial aspect of their knee. Patient has 10 degrees off of full extension actively with ROM, and able to flex back to 104 with mild pain. Varus and valgus stress testing shows pseudolaxity to valgus stressing. The patella tracks well within the femoral groove from flexion into extension with mild crepitance appreciated. Anterior and posterior drawer testing negative. Patient is neurovascularly intact down their lower extremity to all dermatomes. Posterior tibial pulses appreciated 2+.  Imaging:  MRI Right Knee::  1. Attenuation of the body and posterior horn of the medial meniscus  consistent with prior partial  meniscectomy. Peripheral meniscal  extrusion of the residual body of the medial meniscus. Oblique  linear signal in the residual posterior horn medial meniscus  concerning for a tear.  2. Tiny vertical tear of the inner third of the body of the lateral  meniscus.  3. Extensive full-thickness cartilage loss of the weight-bearing  medial femorotibial compartment with subchondral marrow edema.  4. Mild-moderate partial-thickness cartilage loss of the medial  patellofemoral compartment.  5. Moderate joint effusion with synovitis.   Assessment: 1. Primary osteoarthritis of right knee.   Plan: The treatment options were discussed with the patient. In addition, patient educational materials were provided regarding the diagnosis and treatment options. The patient is quite frustrated by his symptoms and function limitations and is ready to consider more aggressive treatment options. Based on his MRI findings, I do not feel that he is a candidate for a partial knee replacement. Therefore, I have recommended a total knee arthroplasty. The procedure was discussed with the patient, as were the potential risks (including bleeding, infection, nerve and/or blood vessel injury, persistent or recurrent pain, loosening and/or failure of the components, dislocation, need for further surgery, blood clots, strokes, heart attacks and/or arhythmias, pneumonia, etc.) and benefits. The patient states his/her understanding and wishes to proceed. All of the patient's questions and concerns were answered. He can call any time with further concerns. He will follow up post-surgery, routine.     H&P reviewed and patient re-examined. No changes.

## 2023-04-22 NOTE — Progress Notes (Cosign Needed)
 Patient is not able to walk the distance required to go the bathroom, or he/she is unable to safely negotiate stairs required to access the bathroom.  A 3in1 BSC will alleviate this problem

## 2023-04-22 NOTE — Discharge Instructions (Addendum)
 Orthopedic discharge instructions: May shower with intact OpSite dressing. Apply ice frequently to knee or use Polar Care. Start Eliquis 1 tablet (2.5 mg) twice daily on Thursday, 04/23/2023, for 2 weeks, then take aspirin 325 mg twice daily for 4 weeks. Take pain medication as prescribed when needed.  May supplement with ES Tylenol if necessary. May weight-bear as tolerated on right leg - use walker for balance and support. Follow-up in 10-14 days or as scheduled.  Information for Discharge Teaching: DO NOT REMOVE TEAL EXPAREL BRACELET FOR 4 DAYS, (96 hours) 04/26/2023 EXPAREL (bupivacaine liposome injectable suspension)   Pain relief is important to your recovery. The goal is to control your pain so you can move easier and return to your normal activities as soon as possible after your procedure. Your physician may use several types of medicines to manage pain, swelling, and more.  Your surgeon or anesthesiologist gave you EXPAREL(bupivacaine) to help control your pain after surgery.  EXPAREL is a local anesthetic designed to release slowly over an extended period of time to provide pain relief by numbing the tissue around the surgical site. EXPAREL is designed to release pain medication over time and can control pain for up to 72 hours. Depending on how you respond to EXPAREL, you may require less pain medication during your recovery. EXPAREL can help reduce or eliminate the need for opioids during the first few days after surgery when pain relief is needed the most. EXPAREL is not an opioid and is not addictive. It does not cause sleepiness or sedation.   Important! A teal colored band has been placed on your arm with the date, time and amount of EXPAREL you have received. Please leave this armband in place for the full 96 hours following administration, and then you may remove the band. If you return to the hospital for any reason within 96 hours following the administration of EXPAREL, the  armband provides important information that your health care providers to know, and alerts them that you have received this anesthetic.    Possible side effects of EXPAREL: Temporary loss of sensation or ability to move in the area where medication was injected. Nausea, vomiting, constipation Rarely, numbness and tingling in your mouth or lips, lightheadedness, or anxiety may occur. Call your doctor right away if you think you may be experiencing any of these sensations, or if you have other questions regarding possible side effects.  Follow all other discharge instructions given to you by your surgeon or nurse. Eat a healthy diet and drink plenty of water or other fluids.Information for Discharge Teaching: EXPAREL (bupivacaine liposome injectable suspension)   Pain relief is important to your recovery. The goal is to control your pain so you can move easier and return to your normal activities as soon as possible after your procedure. Your physician may use several types of medicines to manage pain, swelling, and more.  Your surgeon or anesthesiologist gave you EXPAREL(bupivacaine) to help control your pain after surgery.  EXPAREL is a local anesthetic designed to release slowly over an extended period of time to provide pain relief by numbing the tissue around the surgical site. EXPAREL is designed to release pain medication over time and can control pain for up to 72 hours. Depending on how you respond to EXPAREL, you may require less pain medication during your recovery. EXPAREL can help reduce or eliminate the need for opioids during the first few days after surgery when pain relief is needed the most. EXPAREL is not  an opioid and is not addictive. It does not cause sleepiness or sedation.   Important! A teal colored band has been placed on your arm with the date, time and amount of EXPAREL you have received. Please leave this armband in place for the full 96 hours following administration,  and then you may remove the band. If you return to the hospital for any reason within 96 hours following the administration of EXPAREL, the armband provides important information that your health care providers to know, and alerts them that you have received this anesthetic.    Possible side effects of EXPAREL: Temporary loss of sensation or ability to move in the area where medication was injected. Nausea, vomiting, constipation Rarely, numbness and tingling in your mouth or lips, lightheadedness, or anxiety may occur. Call your doctor right away if you think you may be experiencing any of these sensations, or if you have other questions regarding possible side effects.  Follow all other discharge instructions given to you by your surgeon or nurse. Eat a healthy diet and drink plenty of water or other fluids.  POLAR CARE INFORMATION  MassAdvertisement.it  How to use [facility] Polar Care [facility] Therapy System?  YouTube   ShippingScam.co.uk  OPERATING INSTRUCTIONS  Start the product With dry hands, connect the transformer to the electrical connection located on the top of the cooler. Next, plug the transformer into an appropriate electrical outlet. The unit will automatically start running at this point.  To stop the pump, disconnect electrical power.  Unplug to stop the product when not in use. Unplugging the Polar Care unit turns it off. Always unplug immediately after use. Never leave it plugged in while unattended. Remove pad.    FIRST ADD WATER TO FILL LINE, THEN ICE---Replace ice when existing ice is almost melted  1 Discuss Treatment with your Licensed Health Care Practitioner and Use Only as Prescribed 2 Apply Insulation Barrier & Cold Therapy Pad 3 Check for Moisture 4 Inspect Skin Regularly  Tips and Trouble Shooting Usage Tips 1. Use cubed or chunked ice for optimal performance. 2. It is recommended to drain the Pad between uses. To drain the pad, hold  the Pad upright with the hose pointed toward the ground. Depress the black plunger and allow water to drain out. 3. You may disconnect the Pad from the unit without removing the pad from the affected area by depressing the silver tabs on the hose coupling and gently pulling the hoses apart. The Pad and unit will seal itself and will not leak. Note: Some dripping during release is normal. 4. DO NOT RUN PUMP WITHOUT WATER! The pump in this unit is designed to run with water. Running the unit without water will cause permanent damage to the pump. 5. Unplug unit before removing lid.  TROUBLESHOOTING GUIDE Pump not running, Water not flowing to the pad, Pad is not getting cold 1. Make sure the transformer is plugged into the wall outlet. 2. Confirm that the ice and water are filled to the indicated levels. 3. Make sure there are no kinks in the pad. 4. Gently pull on the blue tube to make sure the tube/pad junction is straight. 5. Remove the pad from the treatment site and ll it while the pad is lying at; then reapply. 6. Confirm that the pad couplings are securely attached to the unit. Listen for the double clicks (Figure 1) to confirm the pad couplings are securely attached.  Leaks    Note: Some condensation on  the lines, controller, and pads is unavoidable, especially in warmer climates. 1. If using a B[employer] Polar Care Cold Therapy unit with a detachable Cold Therapy Pad, and a leak exists (other than condensation on the lines) disconnect the pad couplings. Make sure the silver tabs on the couplings are depressed before reconnecting the pad to the pump hose; then confirm both sides of the coupling are properly clicked in. 2. If the coupling continues to leak or a leak is detected in the pad itself, stop using it and call [employer] Customer Care at [phone].  Cleaning After use, empty and dry the unit with a soft cloth. Warm water and mild detergent may be used occasionally to clean the pump and  tubes.  WARNING: The Polar Care Cube can be cold enough to cause serious injury, including full skin necrosis. Follow these Operating Instructions, and carefully read the Product Insert (see pouch on side of unit) and the Cold Therapy Pad Fitting Instructions (provided with each Cold Therapy Pad) prior to use.

## 2023-04-22 NOTE — Anesthesia Procedure Notes (Addendum)
 Spinal  Patient location during procedure: OR Start time: 04/22/2023 7:33 AM End time: 04/22/2023 7:41 AM Reason for block: surgical anesthesia Staffing Performed: resident/CRNA  Resident/CRNA: Otho Perl, CRNA Performed by: Otho Perl, CRNA Authorized by: Foye Deer, MD   Preanesthetic Checklist Completed: patient identified, IV checked, site marked, risks and benefits discussed, surgical consent, monitors and equipment checked, pre-op evaluation and timeout performed Spinal Block Patient position: sitting Prep: ChloraPrep Patient monitoring: heart rate, cardiac monitor, continuous pulse ox and blood pressure Approach: right paramedian Location: L2-3 Injection technique: single-shot Needle Needle type: Quincke  Needle gauge: 22 G Needle length: 9 cm Needle insertion depth: 8.5 cm Assessment Sensory level: T10 Events: CSF return Additional Notes Attempts x 3, midline at L3-4 and L2-3 without success, then right paramedian at L2-3 with success.  Pt tol poorly, required quite a bit of sedation for spinal  ZOX#[id number] Exp. 6/26

## 2023-04-23 ENCOUNTER — Encounter: Payer: Self-pay | Admitting: Surgery

## 2023-05-04 ENCOUNTER — Encounter: Payer: Self-pay | Admitting: Intensive Care

## 2023-05-04 ENCOUNTER — Other Ambulatory Visit: Payer: Self-pay

## 2023-05-04 ENCOUNTER — Emergency Department
Admission: EM | Admit: 2023-05-04 | Discharge: 2023-05-04 | Disposition: A | Discharge status: Home / Self Care | Attending: Emergency Medicine | Admitting: Emergency Medicine

## 2023-05-04 ENCOUNTER — Emergency Department

## 2023-05-04 DIAGNOSIS — M5441 Lumbago with sciatica, right side: Secondary | ICD-10-CM | POA: Insufficient documentation

## 2023-05-04 DIAGNOSIS — Z96651 Presence of right artificial knee joint: Secondary | ICD-10-CM | POA: Diagnosis not present

## 2023-05-04 DIAGNOSIS — M79604 Pain in right leg: Secondary | ICD-10-CM | POA: Diagnosis present

## 2023-05-04 DIAGNOSIS — M5431 Sciatica, right side: Secondary | ICD-10-CM

## 2023-05-04 LAB — COMPREHENSIVE METABOLIC PANEL
ALT: 19 U/L (ref 0–44)
AST: 19 U/L (ref 15–41)
Albumin: 3.9 g/dL (ref 3.5–5.0)
Alkaline Phosphatase: 65 U/L (ref 38–126)
Anion gap: 8 (ref 5–15)
BUN: 23 mg/dL — ABNORMAL HIGH (ref 6–20)
CO2: 24 mmol/L (ref 22–32)
Calcium: 9.1 mg/dL (ref 8.9–10.3)
Chloride: 103 mmol/L (ref 98–111)
Creatinine, Ser: 0.92 mg/dL (ref 0.61–1.24)
GFR, Estimated: >60 mL/min (ref >60)
Glucose, Bld: 108 mg/dL — ABNORMAL HIGH (ref 70–99)
Potassium: 4.5 mmol/L (ref 3.5–5.1)
Sodium: 135 mmol/L (ref 135–145)
Total Bilirubin: 1.1 mg/dL (ref 0.0–1.2)
Total Protein: 6.9 g/dL (ref 6.5–8.1)

## 2023-05-04 LAB — CBC WITH DIFFERENTIAL/PLATELET
Abs Immature Granulocytes: 0.06 10*3/uL (ref 0.00–0.07)
Basophils Absolute: 0.1 10*3/uL (ref 0.0–0.1)
Basophils Relative: 0 %
Eosinophils Absolute: 0 10*3/uL (ref 0.0–0.5)
Eosinophils Relative: 0 %
HCT: 40.7 % (ref 39.0–52.0)
Hemoglobin: 14 g/dL (ref 13.0–17.0)
Immature Granulocytes: 1 %
Lymphocytes Relative: 11 %
Lymphs Abs: 1.3 10*3/uL (ref 0.7–4.0)
MCH: 30.4 pg (ref 26.0–34.0)
MCHC: 34.4 g/dL (ref 30.0–36.0)
MCV: 88.5 fL (ref 80.0–100.0)
Monocytes Absolute: 1 10*3/uL (ref 0.1–1.0)
Monocytes Relative: 8 %
Neutro Abs: 9.6 10*3/uL — ABNORMAL HIGH (ref 1.7–7.7)
Neutrophils Relative %: 80 %
Platelets: 266 10*3/uL (ref 150–400)
RBC: 4.6 MIL/uL (ref 4.22–5.81)
RDW: 12.7 % (ref 11.5–15.5)
WBC: 11.9 10*3/uL — ABNORMAL HIGH (ref 4.0–10.5)
nRBC: 0 % (ref 0.0–0.2)

## 2023-05-04 MED ORDER — DEXAMETHASONE SODIUM PHOSPHATE 10 MG/ML IJ SOLN
10.0000 mg | Freq: Once | INTRAMUSCULAR | Status: AC
  Administered 2023-05-04: 10 mg via INTRAMUSCULAR
  Filled 2023-05-04: qty 1

## 2023-05-04 MED ORDER — HYDROMORPHONE HCL 1 MG/ML IJ SOLN
1.0000 mg | Freq: Once | INTRAMUSCULAR | Status: AC
  Administered 2023-05-04: 1 mg via INTRAMUSCULAR
  Filled 2023-05-04: qty 1

## 2023-05-04 MED ORDER — KETOROLAC TROMETHAMINE 15 MG/ML IJ SOLN
15.0000 mg | Freq: Once | INTRAMUSCULAR | Status: AC
  Administered 2023-05-04: 15 mg via INTRAMUSCULAR
  Filled 2023-05-04: qty 1

## 2023-05-04 NOTE — ED Notes (Signed)
 Patient in [place] at this time

## 2023-05-04 NOTE — Discharge Instructions (Signed)
 Please call the number provided for Dr. Mariah Milling to arrange a follow-up appointment for further evaluation and consideration of possible corticosteroid injections.  Please call your pain management doctor as they may be able to arrange this as well.  Return to the emergency department for any significant worsening of pain any numbness of the leg or loss of control of your bowels or any other symptom personally concerning to yourself.

## 2023-05-04 NOTE — ED Triage Notes (Addendum)
 Patient presents with tender right thigh, redness, and pain.  Patient had right knee surgery on March 5th. Pain medication is providing no relief. Reports weight loss since surgery  Reports chronic right lower back and leg pain that was controllable with meds before surgery. Sees pain management in [facility]   Patient reports he was sent by doctor to rule out blood clot in right leg

## 2023-05-04 NOTE — ED Provider Notes (Signed)
-----------------------------------------   2:13 PM on 05/04/2023 ----------------------------------------- I have personally seen and evaluated the patient in conjunction with physician assistant Cruz Condon.  Patient had a recent knee replacement by Dr.Poggi, has a history of chronic back pain for which she takes oxycodone chronically.  Has had significant worsening of his back pain since the surgery.  Patient describes the pain as a sharp electrical-like shooting pain shooting down the right leg at times.  Symptoms are very suggestive of sciatica.  I reviewed the patient's MRI from 1 year ago which did show mild bulging disc at that time.  Patient denies any significant trauma.  Patient's ultrasound today is negative for DVT but does show hematoma likely from the patient's recent knee surgery.  CBC and chemistry are reassuring.  Patient received pain medication in the emergency department as well as steroid, and Toradol.  Patient states minimal relief.  I discussed with the patient unfortunately as he is seeing pain management we are unable to adjust his home pain medications and he would need to follow-up with his pain management physician.  I do believe the patient might benefit from corticosteroid injections and we will refer to PM&R.  Patient states he will also follow-up with his pain management specialist.  No red flags on my evaluation/history to suggest cord compression or cauda equina.  We will discharge with outpatient follow-up, provided my typical sciatica return precautions.   Minna Antis, MD 05/04/23 1415

## 2023-05-04 NOTE — ED Provider Notes (Signed)
 [facility] Provider Note    Event Date/Time   First MD Initiated Contact with Patient 05/04/23 1105     (approximate)   History   Leg Pain   HPI  SHRIYAN Schultz is a 58 y.o. male with PMH of degenerative disc disease, anxiety with recent right TKA on 3/5 presents for evaluation of right leg pain.  Patient states that he has pain to the right thigh.  He is also having severe lower back pain that radiates down the right leg all the way to the bottom of his foot.  He endorses numbness in the foot.  He states that the pain is sharp.  It also sometimes radiates down the left leg.  Patient has been prescribed 20 mg of oxycodone for pain which she has been taking appropriately and this is not controlling his pain.  He also states that he has had a 30 pound weight loss since his surgery on the fifth.  Both patient and patient's family member endorse that he has been eating normally.  No vomiting or diarrhea.  He also endorses night sweats.      Physical Exam   Triage Vital Signs: ED Triage Vitals  Encounter Vitals Group     BP 05/04/23 1021 138/84     Systolic BP Percentile --      Diastolic BP Percentile --      Pulse Rate 05/04/23 1021 100     Resp 05/04/23 1021 18     Temp 05/04/23 1021 98.5 F (36.9 C)     Temp Source 05/04/23 1021 Oral     SpO2 05/04/23 1021 100 %     Weight 05/04/23 1022 180 lb (81.6 kg)     Height 05/04/23 1022 6' (1.829 m)     Head Circumference --      Peak Flow --      Pain Score 05/04/23 1022 10     Pain Loc --      Pain Education --      Exclude from Growth Chart --     Most recent vital signs: Vitals:   05/04/23 1021  BP: 138/84  Pulse: 100  Resp: 18  Temp: 98.5 F (36.9 C)  SpO2: 100%   General: Awake, significant pain, twitching and jerking when he has  jolt of pain CV:  Good peripheral perfusion.  Resp:  Normal effort.  Abd:  No distention. Other:  Tibialis posterior pulse is 2+ and regular. Decreased  sensation in the right lower extremity. Post surgical dressing in place on the right knee, bruising to the medial knee and medial thigh. Difficult to assess patients ROM due to his level of pain.    ED Results / Procedures / Treatments   Labs (all labs ordered are listed, but only abnormal results are displayed) Labs Reviewed  CBC WITH DIFFERENTIAL/PLATELET - Abnormal; Notable for the following components:      Result Value   WBC 11.9 (*)    Neutro Abs 9.6 (*)    All other components within normal limits  COMPREHENSIVE METABOLIC PANEL - Abnormal; Notable for the following components:   Glucose, Bld 108 (*)    BUN 23 (*)    All other components within normal limits    PROCEDURES:  Critical Care performed: No  Procedures   MEDICATIONS ORDERED IN ED: Medications  HYDROmorphone (DILAUDID) injection 1 mg (1 mg Intramuscular Given 05/04/23 1246)  ketorolac (TORADOL) 15 MG/ML injection 15 mg (15 mg Intramuscular Given  05/04/23 1328)  dexamethasone (DECADRON) injection 10 mg (10 mg Intramuscular Given 05/04/23 1330)     IMPRESSION / MDM / ASSESSMENT AND PLAN / ED COURSE  I reviewed the triage vital signs and the nursing notes.                             58 year old male presents for evaluation of right lower back pain and right leg pain. VSS but patient is quite uncomfortable on exam.   Differential diagnosis includes, but is not limited to, DVT, sciatica, chronic pain, post surgical pain.  Patient's presentation is most consistent with acute complicated illness / injury requiring diagnostic workup.  CMP and CBC are unremarkable. [place] was negative for a DVT.   I believe patient's pain is an exacerbation of his chronic back pain causing pain along the sciatic nerve. Patient does not have sign concerning for an acute spinal cord compression like saddle anesthesia, loss of bladder and bowel control. I do not feel that imaging is needed emergently at this point in time. Patient is seen  by pain management so he will not be sent home with prescriptions for pain medication today. He was given dilaudid, dexamethasone and toradol for pain while in the ED.  I saw this patient in conjunction with my attending physician, Dr. Lenard Lance.   Discussed outpatient work up for the weight loss with the patient. Patient was given follow up information and was discharged in stable condition.      FINAL CLINICAL IMPRESSION(S) / ED DIAGNOSES   Final diagnoses:  Sciatica of right side     Rx / DC Orders   ED Discharge Orders     None        Note:  This document was prepared using Dragon voice recognition software and may include unintentional dictation errors.   Cameron Ali, PA-C 05/04/23 1435    Minna Antis, MD 05/04/23 1524

## 2023-05-13 ENCOUNTER — Other Ambulatory Visit: Payer: Self-pay | Admitting: Physical Medicine & Rehabilitation

## 2023-05-13 DIAGNOSIS — M5416 Radiculopathy, lumbar region: Secondary | ICD-10-CM

## 2023-05-14 ENCOUNTER — Ambulatory Visit
Admission: RE | Admit: 2023-05-14 | Discharge: 2023-05-14 | Disposition: A | Discharge status: Home / Self Care | Source: Ambulatory Visit | Attending: Physical Medicine & Rehabilitation | Admitting: Physical Medicine & Rehabilitation

## 2023-05-14 DIAGNOSIS — M5416 Radiculopathy, lumbar region: Secondary | ICD-10-CM

## 2023-05-15 ENCOUNTER — Other Ambulatory Visit: Payer: Self-pay | Admitting: Urology

## 2023-05-15 DIAGNOSIS — E291 Testicular hypofunction: Secondary | ICD-10-CM

## 2023-05-18 ENCOUNTER — Other Ambulatory Visit: Payer: Self-pay | Admitting: Urology

## 2023-05-18 ENCOUNTER — Telehealth: Payer: Self-pay | Admitting: Urology

## 2023-05-18 DIAGNOSIS — E291 Testicular hypofunction: Secondary | ICD-10-CM

## 2023-05-18 MED ORDER — TESTOSTERONE CYPIONATE 200 MG/ML IM SOLN: 200.0000 mg | INTRAMUSCULAR | 0 refills | Status: DC

## 2023-05-18 NOTE — Telephone Encounter (Signed)
 PT called due to Pharmacy has not gotten an response. Pt stated Pharmacy is requesting a month supply of Testerone medication sent. [facility]

## 2023-06-10 ENCOUNTER — Ambulatory Visit (INDEPENDENT_AMBULATORY_CARE_PROVIDER_SITE_OTHER): Admitting: Dermatology

## 2023-06-10 ENCOUNTER — Encounter: Payer: Self-pay | Admitting: Dermatology

## 2023-06-10 DIAGNOSIS — Z1283 Encounter for screening for malignant neoplasm of skin: Secondary | ICD-10-CM | POA: Diagnosis not present

## 2023-06-10 DIAGNOSIS — L814 Other melanin hyperpigmentation: Secondary | ICD-10-CM | POA: Diagnosis not present

## 2023-06-10 DIAGNOSIS — L821 Other seborrheic keratosis: Secondary | ICD-10-CM

## 2023-06-10 DIAGNOSIS — W908XXA Exposure to other nonionizing radiation, initial encounter: Secondary | ICD-10-CM

## 2023-06-10 DIAGNOSIS — I8393 Asymptomatic varicose veins of bilateral lower extremities: Secondary | ICD-10-CM

## 2023-06-10 DIAGNOSIS — L578 Other skin changes due to chronic exposure to nonionizing radiation: Secondary | ICD-10-CM

## 2023-06-10 DIAGNOSIS — D2372 Other benign neoplasm of skin of left lower limb, including hip: Secondary | ICD-10-CM

## 2023-06-10 DIAGNOSIS — D1801 Hemangioma of skin and subcutaneous tissue: Secondary | ICD-10-CM

## 2023-06-10 DIAGNOSIS — D229 Melanocytic nevi, unspecified: Secondary | ICD-10-CM

## 2023-06-10 DIAGNOSIS — I781 Nevus, non-neoplastic: Secondary | ICD-10-CM

## 2023-06-10 DIAGNOSIS — D239 Other benign neoplasm of skin, unspecified: Secondary | ICD-10-CM

## 2023-06-10 DIAGNOSIS — L700 Acne vulgaris: Secondary | ICD-10-CM

## 2023-06-10 NOTE — Patient Instructions (Signed)

## 2023-06-10 NOTE — Progress Notes (Signed)
   Follow-Up Visit   Subjective  ELFORD EVILSIZER is a 58 y.o. male who presents for the following: Skin Cancer Screening and Full Body Skin Exam  The patient presents for Total-Body Skin Exam (TBSE) for skin cancer screening and mole check. The patient has spots, moles and lesions to be evaluated, some may be new or changing and the patient may have concern these could be cancer.  The following portions of the chart were reviewed this encounter and updated as appropriate: medications, allergies, medical history  Review of Systems:  No other skin or systemic complaints except as noted in HPI or Assessment and Plan.  Objective  Well appearing patient in no apparent distress; mood and affect are within normal limits.  A full examination was performed including scalp, head, eyes, ears, nose, lips, neck, chest, axillae, abdomen, back, buttocks, bilateral upper extremities, bilateral lower extremities, hands, feet, fingers, toes, fingernails, and toenails. All findings within normal limits unless otherwise noted below.   Relevant physical exam findings are noted in the Assessment and Plan.    Assessment & Plan   SKIN CANCER SCREENING PERFORMED TODAY.  ACTINIC DAMAGE - Chronic condition, secondary to cumulative UV/sun exposure - diffuse scaly erythematous macules with underlying dyspigmentation - Recommend daily broad spectrum sunscreen SPF 30+ to sun-exposed areas, reapply every 2 hours as needed.  - Staying in the shade or wearing long sleeves, sun glasses (UVA+UVB protection) and wide brim hats (4-inch brim around the entire circumference of the hat) are also recommended for sun protection.  - Call for new or changing lesions.  LENTIGINES, SEBORRHEIC KERATOSES, HEMANGIOMAS - Benign normal skin lesions - Benign-appearing - Call for any changes  MELANOCYTIC NEVI - Tan-brown and/or pink-flesh-colored symmetric macules and papules - Benign appearing on exam today - Observation - Call  clinic for new or changing moles - Recommend daily use of broad spectrum spf 30+ sunscreen to sun-exposed areas.   ACNE VULGARIS Exam: Closed comedone on the L med prox thigh Treatment Plan: Benign, no tx needed.  Varicose Veins/Spider Veins - Dilated blue, purple or red veins at the lower extremities - Reassured - Smaller vessels can be treated by sclerotherapy (a procedure to inject a medicine into the veins to make them disappear) if desired, but the treatment is not covered by insurance. Larger vessels may be covered if symptomatic and we would refer to vascular surgeon if treatment desired.  DERMATOFIBROMA Exam: Firm pink/brown papulenodule with dimple sign L middle lat calf Treatment Plan: A dermatofibroma is a benign growth possibly related to trauma, such as an insect bite, cut from shaving, or inflamed acne-type bump.  Treatment options to remove include shave or excision with resulting scar and risk of recurrence.  Since benign-appearing and not bothersome, will observe for now.   Return in about 1 year (around 06/09/2024) for TBSE.  Arlinda Lais, CMA, am acting as scribe for Celine Collard, MD .   Documentation: I have reviewed the above documentation for accuracy and completeness, and I agree with the above.  Celine Collard, MD

## 2023-06-12 ENCOUNTER — Other Ambulatory Visit: Payer: Self-pay | Admitting: Family Medicine

## 2023-06-12 DIAGNOSIS — J0110 Acute frontal sinusitis, unspecified: Secondary | ICD-10-CM

## 2023-06-12 NOTE — Telephone Encounter (Signed)
 Requested medication (s) are due for refill today: Yes  Requested medication (s) are on the active medication list: Yes  Last refill:  04/22/23  Future visit scheduled: Yes  Notes to clinic:  Different prescriber.    Requested Prescriptions  Pending Prescriptions Disp Refills   fluticasone  (FLONASE ) 50 MCG/ACT nasal spray [Pharmacy Med Name: FLUTICASONE  PROP 50 MCG SPRAY] 16 mL 2    Sig: PLACE 2 SPRAYS IN BOTH NOSTRILS DAILY FOR 4-6 WEEKS THEN STOP AND USE SEASONALLY OR AS NEEDED.     Ear, Nose, and Throat: Nasal Preparations - Corticosteroids Failed - 06/12/2023  1:52 PM      Failed - Valid encounter within last 12 months    Recent Outpatient Visits   None     Future Appointments             In 7 months Karamalegos, Kayleen Party, DO [facility], [place]   In 1 year Elta Halter, MD [facility]

## 2023-06-23 ENCOUNTER — Encounter: Payer: Self-pay | Admitting: Student in an Organized Health Care Education/Training Program

## 2023-06-23 ENCOUNTER — Ambulatory Visit
Attending: Student in an Organized Health Care Education/Training Program | Admitting: Student in an Organized Health Care Education/Training Program

## 2023-06-23 ENCOUNTER — Other Ambulatory Visit: Payer: 59

## 2023-06-23 VITALS — BP 143/89 | HR 83 | Temp 97.3°F | Ht 73.0 in | Wt 195.0 lb

## 2023-06-23 DIAGNOSIS — G894 Chronic pain syndrome: Secondary | ICD-10-CM | POA: Diagnosis not present

## 2023-06-23 DIAGNOSIS — M5416 Radiculopathy, lumbar region: Secondary | ICD-10-CM | POA: Diagnosis not present

## 2023-06-23 DIAGNOSIS — N401 Enlarged prostate with lower urinary tract symptoms: Secondary | ICD-10-CM

## 2023-06-23 DIAGNOSIS — G8929 Other chronic pain: Secondary | ICD-10-CM | POA: Insufficient documentation

## 2023-06-23 DIAGNOSIS — E291 Testicular hypofunction: Secondary | ICD-10-CM

## 2023-06-23 DIAGNOSIS — N529 Male erectile dysfunction, unspecified: Secondary | ICD-10-CM

## 2023-06-23 NOTE — Progress Notes (Signed)
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 in foraminal stenosis. He prefers to avoid a spinal cord stimulator and seeks relief through an epidural steroid injection. Plan: Perform a lumbar epidural steroid injection at L3-L4, targeting the right side, with IV sedation using Versed . Evaluate the response to the injection before considering further interventions.  Degenerative disc disease   Degenerative disc disease with spinal narrowing and multiple bulging discs persists. He underwent lumbar surgery without fusion. Symptoms include radiating pain and flare-ups, especially after the recent knee surgery. He manages symptoms with exercise and stretching.  Knee replacement recovery   He is recovering post-operatively from recent knee replacement. He experiences tightness and soreness but no longer has bone-on-bone pain. He is engaged in physical therapy and home exercises, progressing well with a focus on maintaining mobility and strength.  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 Symptoms are managed through exercise and physical therapy. He continues to work out regularly to maintain joint function.  Carpal tunnel syndrome   Severe carpal tunnel syndrome with previous interventions was noted, but the current status was not discussed in detail during this encounter.   Continue medication management with established  primary care provider       Procedure Orders         Lumbar Epidural Injection    Provider-requested follow-up: Return in about 22 days (around 07/15/2023) for Right L3/4 ESI, in clinic IV Versed .  Future Appointments  Date Time Provider Department Center  07/08/2023 10:20 AM Gary Collin, MD [facility] None  01/01/2024  9:00 AM [facility]  01/08/2024  9:20 AM Gary Bunting, DO [facility]  06/15/2024  8:45 AM Elta Halter, MD [facility] None   I discussed the assessment and treatment plan with the patient. The patient was provided an opportunity to ask questions and all were answered. The patient agreed with the plan and demonstrated an understanding of the instructions.  Patient advised to call back or seek an in-person evaluation if the symptoms or condition worsens.  Duration of encounter: .  Total time on encounter, as per AMA guidelines included both the face-to-face and non-face-to-face time personally spent by the physician and/or other qualified health care professional(s) on the day of the encounter (includes time in activities that require the physician or other qualified health care professional and does not include time in activities normally performed by clinical staff). Physician's time may include the following activities when performed: Preparing to see the patient (e.g., pre-charting review of records, searching for previously ordered imaging, lab work, and nerve conduction tests) Review of prior analgesic pharmacotherapies. Reviewing PMP Interpreting ordered tests (e.g., lab work, imaging, nerve conduction tests) Performing post-procedure evaluations, including interpretation of diagnostic procedures Obtaining and/or reviewing separately obtained history Performing a medically appropriate examination and/or evaluation Counseling and educating the patient/family/caregiver Ordering medications, tests, or procedures Referring and  communicating with other health care professionals (when not separately reported) Documenting clinical information in the electronic or other health record Independently interpreting results (not separately reported) and communicating results to the patient/ family/caregiver Care coordination (not separately reported)  Note by: Gary Collin, MD (TTS and AI technology used. I apologize for any typographical errors that were not detected and corrected.) Date: 06/23/2023; Time: 9:42 AM

## 2023-06-23 NOTE — Progress Notes (Signed)
 Safety precautions to be maintained throughout the outpatient stay will include: orient to surroundings, keep bed in low position, maintain call bell within reach at all times, provide assistance with transfer out of bed and ambulation.

## 2023-06-23 NOTE — Patient Instructions (Signed)
   ______________________________________________________________________    Procedure instructions  Stop blood-thinners  Do not eat or drink fluids (other than water) for 6 hours before your procedure  No water for 2 hours before your procedure  Take your blood pressure medicine with a sip of water  Arrive 30 minutes before your appointment  If sedation is planned, bring suitable driver. Pennie Banter, Benedetto Goad, & public transportation are NOT APPROVED)  Carefully read the "Preparing for your procedure" detailed instructions  If you have questions call us at [phone]  Procedure appointments are for procedures only. NO medication refills or new problem evaluations.   ______________________________________________________________________

## 2023-06-24 ENCOUNTER — Ambulatory Visit: Payer: 59 | Admitting: Dermatology

## 2023-06-24 LAB — TESTOSTERONE: Testosterone: 632 ng/dL (ref 264–916)

## 2023-06-24 LAB — HEMOGLOBIN AND HEMATOCRIT, BLOOD
Hematocrit: 47.3 % (ref 37.5–51.0)
Hemoglobin: 15.9 g/dL (ref 13.0–17.7)

## 2023-06-24 LAB — PSA: Prostate Specific Ag, Serum: 1.2 ng/mL (ref 0.0–4.0)

## 2023-06-29 ENCOUNTER — Other Ambulatory Visit: Payer: Self-pay | Admitting: Urology

## 2023-06-29 DIAGNOSIS — E291 Testicular hypofunction: Secondary | ICD-10-CM

## 2023-06-29 MED ORDER — TESTOSTERONE CYPIONATE 200 MG/ML IM SOLN: 200.0000 mg | INTRAMUSCULAR | 0 refills | Status: DC

## 2023-07-01 ENCOUNTER — Encounter: Payer: Self-pay | Admitting: Family Medicine

## 2023-07-01 ENCOUNTER — Telehealth (INDEPENDENT_AMBULATORY_CARE_PROVIDER_SITE_OTHER): Admitting: Family Medicine

## 2023-07-01 DIAGNOSIS — M4802 Spinal stenosis, cervical region: Secondary | ICD-10-CM | POA: Diagnosis not present

## 2023-07-01 DIAGNOSIS — M5030 Other cervical disc degeneration, unspecified cervical region: Secondary | ICD-10-CM

## 2023-07-01 DIAGNOSIS — M5136 Other intervertebral disc degeneration, lumbar region with discogenic back pain only: Secondary | ICD-10-CM | POA: Diagnosis not present

## 2023-07-01 DIAGNOSIS — M5441 Lumbago with sciatica, right side: Secondary | ICD-10-CM | POA: Diagnosis not present

## 2023-07-01 DIAGNOSIS — G8929 Other chronic pain: Secondary | ICD-10-CM | POA: Diagnosis not present

## 2023-07-01 DIAGNOSIS — M51369 Other intervertebral disc degeneration, lumbar region without mention of lumbar back pain or lower extremity pain: Secondary | ICD-10-CM

## 2023-07-01 NOTE — Patient Instructions (Addendum)
 We will fax records to your Pain Management specialist as requested  Please schedule a Follow-up Appointment to: No follow-ups on file.  If you have any other questions or concerns, please feel free to call the office or send a message through MyChart. You may also schedule an earlier appointment if necessary.  Additionally, you may be receiving a survey about your experience at our office within a few days to 1 week by e-mail or mail. We value your feedback.  Domingo Friend, DO [facility], [place]
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
## 2023-07-08 ENCOUNTER — Encounter: Payer: Self-pay | Admitting: Student in an Organized Health Care Education/Training Program

## 2023-07-08 ENCOUNTER — Ambulatory Visit
Admission: RE | Admit: 2023-07-08 | Discharge: 2023-07-08 | Disposition: A | Discharge status: Home / Self Care | Source: Ambulatory Visit | Attending: Student in an Organized Health Care Education/Training Program | Admitting: Student in an Organized Health Care Education/Training Program

## 2023-07-08 ENCOUNTER — Ambulatory Visit
Attending: Student in an Organized Health Care Education/Training Program | Admitting: Student in an Organized Health Care Education/Training Program

## 2023-07-08 DIAGNOSIS — M5416 Radiculopathy, lumbar region: Secondary | ICD-10-CM | POA: Insufficient documentation

## 2023-07-08 DIAGNOSIS — G894 Chronic pain syndrome: Secondary | ICD-10-CM | POA: Insufficient documentation

## 2023-07-08 DIAGNOSIS — G8929 Other chronic pain: Secondary | ICD-10-CM | POA: Diagnosis not present

## 2023-07-08 MED ORDER — ROPIVACAINE HCL 2 MG/ML IJ SOLN
INTRAMUSCULAR | Status: AC
  Filled 2023-07-08: qty 20

## 2023-07-08 MED ORDER — LIDOCAINE HCL 2 % IJ SOLN
20.0000 mL | Freq: Once | INTRAMUSCULAR | Status: AC
  Administered 2023-07-08: 100 mg

## 2023-07-08 MED ORDER — DEXAMETHASONE SODIUM PHOSPHATE 10 MG/ML IJ SOLN
10.0000 mg | Freq: Once | INTRAMUSCULAR | Status: AC
  Administered 2023-07-08: 10 mg

## 2023-07-08 MED ORDER — SODIUM CHLORIDE 0.9% FLUSH
2.0000 mL | Freq: Once | INTRAVENOUS | Status: AC
  Administered 2023-07-08: 2 mL

## 2023-07-08 MED ORDER — SODIUM CHLORIDE (PF) 0.9 % IJ SOLN
INTRAMUSCULAR | Status: AC
  Filled 2023-07-08: qty 10

## 2023-07-08 MED ORDER — IOHEXOL 180 MG/ML  SOLN
INTRAMUSCULAR | Status: AC
  Filled 2023-07-08: qty 20

## 2023-07-08 MED ORDER — MIDAZOLAM HCL 2 MG/2ML IJ SOLN
INTRAMUSCULAR | Status: AC
  Filled 2023-07-08: qty 2

## 2023-07-08 MED ORDER — ROPIVACAINE HCL 2 MG/ML IJ SOLN
2.0000 mL | Freq: Once | INTRAMUSCULAR | Status: AC
  Administered 2023-07-08: 2 mL via EPIDURAL

## 2023-07-08 MED ORDER — MIDAZOLAM HCL 2 MG/2ML IJ SOLN
0.5000 mg | Freq: Once | INTRAMUSCULAR | Status: AC
  Administered 2023-07-08: 2 mg via INTRAVENOUS

## 2023-07-08 MED ORDER — IOHEXOL 180 MG/ML  SOLN
10.0000 mL | Freq: Once | INTRAMUSCULAR | Status: AC
  Administered 2023-07-08: 10 mL via INTRA_ARTICULAR

## 2023-07-08 MED ORDER — DEXAMETHASONE SODIUM PHOSPHATE 10 MG/ML IJ SOLN
INTRAMUSCULAR | Status: AC
  Filled 2023-07-08: qty 1

## 2023-07-08 MED ORDER — LIDOCAINE HCL (PF) 2 % IJ SOLN
INTRAMUSCULAR | Status: AC
  Filled 2023-07-08: qty 10

## 2023-07-08 MED ORDER — LACTATED RINGERS IV SOLN: Freq: Once | INTRAVENOUS | Status: AC

## 2023-07-08 NOTE — Progress Notes (Signed)
 PROVIDER NOTE: Interpretation of information contained herein should be left to medically-trained personnel. Specific patient instructions are provided elsewhere under "Patient Instructions" section of medical record. This document was created in part using STT-dictation technology, any transcriptional errors that may result from this process are unintentional.  Patient: Gary Schultz Type: Established DOB: 1965/10/31 MRN: [id number] PCP: Raina Bunting, DO  Service: Procedure DOS: 07/08/2023 Setting: Ambulatory Location: Ambulatory outpatient facility Delivery: Face-to-face Provider: Cephus Collin, MD Specialty: Interventional Pain Management Specialty designation: 09 Location: Outpatient facility Ref. Prov.: Cephus Collin, MD       Interventional Therapy   Type: Lumbar epidural steroid injection (LESI) (interlaminar) #1    Laterality: Right   Level:  L3-4 Level.  Imaging: Fluoroscopic guidance         Anesthesia: Local anesthesia (1-2% Lidocaine ) Sedation: Minimal Sedation                       DOS: 07/08/2023  Performed by: Cephus Collin, MD  Purpose: Diagnostic/Therapeutic Indications: Lumbar radicular pain of intraspinal etiology of more than 4 weeks that has failed to respond to conservative therapy and is severe enough to impact quality of life or function. 1. Lumbar radiculopathy   2. Chronic radicular lumbar pain   3. Chronic pain syndrome    NAS-11 Pain score:   Pre-procedure: 8 /10   Post-procedure: 5  (tightness)/10      Position / Prep / Materials:  Position: Prone w/ head of the table raised (slight reverse trendelenburg) to facilitate breathing.  Prep solution: ChloraPrep (2% chlorhexidine  gluconate and 70% isopropyl alcohol) Prep Area: Entire Posterior Lumbar Region from lower scapular tip down to mid buttocks area and from flank to flank. Materials:  Tray: Epidural tray Needle(s):  Type: Epidural needle (Tuohy) Gauge (G):  22 Length: Regular  (3.5-in) Qty: 1  H&P (Pre-op Assessment):  Gary Schultz is a 58 y.o. (year old), male patient, seen today for interventional treatment. 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 Gary Schultz has a current medication list which includes the following prescription(s): diclofenac  sodium, gabapentin, lidocaine , lisinopril , omeprazole , oxycodone  hcl, testosterone  cypionate, apixaban , ascorbic acid, b-d 3cc luer-lok syr 21gx1-1/2, chlorhexidine , chlorpheniramine-dm, fluticasone , bd disp needles, bd safetyglide needle, bd disp needles, oxycodone  hcl, chloraseptic max sore throat, prednisone , and 2-3cc syringe, and the following Facility-Administered Medications: lactated ringers . His primarily concern today is the Back Pain (lower)  Initial Vital Signs:  Pulse/HCG Rate: 75ECG Heart Rate: 86 Temp: 98 F (36.7 C) Resp: 16 BP: (!) 144/92 SpO2: 98 %  BMI: Estimated body mass index is 25.73 kg/m as calculated from the following:   Height as of this encounter: 6\' 1"  (1.854 m).   Weight as of this encounter: 195 lb (88.5 kg).  Risk Assessment: Allergies: Reviewed. He is allergic to tylenol  [acetaminophen ].  Allergy Precautions: None required Coagulopathies: Reviewed. None identified.  Blood-thinner therapy: None at this time Active Infection(s): Reviewed. None identified. Gary Schultz is afebrile  Site Confirmation: Gary Schultz was asked to confirm the procedure and laterality before marking the site Procedure checklist: Completed Consent: Before the  procedure and under the influence of no sedative(s), amnesic(s), or  anxiolytics, the patient was informed of the treatment options, risks and possible complications. To fulfill our ethical and legal obligations, as recommended by the American Medical Association's Code of Ethics, I have informed the patient of my clinical impression; the nature and purpose of the treatment or procedure; the risks, benefits, and possible complications of the intervention; the alternatives, including doing nothing; the risk(s) and benefit(s) of the alternative treatment(s) or procedure(s); and the risk(s) and benefit(s) of doing nothing. The patient was provided information about the general risks and possible complications associated with the procedure. These may include, but are not limited to: failure to achieve desired goals, infection, bleeding, organ or nerve damage, allergic reactions, paralysis, and death. In addition, the patient was informed of those risks and complications associated to Spine-related procedures, such as failure to decrease pain; infection (i.e.: Meningitis, epidural or intraspinal abscess); bleeding (i.e.: epidural hematoma, subarachnoid hemorrhage, or any other type of intraspinal or peri-dural bleeding); organ or nerve damage (i.e.: Any type of peripheral nerve, nerve root, or spinal cord injury) with subsequent damage to sensory, motor, and/or autonomic systems, resulting in permanent pain, numbness, and/or weakness of one or several areas of the body; allergic reactions; (i.e.: anaphylactic reaction); and/or death. Furthermore, the patient was informed of those risks and complications associated with the medications. These include, but are not limited to: allergic reactions (i.e.: anaphylactic or anaphylactoid reaction(s)); adrenal axis suppression; blood sugar elevation that in diabetics may result in ketoacidosis or comma; water  retention that in patients with history of congestive heart failure  may result in shortness of breath, pulmonary edema, and decompensation with resultant heart failure; weight gain; swelling or edema; medication-induced neural toxicity; particulate matter embolism and blood vessel occlusion with resultant organ, and/or nervous system infarction; and/or aseptic necrosis of one or more joints. Finally, the patient was informed that Medicine is not an exact science; therefore, there is also the possibility of unforeseen or unpredictable risks and/or possible complications that may result in a catastrophic outcome. The patient indicated having understood very clearly. We have given the patient no guarantees and we have made no promises. Enough time was given to the patient to ask questions, all of which were answered to the patient's satisfaction. Mr. Brandl has indicated that he wanted to continue with the procedure. Attestation: I, the ordering provider, attest that I have discussed with the patient the benefits, risks, side-effects, alternatives, likelihood of achieving goals, and potential problems during recovery for the procedure that I have provided informed consent. Date  Time: 07/08/2023  8:50 AM  Pre-Procedure Preparation:  Monitoring: As per clinic protocol. Respiration, ETCO2, SpO2, BP, heart rate and rhythm monitor placed and checked for adequate function Safety Precautions: Patient was assessed for positional comfort and pressure points before starting the procedure. Time-out: I initiated and conducted the "Time-out" before starting the procedure, as per protocol. The patient was asked to participate by confirming the accuracy of the "Time Out" information. Verification of the correct person, site, and procedure were performed and confirmed by me, the nursing staff, and the patient. "Time-out" conducted as per Joint Commission's Universal Protocol (UP.01.01.01). Time: 0939 Start Time: 0939 hrs.  Description/Narrative of Procedure:          Target: Epidural  space via interlaminar opening, initially targeting the lower laminar border of the superior vertebral body. Region: Lumbar Approach: Percutaneous paravertebral  Rationale (medical necessity): procedure needed and proper for the diagnosis and/or treatment of the patient's medical symptoms and needs. Procedural Technique Safety Precautions: Aspiration  looking for blood return was conducted prior to all injections. At no point did we inject any substances, as a needle was being advanced. No attempts were made at seeking any paresthesias. Safe injection practices and needle disposal techniques used. Medications properly checked for expiration dates. SDV (single dose vial) medications used. Description of the Procedure: Protocol guidelines were followed. The procedure needle was introduced through the skin, ipsilateral to the reported pain, and advanced to the target area. Bone was contacted and the needle walked caudad, until the lamina was cleared. The epidural space was identified using "loss-of-resistance technique" with 2-3 ml of PF-NaCl (0.9% NSS), in a 5cc LOR glass syringe.  Vitals:   07/08/23 0933 07/08/23 0937 07/08/23 0943 07/08/23 0950  BP: (!) 135/115 (!) 148/102 (!) 140/96 (!) 145/104  Pulse:      Resp: 18 17 15 16   Temp:    98.5 F (36.9 C)  TempSrc:    Temporal  SpO2: 99% 96% 100% 100%  Weight:      Height:        Start Time: 0939 hrs. End Time: 0943 hrs.  Imaging Guidance (Spinal):          Type of Imaging Technique: Fluoroscopy Guidance (Spinal) Indication(s): Fluoroscopy guidance for needle placement to enhance accuracy in procedures requiring precise needle localization for targeted delivery of medication in or near specific anatomical locations not easily accessible without such real-time imaging assistance. Exposure Time: Please see nurses notes. Contrast: Before injecting any contrast, we confirmed that the patient did not have an allergy to iodine, shellfish, or  radiological contrast. Once satisfactory needle placement was completed at the desired level, radiological contrast was injected. Contrast injected under live fluoroscopy. No contrast complications. See chart for type and volume of contrast used. Fluoroscopic Guidance: I was personally present during the use of fluoroscopy. "Tunnel Vision Technique" used to obtain the best possible view of the target area. Parallax error corrected before commencing the procedure. "Direction-depth-direction" technique used to introduce the needle under continuous pulsed fluoroscopy. Once target was reached, antero-posterior, oblique, and lateral fluoroscopic projection used confirm needle placement in all planes. Images permanently stored in EMR. Interpretation: I personally interpreted the imaging intraoperatively. Adequate needle placement confirmed in multiple planes. Appropriate spread of contrast into desired area was observed. No evidence of afferent or efferent intravascular uptake. No intrathecal or subarachnoid spread observed. Permanent images saved into the patient's record.  Antibiotic Prophylaxis:   Anti-infectives (From admission, onward)    None      Indication(s): None identified   Post-operative Assessment:  Post-procedure Vital Signs:  Pulse/HCG Rate: 7578 Temp: 98.5 F (36.9 C) Resp: 16 BP: (!) 145/104 SpO2: 100 %  EBL: None  Complications: No immediate post-treatment complications observed by team, or reported by patient.  Note: The patient tolerated the entire procedure well. A repeat set of vitals were taken after the procedure and the patient was kept under observation following institutional policy, for this type of procedure. Post-procedural neurological assessment was performed, showing return to baseline, prior to discharge. The patient was provided with post-procedure discharge instructions, including a section on how to identify potential problems. Should any problems arise  concerning this procedure, the patient was given instructions to immediately contact us , at any time, without hesitation. In any case, we plan to contact the patient by telephone for a follow-up status report regarding this interventional procedure.  Comments:  No additional relevant information.  Plan of Care (POC)  Orders:  Orders Placed This Encounter  Procedures   DG PAIN CLINIC C-ARM 1-60 MIN NO REPORT    Intraoperative interpretation by procedural physician at [facility].    Standing Status:   Standing    Number of Occurrences:   1    Reason for exam::   Assistance in needle guidance and placement for procedures requiring needle placement in or near specific anatomical locations not easily accessible without such assistance.     Medications ordered for procedure: Meds ordered this encounter  Medications   lidocaine  (XYLOCAINE ) 2 % (with pres) injection 400 mg   iohexol (OMNIPAQUE) 180 MG/ML injection 10 mL    Must be Myelogram-compatible. If not available, you may substitute with a water -soluble, non-ionic, hypoallergenic, myelogram-compatible radiological contrast medium.   lactated ringers  infusion   midazolam  (VERSED ) injection 0.5-2 mg    Make sure Flumazenil is available in the pyxis when using this medication. If oversedation occurs, administer 0.2 mg IV over 15 sec. If after 45 sec no response, administer 0.2 mg again over 1 min; may repeat at 1 min intervals; not to exceed 4 doses (1 mg)   ropivacaine (PF) 2 mg/mL (0.2%) (NAROPIN) injection 2 mL   sodium chloride  flush (NS) 0.9 % injection 2 mL   dexamethasone  (DECADRON ) injection 10 mg   Medications administered: We administered lidocaine , iohexol, lactated ringers , midazolam , ropivacaine (PF) 2 mg/mL (0.2%), sodium chloride  flush, and dexamethasone .  See the medical record for exact dosing, route, and time of administration.  Follow-up plan:   Return in about 5 weeks (around 08/12/2023) for PPE, F2F.        Right L3-4 ESI 07/08/22    Recent Visits Date Type Provider Dept  06/23/23 Office Visit Cephus Collin, MD [facility]  Showing recent visits within past 90 days and meeting all other requirements Today's Visits Date Type Provider Dept  07/08/23 Procedure visit Cephus Collin, MD [facility]  Showing today's visits and meeting all other requirements Future Appointments Date Type Provider Dept  08/13/23 Appointment Cephus Collin, MD [facility]  Showing future appointments within next 90 days and meeting all other requirements  Disposition: Discharge home  Discharge (Date  Time): 07/08/2023; 0958 hrs.   Primary Care Physician: Raina Bunting, DO Location: [facility] Note by: Cephus Collin, MD (TTS technology used. I apologize for any typographical errors that were not detected and corrected.) Date: 07/08/2023; Time: 10:23 AM  Disclaimer:  Medicine is not an Visual merchandiser. The only guarantee in medicine is that nothing is guaranteed. It is important to note that the decision to proceed with this intervention was based on the information collected from the patient. The Data and conclusions were drawn from the patient's questionnaire, the interview, and the physical examination. Because the information was provided in large part by the patient, it cannot be guaranteed that it has not been purposely or unconsciously manipulated. Every effort has been made to obtain as much relevant data as possible for this evaluation. It is important to note that the conclusions that lead to this procedure are derived in large part from the available data. Always take into account that the treatment will also be dependent on availability of resources and existing treatment guidelines, considered by other Pain Management Practitioners as being common knowledge and practice, at the time of the intervention. For Medico-Legal purposes, it is  also important to point out that variation in procedural techniques and pharmacological choices are the acceptable norm. The indications, contraindications, technique, and results  of the above procedure should only be interpreted and judged by a Board-Certified Interventional Pain Specialist with extensive familiarity and expertise in the same exact procedure and technique.

## 2023-07-08 NOTE — Progress Notes (Signed)
 Safety precautions to be maintained throughout the outpatient stay will include: orient to surroundings, keep bed in low position, maintain call bell within reach at all times, provide assistance with transfer out of bed and ambulation.

## 2023-07-09 ENCOUNTER — Telehealth: Payer: Self-pay

## 2023-07-09 NOTE — Telephone Encounter (Signed)
 Post procedure follow up.  Patient states he is doing fine

## 2023-07-27 ENCOUNTER — Ambulatory Visit: Admitting: Dermatology

## 2023-08-10 ENCOUNTER — Other Ambulatory Visit: Payer: Self-pay | Admitting: Urology

## 2023-08-10 ENCOUNTER — Telehealth: Payer: Self-pay | Admitting: Urology

## 2023-08-10 DIAGNOSIS — E291 Testicular hypofunction: Secondary | ICD-10-CM

## 2023-08-10 MED ORDER — TESTOSTERONE CYPIONATE 200 MG/ML IM SOLN
200.0000 mg | INTRAMUSCULAR | 0 refills | Status: DC
Start: 2023-08-10 — End: 2023-09-08

## 2023-08-10 NOTE — Telephone Encounter (Signed)
 The patient called to request a refill of their testosterone  prescription.

## 2023-08-13 ENCOUNTER — Ambulatory Visit: Admitting: Student in an Organized Health Care Education/Training Program

## 2023-09-05 ENCOUNTER — Other Ambulatory Visit: Payer: Self-pay | Admitting: Family Medicine

## 2023-09-05 DIAGNOSIS — J0110 Acute frontal sinusitis, unspecified: Secondary | ICD-10-CM

## 2023-09-08 ENCOUNTER — Other Ambulatory Visit: Payer: Self-pay | Admitting: Urology

## 2023-09-08 DIAGNOSIS — E291 Testicular hypofunction: Secondary | ICD-10-CM

## 2023-09-08 NOTE — Telephone Encounter (Signed)
 Requested Prescriptions  Pending Prescriptions Disp Refills   fluticasone  (FLONASE ) 50 MCG/ACT nasal spray [Pharmacy Med Name: FLUTICASONE  PROP 50 MCG SPRAY] 48 mL 1    Sig: PLACE 2 SPRAYS IN BOTH NOSTRILS DAILY FOR 4-6 WEEKS THEN STOP AND USE SEASONALLY OR AS NEEDED.     Ear, Nose, and Throat: Nasal Preparations - Corticosteroids Passed - 09/08/2023 10:07 AM      Passed - Valid encounter within last 12 months    Recent Outpatient Visits           2 months ago DDD (degenerative disc disease), cervical   [facility] [place], Marsa PARAS, DO       Future Appointments             In 4 months Edman, Marsa PARAS, DO [place] [facility], [place]   In 9 months Hester Alm BROCKS, MD [facility]

## 2023-09-29 ENCOUNTER — Other Ambulatory Visit: Payer: Self-pay | Admitting: *Deleted

## 2023-09-29 DIAGNOSIS — N138 Other obstructive and reflux uropathy: Secondary | ICD-10-CM

## 2023-09-29 DIAGNOSIS — E291 Testicular hypofunction: Secondary | ICD-10-CM

## 2023-09-30 ENCOUNTER — Other Ambulatory Visit

## 2023-09-30 DIAGNOSIS — N138 Other obstructive and reflux uropathy: Secondary | ICD-10-CM

## 2023-09-30 DIAGNOSIS — E291 Testicular hypofunction: Secondary | ICD-10-CM

## 2023-10-01 ENCOUNTER — Ambulatory Visit: Admitting: Student in an Organized Health Care Education/Training Program

## 2023-10-01 LAB — HEMOGLOBIN AND HEMATOCRIT, BLOOD
Hematocrit: 50.3 % (ref 37.5–51.0)
Hemoglobin: 15.7 g/dL (ref 13.0–17.7)

## 2023-10-01 LAB — PSA: Prostate Specific Ag, Serum: 0.5 ng/mL (ref 0.0–4.0)

## 2023-10-01 LAB — TESTOSTERONE: Testosterone: 134 ng/dL — ABNORMAL LOW (ref 264–916)

## 2023-10-08 ENCOUNTER — Other Ambulatory Visit: Payer: Self-pay | Admitting: Urology

## 2023-10-08 DIAGNOSIS — E291 Testicular hypofunction: Secondary | ICD-10-CM

## 2023-11-18 ENCOUNTER — Other Ambulatory Visit: Payer: Self-pay | Admitting: Urology

## 2023-11-18 DIAGNOSIS — E291 Testicular hypofunction: Secondary | ICD-10-CM

## 2023-11-24 ENCOUNTER — Ambulatory Visit: Payer: Self-pay

## 2023-11-24 NOTE — Telephone Encounter (Signed)
 FYI Only or Action Required?: FYI only for provider.  Patient was last seen in primary care on 07/01/2023 by Gary Marsa PARAS, DO.  Called Nurse Triage reporting Knee Pain.  Symptoms began about a month ago.  Symptoms are: gradually worsening.  Triage Disposition: See PCP When Office is Open (Within 3 Days)  Patient/caregiver understands and will follow disposition?: Yes      Copied from CRM #[id number]. Topic: Clinical - Red Word Triage >> Nov 24, 2023  3:25 PM [name] wrote: Red Word that prompted transfer to Nurse Triage: Left knee causing pain he cant handle. Taking pain medication. Wanting a MRI.       Reason for Disposition  [1] MODERATE pain (e.g., interferes with normal activities, limping) AND [2] present > 3 days  Answer Assessment - Initial Assessment Questions 1. LOCATION and RADIATION: Where is the pain located?      Left knee radiates down to left ankle  2. QUALITY: What does the pain feel like?  (e.g., sharp, dull, aching, burning)     Sharp 3. SEVERITY: How bad is the pain? What does it keep you from doing?   (Scale 1-10; or mild, moderate, severe)     Moderate 4. ONSET: When did the pain start? Does it come and go, or is it there all the time?     About a month  5. RECURRENT: Have you had this pain before? If Yes, ask: When, and what happened then?     Yes, history of knee pain  6. SETTING: Has there been any recent work, exercise or other activity that involved that part of the body?      No 7. AGGRAVATING FACTORS: What makes the knee pain worse? (e.g., walking, climbing stairs, running)     Walking  8. ASSOCIATED SYMPTOMS: Is there any swelling or redness of the knee?     No 9. OTHER SYMPTOMS: Do you have any other symptoms? (e.g., calf pain, chest pain, difficulty breathing, fever)     No  Protocols used: Knee Pain-A-AH

## 2023-11-25 NOTE — Telephone Encounter (Signed)
 Will discuss at upcoming appointment

## 2023-11-26 ENCOUNTER — Ambulatory Visit (INDEPENDENT_AMBULATORY_CARE_PROVIDER_SITE_OTHER): Admitting: Internal Medicine

## 2023-11-26 ENCOUNTER — Encounter: Payer: Self-pay | Admitting: Internal Medicine

## 2023-11-26 VITALS — BP 150/80 | Ht 73.0 in | Wt 200.0 lb

## 2023-11-26 DIAGNOSIS — M25562 Pain in left knee: Secondary | ICD-10-CM | POA: Diagnosis not present

## 2023-11-26 DIAGNOSIS — G8929 Other chronic pain: Secondary | ICD-10-CM

## 2023-11-26 DIAGNOSIS — Z87828 Personal history of other (healed) physical injury and trauma: Secondary | ICD-10-CM | POA: Diagnosis not present

## 2023-11-26 DIAGNOSIS — J3089 Other allergic rhinitis: Secondary | ICD-10-CM

## 2023-11-26 MED ORDER — PREDNISONE 20 MG PO TABS
20.0000 mg | ORAL_TABLET | Freq: Every day | ORAL | 0 refills | Status: DC
Start: 2023-11-26 — End: 2023-12-30

## 2023-11-26 NOTE — Progress Notes (Signed)
 Subjective:    Patient ID: Gary Schultz, male    DOB: 1965-09-12, 58 y.o.   MRN: [id number]  HPI  Discussed the use of AI scribe software for clinical note transcription with the patient, who gave verbal consent to proceed.  Gary Schultz is a 58 year old male who presents with worsening left knee pain.  He has been experiencing left knee pain for the past four to five years, which has worsened over the last month.  He has a history of osteoarthritis and chronic pain syndrome.  He underwent arthroscopic surgery five years ago for left meniscal repair, which is currently doing well. The left knee pain is described as sore and achy, with occasional sharp pain radiating to the back of the knee and down to the left ankle, where he had reconstructive surgery. The pain is exacerbated by prolonged standing and walking up or down stairs. No recent injuries or swelling in the knee. He is currently taking 20 mg oxycodone  instant release as needed for pain management and uses lidocaine  patches at night due to throbbing pain that affects his sleep. He is followed by pain management monthly and is also under the care of orthopedics at the [facility], although he has not been able to see them recently due to their busy schedules.  He also reports sinus issues that have been ongoing for a few days, with mild headaches, nasal drip, and a recent earache.  He denies runny nose, nasal congestion, sore throat, cough or shortness of breath.  Denies fever, chills or bodyaches.  He has been using a steroid nasal spray, which he started recently, but uses it sparingly due to side effects like headaches and epistaxis. He attributes these symptoms to seasonal allergies, which he experiences annually.       Review of Systems   Past Medical History:  Diagnosis Date   Anxiety    Carpal tunnel syndrome    bilateral   DDD (degenerative disc disease), lumbar    Generalized headaches    from neck  injury   GERD (gastroesophageal reflux disease)    Hypertension    Neck pain    Neuromuscular disorder (HCC)    Varicose veins     Current Outpatient Medications  Medication Sig Dispense Refill   apixaban  (ELIQUIS ) 2.5 MG TABS tablet Take 1 tablet (2.5 mg total) by mouth 2 (two) times daily. (Patient not taking: Reported on 06/23/2023) 30 tablet 0   Ascorbic Acid (VITAMIN C PO) Take 1 tablet by mouth 3 (three) times a week. (Patient not taking: Reported on 06/23/2023)     B-D 3CC LUER-LOK SYR 21GX1-1/2 21G X 1-1/2 3 ML MISC USE THIS NEEDLE TO INJECTION (Patient not taking: Reported on 06/23/2023) 50 each 0   chlorhexidine  (HIBICLENS ) 4 % external liquid Apply 15 mLs (1 Application total) topically as directed for 30 doses. Use as directed daily for 5 days every other week for 6 weeks. (Patient not taking: Reported on 06/23/2023) 946 mL 1   Chlorpheniramine-DM (CORICIDIN HBP COUGH/COLD PO) Take by mouth. (Patient not taking: Reported on 06/23/2023)     diclofenac  Sodium (VOLTAREN ) 1 % GEL Apply 2 g topically 4 (four) times daily.     fluticasone  (FLONASE ) 50 MCG/ACT nasal spray PLACE 2 SPRAYS IN BOTH NOSTRILS DAILY FOR 4-6 WEEKS THEN STOP AND USE SEASONALLY OR AS NEEDED. 48 mL 1   gabapentin (NEURONTIN) 600 MG tablet Take 600 mg by mouth 3 (three) times daily as needed (  pain).     lidocaine  (LIDODERM ) 5 % Place 1-2 patches onto the skin daily as needed (back pain).     lisinopril  (ZESTRIL ) 30 MG tablet Take 1 tablet (30 mg total) by mouth daily. 30 tablet 11   NEEDLE, DISP, 18 G (BD DISP NEEDLES) 18G X 1-1/2 MISC 1 mg by Does not apply route every 14 (fourteen) days. (Patient not taking: Reported on 06/23/2023) 50 each 0   NEEDLE, DISP, 18 G (BD SAFETYGLIDE NEEDLE) 18G X 1-1/2 MISC This needle is to pull up medication (Patient not taking: Reported on 06/23/2023) 50 each 0   NEEDLE, DISP, 21 G (BD DISP NEEDLES) 21G X 1-1/2 MISC 1 mg by Does not apply route every 14 (fourteen) days. (Patient not taking:  Reported on 06/23/2023) 50 each 0   omeprazole  (PRILOSEC) 20 MG capsule Take 1 capsule (20 mg total) by mouth daily before breakfast. Extra refills on file 30 capsule 11   Oxycodone  HCl 10 MG TABS Take 1 tablet (10 mg total) by mouth 4 (four) times daily as needed (Breakthrough pain). 40 tablet 0   Oxycodone  HCl 20 MG TABS Take 20 mg by mouth every 4 (four) hours as needed (pain).     Phenol-Glycerin (CHLORASEPTIC MAX SORE THROAT) 1.5-33 % LIQD Use as directed in the mouth or throat. (Patient not taking: Reported on 06/23/2023)     predniSONE  (DELTASONE ) 10 MG tablet Take 10 mg by mouth daily with breakfast. (Patient not taking: Reported on 06/23/2023)     Syringe, Disposable, (2-3CC SYRINGE) 3 ML MISC 1 mg by Does not apply route every 14 (fourteen) days. (Patient not taking: Reported on 06/23/2023) 25 each 3   testosterone  cypionate (DEPOTESTOSTERONE CYPIONATE) 200 MG/ML injection INJECT 1 ML INTO THE MUSCLE EVERY 7 DAYS 4 mL 0   No current facility-administered medications for this visit.    Allergies  Allergen Reactions   Tylenol  [Acetaminophen ] Hives, Itching, Rash and Other (See Comments)    Stomach pain    Family History  Problem Relation Age of Onset   Cancer Mother    Varicose Veins Mother    Cancer Father    Stroke Father    Hypertension Father    Colon cancer Father     Social History   Socioeconomic History   Marital status: Married    Spouse name: Gary Schultz   Number of children: Not on file   Years of education: Not on file   Highest education level: 10th grade  Occupational History   Not on file  Tobacco Use   Smoking status: Never    Passive exposure: Never   Smokeless tobacco: Never  Vaping Use   Vaping status: Never Used  Substance and Sexual Activity   Alcohol use: Not Currently    Comment: occassional   Drug use: Yes    Comment: prescribed oxy 20mg  and muscle relaxers   Sexual activity: Yes  Other Topics Concern   Not on file  Social History Narrative   Not  on file   Social Drivers of Health   Financial Resource Strain: Low Risk  (05/11/2023)   Received from [facility]   Overall Financial Resource Strain (CARDIA)    Difficulty of Paying Living Expenses: Not very hard  Recent Concern: Financial Resource Strain - Medium Risk (05/06/2023)   Received from [facility]   Overall Financial Resource Strain (CARDIA)    Difficulty of Paying Living Expenses: Somewhat hard  Food Insecurity: No Food Insecurity (05/11/2023)  Received from [facility]   Hunger Vital Sign    Within the past 12 months, you worried that your food would run out before you got the money to buy more.: Never true    Within the past 12 months, the food you bought just didn't last and you didn't have money to get more.: Never true  Transportation Needs: No Transportation Needs (05/11/2023)   Received from [facility] - Transportation    In the past 12 months, has lack of transportation kept you from medical appointments or from getting medications?: No    Lack of Transportation (Non-Medical): No  Physical Activity: Unknown (01/07/2023)   Exercise Vital Sign    Days of Exercise per Week: 0 days    Minutes of Exercise per Session: Not on file  Stress: No Stress Concern Present (01/07/2023)   [employer] of Occupational Health - Occupational Stress Questionnaire    Feeling of Stress : Only a little  Social Connections: Moderately Integrated (01/07/2023)   Social Connection and Isolation Panel    Frequency of Communication with Friends and Family: Three times a week    Frequency of Social Gatherings with Friends and Family: Once a week    Attends Religious Services: 1 to 4 times per year    Active Member of [employer] or Organizations: No    Attends Engineer, structural: Not on file    Marital Status: Married  Catering manager Violence: Not At Risk (08/14/2022)   Received from [facility]   HITS    Over the last 12 months how often did your partner physically hurt you?: Never    Over the last 12 months how often did your partner insult you or talk down to you?: Never    Over the last 12 months how often did your partner threaten you with physical harm?: Never    Over the last 12 months how often did your partner scream or curse at you?: Never     Constitutional: Patient reports headache.  Denies fever, malaise, fatigue, or abrupt weight changes.  HEENT: Patient reports sinus discomfort, ear pain, runny nose and postnasal drip.  Denies eye pain, eye redness,  ringing in the ears, wax buildup, nasal congestion, bloody nose, or sore throat. Respiratory: Denies difficulty breathing, shortness of breath, cough or sputum production.   Cardiovascular: Denies chest pain, chest tightness, palpitations or swelling in the hands or feet.  Musculoskeletal: Patient reports knee pain.  Denies decrease in range of motion, difficulty with gait, muscle pain or joint swelling.  Neurological: Denies dizziness, difficulty with memory, difficulty with speech or problems with balance and coordination.    No other specific complaints in a complete review of systems (except as listed in HPI above).      Objective:   Physical Exam BP (!) 150/80   Ht 6' 1 (1.854 m)   Wt 200 lb (90.7 kg)   BMI 26.39 kg/m   Wt Readings from Last 3 Encounters:  07/08/23 195 lb (88.5 kg)  06/23/23 195 lb (88.5 kg)  05/04/23 180 lb (81.6 kg)    General: Appears his stated age, overweight, in NAD. Skin: Warm, dry and intact. No redness or warmth noted of the left knee. HEENT: Head: normal shape and size, no sinus pressure noted; Eyes: sclera white, no icterus, conjunctiva pink, PERRLA and EOMs intact; Ears: Tm's gray and intact, normal light reflex; Nose: mucosa pink and moist, septum midline, turbinates swollen on  the left; Throat/Mouth: Teeth present, mucosa pink and moist, no exudate, lesions or  ulcerations noted.  Neck: No adenopathy noted. Cardiovascular: Normal rate and rhythm. S1,S2 noted.  No murmur, rubs or gallops noted. No JVD or BLE edema. No carotid bruits noted. Pulmonary/Chest: Normal effort and positive vesicular breath sounds. No respiratory distress. No wheezes, rales or ronchi noted.  Musculoskeletal: Normal flexion and extension of left knee.  Joint enlargement without joint effusion.  Pain with palpation over the patella and the posterior knee.  Negative Lachman.  Negative McMurray.  No difficulty with gait.  Neurological: Alert and oriented.  Coordination normal.    BMET    Component Value Date/Time   NA 135 05/04/2023 1023   K 4.5 05/04/2023 1023   CL 103 05/04/2023 1023   CO2 24 05/04/2023 1023   GLUCOSE 108 (H) 05/04/2023 1023   BUN 23 (H) 05/04/2023 1023   CREATININE 0.92 05/04/2023 1023   CREATININE 1.08 01/07/2023 1055   CALCIUM 9.1 05/04/2023 1023   GFRNONAA >60 05/04/2023 1023   GFRAA >60 03/29/2019 0654    Lipid Panel     Component Value Date/Time   CHOL 171 01/07/2023 1055   TRIG 81 01/07/2023 1055   HDL 56 01/07/2023 1055   CHOLHDL 3.1 01/07/2023 1055   VLDL 16 01/24/2019 1112   LDLCALC 98 01/07/2023 1055    CBC    Component Value Date/Time   WBC 11.9 (H) 05/04/2023 1023   RBC 4.60 05/04/2023 1023   HGB 15.7 09/30/2023 0814   HCT 50.3 09/30/2023 0814   PLT 266 05/04/2023 1023   MCV 88.5 05/04/2023 1023   MCH 30.4 05/04/2023 1023   MCHC 34.4 05/04/2023 1023   RDW 12.7 05/04/2023 1023   LYMPHSABS 1.3 05/04/2023 1023   MONOABS 1.0 05/04/2023 1023   EOSABS 0.0 05/04/2023 1023   BASOSABS 0.1 05/04/2023 1023    Hgb A1C Lab Results  Component Value Date   HGBA1C 5.0 01/07/2023            Assessment & Plan:  Assessment and Plan    Left knee pain Chronic left knee pain for 4-5 years, worsened recently. Pain radiates to left ankle. Managed with oxycodone  and lidocaine  patches. - Order MRI of the left knee. - Advise  follow-up with orthopedics. - Continue current pain management regimen of oxycodone  20 mg daily as needed and Lidoderm  patches.  Chronic pain syndrome Chronic pain managed with oxycodone . Regular follow-up with pain management specialists. - Continue current pain management regimen of oxycodone  20 mg daily as needed and Lidoderm  patches. - Advise follow-up with pain management.  Allergic rhinitis Symptoms include mild headache, nasal drip, and occasional earache. Exacerbated by seasonal changes and outdoor activities. Steroid nasal spray causes headaches and epistaxis. - Prescribe prednisone  20 mg x 5 days. - Advise use of steroid nasal spray up to twice a day as needed.        Follow-up with your PCP as previously scheduled Angeline Laura, NP

## 2023-11-26 NOTE — Patient Instructions (Signed)
 Knee Exercises Ask your health care provider which exercises are safe for you. Do exercises exactly as told by your health care provider and adjust them as directed. It is normal to feel mild stretching, pulling, tightness, or discomfort as you do these exercises. Stop right away if you feel sudden pain or your pain gets worse. Do not begin these exercises until told by your health care provider. Stretching and range-of-motion exercises These exercises warm up your muscles and joints and improve the movement and flexibility of your knee. These exercises also help to relieve pain and swelling. Knee extension, prone  Lie on your abdomen (prone position) on a bed. Place your left / right knee just beyond the edge of the surface so your knee is not on the bed. You can put a towel under your left / right thigh just above your kneecap for comfort. Relax your leg muscles and allow gravity to straighten your knee (extension). You should feel a stretch behind your left / right knee. Hold this position for __________ seconds. Scoot up so your knee is supported between repetitions. Repeat __________ times. Complete this exercise __________ times a day. Knee flexion, active  Lie on your back with both legs straight. If this causes back discomfort, bend your left / right knee so your foot is flat on the floor. Slowly slide your left / right heel back toward your buttocks. Stop when you feel a gentle stretch in the front of your knee or thigh (flexion). Hold this position for __________ seconds. Slowly slide your left / right heel back to the starting position. Repeat __________ times. Complete this exercise __________ times a day. Quadriceps stretch, prone  Lie on your abdomen on a firm surface, such as a bed or padded floor. Bend your left / right knee and hold your ankle. If you cannot reach your ankle or pant leg, loop a belt around your foot and grab the belt instead. Gently pull your heel toward your  buttocks. Your knee should not slide out to the side. You should feel a stretch in the front of your thigh and knee (quadriceps). Hold this position for __________ seconds. Repeat __________ times. Complete this exercise __________ times a day. Hamstring, supine  Lie on your back (supine position). Loop a belt or towel over the ball of your left / right foot. The ball of your foot is on the walking surface, right under your toes. Straighten your left / right knee and slowly pull on the belt to raise your leg until you feel a gentle stretch behind your knee (hamstring). Do not let your knee bend while you do this. Keep your other leg flat on the floor. Hold this position for __________ seconds. Repeat __________ times. Complete this exercise __________ times a day. Strengthening exercises These exercises build strength and endurance in your knee. Endurance is the ability to use your muscles for a long time, even after they get tired. Quadriceps, isometric This exercise strengthens the muscles in front of your thigh (quadriceps) without moving your knee joint (isometric). Lie on your back with your left / right leg extended and your other knee bent. Put a rolled towel or small pillow under your knee if told by your health care provider. Slowly tense the muscles in the front of your left / right thigh. You should see your kneecap slide up toward your hip or see increased dimpling just above the knee. This motion will push the back of the knee toward the floor.  For __________ seconds, hold the muscle as tight as you can without increasing your pain. Relax the muscles slowly and completely. Repeat __________ times. Complete this exercise __________ times a day. Straight leg raises This exercise strengthens the muscles in front of your thigh (quadriceps) and the muscles that move your hips (hip flexors). Lie on your back with your left / right leg extended and your other knee bent. Tense the  muscles in the front of your left / right thigh. You should see your kneecap slide up or see increased dimpling just above the knee. Your thigh may even shake a bit. Keep these muscles tight as you raise your leg 4-6 inches (10-15 cm) off the floor. Do not let your knee bend. Hold this position for __________ seconds. Keep these muscles tense as you lower your leg. Relax your muscles slowly and completely after each repetition. Repeat __________ times. Complete this exercise __________ times a day. Hamstring, isometric  Lie on your back on a firm surface. Bend your left / right knee about __________ degrees. Dig your left / right heel into the surface as if you are trying to pull it toward your buttocks. Tighten the muscles in the back of your thighs (hamstring) to "dig" as hard as you can without increasing any pain. Hold this position for __________ seconds. Release the tension gradually and allow your muscles to relax completely for __________ seconds after each repetition. Repeat __________ times. Complete this exercise __________ times a day. Hamstring curls If told by your health care provider, do this exercise while wearing ankle weights. Begin with __________lb / kg weights. Then increase the weight by 1 lb (0.5 kg) increments. Do not wear ankle weights that are more than __________lb / kg. Lie on your abdomen with your legs straight. Bend your left / right knee as far as you can without feeling pain. Keep your hips flat against the floor. Hold this position for __________ seconds. Slowly lower your leg to the starting position. Repeat __________ times. Complete this exercise __________ times a day. Squats This exercise strengthens the muscles in front of your thigh and knee (quadriceps). Stand in front of a table, with your feet and knees pointing straight ahead. You may rest your hands on the table for balance but not for support. Slowly bend your knees and lower your hips like you  are going to sit in a chair. Keep your weight over your heels, not over your toes. Keep your lower legs upright so they are parallel with the table legs. Do not let your hips go lower than your knees. Do not bend lower than told by your health care provider. If your knee pain increases, do not bend as low. Hold the squat position for __________ seconds. Slowly push with your legs to return to standing. Do not use your hands to pull yourself to standing. Repeat __________ times. Complete this exercise __________ times a day. Wall slides This exercise strengthens the muscles in front of your thigh and knee (quadriceps). Lean your back against a smooth wall or door, and walk your feet out 18-24 inches (46-61 cm) from it. Place your feet hip-width apart. Slowly slide down the wall or door until your knees bend __________ degrees. Keep your knees over your heels, not over your toes. Keep your knees in line with your hips. Hold this position for __________ seconds. Repeat __________ times. Complete this exercise __________ times a day. Straight leg raises, side-lying This exercise strengthens the muscles that rotate  the leg at the hip and move it away from your body (hip abductors). Lie on your side with your left / right leg in the top position. Lie so your head, shoulder, knee, and hip line up. You may bend your bottom knee to help you keep your balance. Roll your hips slightly forward so your hips are stacked directly over each other and your left / right knee is facing forward. Leading with your heel, lift your top leg 4-6 inches (10-15 cm). You should feel the muscles in your outer hip lifting. Do not let your foot drift forward. Do not let your knee roll toward the ceiling. Hold this position for __________ seconds. Slowly return your leg to the starting position. Let your muscles relax completely after each repetition. Repeat __________ times. Complete this exercise __________ times a  day. Straight leg raises, prone This exercise stretches the muscles that move your hips away from the front of the pelvis (hip extensors). Lie on your abdomen on a firm surface. You can put a pillow under your hips if that is more comfortable. Tense the muscles in your buttocks and lift your left / right leg about 4-6 inches (10-15 cm). Keep your knee straight as you lift your leg. Hold this position for __________ seconds. Slowly lower your leg to the starting position. Let your leg relax completely after each repetition. Repeat __________ times. Complete this exercise __________ times a day. This information is not intended to replace advice given to you by your health care provider. Make sure you discuss any questions you have with your health care provider. Document Revised: 10/16/2020 Document Reviewed: 10/16/2020 Elsevier Patient Education  2024 [employer].

## 2023-11-30 DIAGNOSIS — Z79891 Long term (current) use of opiate analgesic: Secondary | ICD-10-CM | POA: Diagnosis not present

## 2023-11-30 DIAGNOSIS — M19019 Primary osteoarthritis, unspecified shoulder: Secondary | ICD-10-CM | POA: Diagnosis not present

## 2023-11-30 DIAGNOSIS — R202 Paresthesia of skin: Secondary | ICD-10-CM | POA: Diagnosis not present

## 2023-11-30 DIAGNOSIS — G894 Chronic pain syndrome: Secondary | ICD-10-CM | POA: Diagnosis not present

## 2023-12-02 ENCOUNTER — Ambulatory Visit
Admission: RE | Admit: 2023-12-02 | Discharge: 2023-12-02 | Disposition: A | Discharge status: Home / Self Care | Source: Ambulatory Visit | Attending: Internal Medicine | Admitting: Internal Medicine

## 2023-12-02 DIAGNOSIS — M25562 Pain in left knee: Secondary | ICD-10-CM | POA: Insufficient documentation

## 2023-12-02 DIAGNOSIS — M1712 Unilateral primary osteoarthritis, left knee: Secondary | ICD-10-CM | POA: Diagnosis not present

## 2023-12-02 DIAGNOSIS — S83232A Complex tear of medial meniscus, current injury, left knee, initial encounter: Secondary | ICD-10-CM | POA: Diagnosis not present

## 2023-12-02 DIAGNOSIS — G8929 Other chronic pain: Secondary | ICD-10-CM | POA: Diagnosis not present

## 2023-12-07 ENCOUNTER — Ambulatory Visit: Payer: Self-pay | Admitting: Internal Medicine

## 2023-12-16 ENCOUNTER — Ambulatory Visit: Payer: Self-pay

## 2023-12-16 NOTE — Telephone Encounter (Signed)
 Tried calling phone rang busy

## 2023-12-16 NOTE — Telephone Encounter (Signed)
 Left message for patient to return call.

## 2023-12-16 NOTE — Telephone Encounter (Addendum)
 FYI Only or Action Required?: Action required by provider: referral request. Appointment scheduled with PCP on 12/17/23  Patient was last seen in primary care on 11/26/2023 by Antonette Angeline ORN, NP.  Called Nurse Triage reporting Knee Pain.  Symptoms began chronic.  Interventions attempted: Prescription medications: oxycodone  and Rest, hydration, or home remedies.  Symptoms are: unchanged.  Triage Disposition: See PCP When Office is Open (Within 3 Days)  Patient/caregiver understands and will follow disposition?: Yes   Copied from CRM #[id number]. Topic: Clinical - Red Word Triage >> Dec 16, 2023 10:24 AM Victoria B wrote: [facility] that prompted transfer to Nurse Triage: Patient has severe knee pain on the left Reason for Disposition  [1] MODERATE pain (e.g., interferes with normal activities, limping) AND [2] present > 3 days  Answer Assessment - Initial Assessment Questions Additional info: 1) Requesting referral to orthopedics.   1. LOCATION and RADIATION: Where is the pain located?      Left knee 2. QUALITY: What does the pain feel like?  (e.g., sharp, dull, aching, burning)     Aching  3. SEVERITY: How bad is the pain? What does it keep you from doing?   (Scale 1-10; or mild, moderate, severe)     Reports severe but able to ambulate 4. ONSET: When did the pain start? Does it come and go, or is it there all the time?     Intermittently chronic 5. RECURRENT: Have you had this pain before? If Yes, ask: When, and what happened then?     Yes, scheduled for surgery  6. SETTING: Has there been any recent work, exercise or other activity that involved that part of the body?      No recent injury  7. AGGRAVATING FACTORS: What makes the knee pain worse? (e.g., walking, climbing stairs, running)     Worse with ambulation, wears brace, uses cane, has limp.  8. ASSOCIATED SYMPTOMS: Is there any swelling or redness of the knee?     Mild puffy  9. OTHER SYMPTOMS: Do  you have any other symptoms? (e.g., calf pain, chest pain, difficulty breathing, fever)     Denies other symptoms  Protocols used: Knee Pain-A-AH

## 2023-12-16 NOTE — Telephone Encounter (Signed)
 Can you call to confirm if he still needs orders on this orthopedic referral?  It looks like he already scheduled for Friday. He already has an apt with Dr [facility] for his Left Knee for Friday 10/31 already scheduled.  ------------------------- Updates - I reviewed recent MRI ordered by Angeline Laura, FNP shows extensive complex tear medial meniscus Left knee. He has history of Right knee surgery by Dr Edie 25 years ago.  Marsa Officer, DO [facility] 12/16/2023, 11:56 AM

## 2023-12-17 ENCOUNTER — Telehealth: Payer: Self-pay

## 2023-12-17 ENCOUNTER — Ambulatory Visit: Admitting: Family Medicine

## 2023-12-17 DIAGNOSIS — M5136 Other intervertebral disc degeneration, lumbar region with discogenic back pain only: Secondary | ICD-10-CM

## 2023-12-17 DIAGNOSIS — M4802 Spinal stenosis, cervical region: Secondary | ICD-10-CM

## 2023-12-17 DIAGNOSIS — M5030 Other cervical disc degeneration, unspecified cervical region: Secondary | ICD-10-CM

## 2023-12-17 DIAGNOSIS — M51369 Other intervertebral disc degeneration, lumbar region without mention of lumbar back pain or lower extremity pain: Secondary | ICD-10-CM

## 2023-12-17 DIAGNOSIS — G8929 Other chronic pain: Secondary | ICD-10-CM

## 2023-12-17 NOTE — Addendum Note (Signed)
 Addended by: EDMAN MARSA PARAS on: 12/17/2023 02:36 PM   Modules accepted: Orders

## 2023-12-17 NOTE — Telephone Encounter (Signed)
 Copied from CRM #[id number]. Topic: Referral - Status >> Dec 17, 2023 12:06 PM Myrick T wrote: Reason for CRM: patient called to f/u on the referral he requested [facility] at [address] [facility] but patient would like to see Dr Rebbecca Brighter at the [facility] location. Please f/u with patient

## 2023-12-17 NOTE — Telephone Encounter (Signed)
 Could you return his call to notify him that I have placed the referral?  Sent to [facility] location to see Dr Garnell Lather  [facility]  [address] [place], [place] [address]  Phone: [phone] Fax: [phone]  Marsa Officer, DO [facility] 12/17/2023, 2:36 PM

## 2023-12-17 NOTE — Telephone Encounter (Signed)
 Sent patient a Well[employer] geologist.

## 2023-12-20 ENCOUNTER — Other Ambulatory Visit: Payer: Self-pay | Admitting: Urology

## 2023-12-20 DIAGNOSIS — E291 Testicular hypofunction: Secondary | ICD-10-CM

## 2023-12-30 ENCOUNTER — Ambulatory Visit: Payer: Self-pay

## 2023-12-30 ENCOUNTER — Telehealth (INDEPENDENT_AMBULATORY_CARE_PROVIDER_SITE_OTHER): Admitting: Family Medicine

## 2023-12-30 DIAGNOSIS — M25562 Pain in left knee: Secondary | ICD-10-CM | POA: Diagnosis not present

## 2023-12-30 DIAGNOSIS — J0110 Acute frontal sinusitis, unspecified: Secondary | ICD-10-CM

## 2023-12-30 DIAGNOSIS — M23322 Other meniscus derangements, posterior horn of medial meniscus, left knee: Secondary | ICD-10-CM

## 2023-12-30 DIAGNOSIS — G8929 Other chronic pain: Secondary | ICD-10-CM | POA: Diagnosis not present

## 2023-12-30 MED ORDER — PREDNISONE 10 MG PO TABS: ORAL_TABLET | ORAL | 0 refills | Status: DC

## 2023-12-30 MED ORDER — AZITHROMYCIN 250 MG PO TABS: ORAL_TABLET | ORAL | 0 refills | Status: DC

## 2023-12-30 NOTE — Progress Notes (Signed)
 Subjective:    Patient ID: Gary Schultz, male    DOB: May 16, 1965, 58 y.o.   MRN: [id number]  Gary Schultz is a 58 y.o. male presenting on 12/30/2023 for Sinusitis   Virtual / Telehealth Encounter - Video Visit via MyChart The purpose of this virtual visit is to provide medical care while limiting exposure to the novel coronavirus (COVID19) for both patient and office staff.  Consent was obtained for remote visit:  Yes.   Answered questions that patient had about telehealth interaction:  Yes.   I discussed the limitations, risks, security and privacy concerns of performing an evaluation and management service by video/telephone. I also discussed with the patient that there may be a patient responsible charge related to this service. The patient expressed understanding and agreed to proceed.  Patient Location: Home Provider Location: [facility] (Office)  Participants in virtual visit: - Patient: [name] - CMA: Alan Fontana CMA - Provider: Dr Edman   HPI  Discussed the use of AI scribe software for clinical note transcription with the patient, who gave verbal consent to proceed.  History of Present Illness   Gary Schultz is a 58 year old male who presents with sinus symptoms and earache.  Sinusitis - Sinus congestion and earache present for 3-4 days - Attributes symptoms to change in weather and allergies - Over-the-counter medications have not provided relief - No fever - Last similar episode occurred last fall, treated with Augmentin  and a steroid burst - Currently using Flonase  once or twice daily for congestion - Cautious about overuse of Flonase  due to potential side effects such as epistaxis and headaches - Last use of systemic steroids was in May, with good tolerance  Left knee pain and degenerative joint disease - Recent MRI of left knee performed - Severe arthritis pain in left knee and meniscus tear - Steroid  injection administered in October for pain management - Knee replacement surgery scheduled for December 9th by Dr Edie - History of right knee replacement            11/26/2023    9:22 AM 01/07/2023   10:09 AM 10/06/2022   11:33 AM  Depression screen PHQ 2/9  Decreased Interest 0 2 3  Down, Depressed, Hopeless 0 1 2  PHQ - 2 Score 0 3 5  Altered sleeping 0 1 1  Tired, decreased energy 0 1 3  Change in appetite 0 1 2  Feeling bad or failure about yourself  0 1 1  Trouble concentrating 0 1 2  Moving slowly or fidgety/restless 0 0 1  Suicidal thoughts 0 0 0  PHQ-9 Score 0  8  15   Difficult doing work/chores Not difficult at all Somewhat difficult Extremely dIfficult     Data saved with a previous flowsheet row definition       11/26/2023    9:22 AM 01/07/2023   10:10 AM 10/06/2022   11:33 AM 04/29/2022    8:37 AM  GAD 7 : Generalized Anxiety Score  Nervous, Anxious, on Edge 0 1 2 1   Control/stop worrying 0 1 1 1   Worry too much - different things 0 1 1 1   Trouble relaxing 0 1 2 1   Restless 0 1 1 1   Easily annoyed or irritable 0 1 1 1   Afraid - awful might happen 0 1 0 0  Total GAD 7 Score 0 7 8 6   Anxiety Difficulty Not difficult at all Somewhat difficult Not  difficult at all Somewhat difficult    Social History   Tobacco Use   Smoking status: Never    Passive exposure: Never   Smokeless tobacco: Never  Vaping Use   Vaping status: Never Used  Substance Use Topics   Alcohol use: Not Currently    Comment: occassional   Drug use: Yes    Comment: prescribed oxy 20mg  and muscle relaxers    Review of Systems Per HPI unless specifically indicated above     Objective:    There were no vitals taken for this visit.  Wt Readings from Last 3 Encounters:  11/26/23 200 lb (90.7 kg)  07/08/23 195 lb (88.5 kg)  06/23/23 195 lb (88.5 kg)     Physical Exam  Note examination was completely remotely via video observation objective data only  Gen - well-appearing,  no acute distress or apparent pain, comfortable HEENT - eyes appear clear without discharge or redness. Nasal congestion Heart/Lungs - cannot examine virtually - no evidence of coughing or labored breathing. Abd - cannot examine virtually  Skin - face visible today- no rash Neuro - awake, alert, oriented Psych - not anxious appearing   I have personally reviewed the radiology report from 12/02/23 on MRI Left Knee.  EXAM: MRI of the left Knee Without Contrast. 12/02/2023 05:16:43 PM   TECHNIQUE: Multiplanar multisequence MRI of the left knee was performed without intravenous contrast.   COMPARISON: Plain films left knee 11/08/2015.   CLINICAL HISTORY: Knee pain, chronic, degenerative disease on xray (Age >= 5y). MRI left knee w/o // chronic generalized knee pain // pain worse with activity // NKI.   FINDINGS:   MEDIAL MENISCUS: Extensive complex degenerative tearing is seen throughout the posterior horn and body of the medial meniscus.   LATERAL MENISCUS: Intact lateral meniscus.   ANTERIOR CRUCIATE LIGAMENT: Intact anterior cruciate ligament.   POSTERIOR CRUCIATE LIGAMENT: Intact posterior cruciate ligament.   EXTENSOR MECHANISM: Intact quadriceps and patellar tendons. Intact patellar retinacula.   LATERAL COLLATERAL LIGAMENT COMPLEX: Intact IT band, lateral collateral ligament proper, biceps femoris tendon and popliteus tendon.   MEDIAL COLLATERAL LIGAMENT COMPLEX: The superficial and deep components of the medial collateral ligament are intact.   KNEE JOINT: The patient has tricompartmental osteoarthritis, which is severe medially where hyaline cartilage is almost completely denuded. There is extensive subchondral edema in both the medial tibia and the medial tibial plateau. A subchondral cyst in the posterior aspect of the medial tibia measures 2.5 x 1.5 x 1.7 cm. Physiologic amount of joint fluid.   BONE MARROW: No acute fracture or aggressive marrow  replacing lesion.   SOFT TISSUE: No focal abnormality.   IMPRESSION: 1. Extensive complex degenerative tearing of the posterior horn and body of the medial meniscus. 2. Tricompartmental osteoarthritis, severe medially where there is near-complete cartilage loss, extensive subchondral edema, and a 2.5 cm subchondral cyst in the posterior medial tibia.   Electronically signed by: Debby Holland MD 12/07/2023 10:08 AM EDT RP Workstation: [id number]  Results for orders placed or performed in visit on 09/30/23  PSA   Collection Time: 09/30/23  8:14 AM  Result Value Ref Range   Prostate Specific Ag, Serum 0.5 0.0 - 4.0 ng/mL  Testosterone    Collection Time: 09/30/23  8:14 AM  Result Value Ref Range   Testosterone  134 (L) 264 - 916 ng/dL  Hemoglobin and hematocrit, blood   Collection Time: 09/30/23  8:14 AM  Result Value Ref Range   Hemoglobin 15.7 13.0 - 17.7 g/dL  Hematocrit 50.3 37.5 - 51.0 %      Assessment & Plan:   Problem List Items Addressed This Visit   None Visit Diagnoses       Acute non-recurrent frontal sinusitis    -  Primary   Relevant Medications   azithromycin  (ZITHROMAX  Z-PAK) 250 MG tablet   predniSONE  (DELTASONE ) 10 MG tablet     Chronic pain of left knee       Relevant Medications   predniSONE  (DELTASONE ) 10 MG tablet     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 No fever. Previous similar episode treated with Augmentin  and steroids. Currently using Flonase  with caution due to potential side effects. Steroid tolerance confirmed. - Prescribed Z-Pak (azithromycin ) for sinus infection. - Prescribed prednisone  for inflammation and congestion. - Sent prescriptions to pharmacy at [facility].      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   Meds ordered this encounter  Medications   azithromycin  (ZITHROMAX  Z-PAK) 250 MG tablet    Sig: Take 2 tabs (500mg  total) on Day 1. Take 1 tab (250mg ) daily for next 4 days.    Dispense:  6 tablet    Refill:  0   predniSONE  (DELTASONE ) 10 MG tablet    Sig: Take 6 tabs with breakfast Day 1, 5 tabs Day 2, 4 tabs Day 3, 3 tabs Day 4, 2 tabs Day 5, 1 tab Day 6.    Dispense:  21 tablet    Refill:  0    Follow up plan: Return if symptoms worsen or fail to improve.   Patient verbalizes understanding with the above medical recommendations including the limitation of remote medical advice.  Specific follow-up and call-back criteria were given for patient to follow-up or seek medical care more urgently if needed.  Total duration of direct patient care provided via video conference: 7 minutes   Marsa Officer, DO [facility] 12/30/2023, 11:03 AM

## 2023-12-30 NOTE — Patient Instructions (Addendum)
 Thank you for coming to the office today.    Please schedule a Follow-up Appointment to: Return if symptoms worsen or fail to improve.  If you have any other questions or concerns, please feel free to call the office or send a message through MyChart. You may also schedule an earlier appointment if necessary.  Additionally, you may be receiving a survey about your experience at our office within a few days to 1 week by e-mail or mail. We value your feedback.  Marsa Officer, DO [facility], [place]

## 2023-12-30 NOTE — Telephone Encounter (Signed)
 FYI Only or Action Required?: FYI only for provider: virtual visit today.  Patient was last seen in primary care on 11/26/2023 by Antonette Angeline ORN, NP.  Called Nurse Triage reporting Sinusitis.  Symptoms began several days ago.  Interventions attempted: OTC medications: sudafed.  Symptoms are: unchanged.  Triage Disposition: See Physician Within 24 Hours  Patient/caregiver understands and will follow disposition?:   Reason for Disposition  Earache  Answer Assessment - Initial Assessment Questions Pt reports sinusitis. States has historically been prescribed prenisone and antibiotics for same. Has physical 01/11/24.   1. LOCATION: Where does it hurt?      Sinus and ears  2. ONSET: When did the sinus pain start?  (e.g., hours, days)      3-4 days  3. SEVERITY: How bad is the pain?   (Scale 0-10; or none, mild, moderate or severe)     Mild  4. RECURRENT SYMPTOM: Have you ever had sinus problems before? If Yes, ask: When was the last time? and What happened that time?      Yes, states with season changes  Protocols used: Sinus Pain or Congestion-A-AH Copied from CRM #[id number]. Topic: Clinical - Red Word Triage >> Dec 30, 2023  9:02 AM Rosina BIRCH wrote: Reason for RMF:[id number], right ear ache/pain off and on, congested and not coughing much. Patient stated its because of the weather change it has prompted these symptoms

## 2023-12-31 ENCOUNTER — Other Ambulatory Visit: Payer: Self-pay | Admitting: Family Medicine

## 2023-12-31 DIAGNOSIS — I10 Essential (primary) hypertension: Secondary | ICD-10-CM

## 2023-12-31 DIAGNOSIS — Z0000 Encounter for general adult medical examination without abnormal findings: Secondary | ICD-10-CM

## 2023-12-31 DIAGNOSIS — N401 Enlarged prostate with lower urinary tract symptoms: Secondary | ICD-10-CM

## 2023-12-31 DIAGNOSIS — E7800 Pure hypercholesterolemia, unspecified: Secondary | ICD-10-CM

## 2023-12-31 DIAGNOSIS — R7309 Other abnormal glucose: Secondary | ICD-10-CM

## 2024-01-01 ENCOUNTER — Other Ambulatory Visit: Payer: Self-pay

## 2024-01-02 LAB — COMPREHENSIVE METABOLIC PANEL WITH GFR
AG Ratio: 2.4 (calc) (ref 1.0–2.5)
ALT: 16 U/L (ref 9–46)
AST: 17 U/L (ref 10–35)
Albumin: 4.4 g/dL (ref 3.6–5.1)
Alkaline phosphatase (APISO): 57 U/L (ref 35–144)
BUN: 15 mg/dL (ref 7–25)
CO2: 25 mmol/L (ref 20–32)
Calcium: 9.4 mg/dL (ref 8.6–10.3)
Chloride: 106 mmol/L (ref 98–110)
Creat: 0.85 mg/dL (ref 0.70–1.30)
Globulin: 1.8 g/dL — ABNORMAL LOW (ref 1.9–3.7)
Glucose, Bld: 98 mg/dL (ref 65–99)
Potassium: 3.8 mmol/L (ref 3.5–5.3)
Sodium: 142 mmol/L (ref 135–146)
Total Bilirubin: 0.6 mg/dL (ref 0.2–1.2)
Total Protein: 6.2 g/dL (ref 6.1–8.1)
eGFR: 102 mL/min/1.73m2 (ref >=60)

## 2024-01-02 LAB — LIPID PANEL
Cholesterol: 154 mg/dL (ref <200)
HDL: 60 mg/dL (ref >=40)
LDL Cholesterol (Calc): 81 mg/dL
Non-HDL Cholesterol (Calc): 94 mg/dL (ref <130)
Total CHOL/HDL Ratio: 2.6 (calc) (ref <5.0)
Triglycerides: 52 mg/dL (ref <150)

## 2024-01-02 LAB — CBC WITH DIFFERENTIAL/PLATELET
Absolute Lymphocytes: 2000 {cells}/uL (ref 850–3900)
Absolute Monocytes: 759 {cells}/uL (ref 200–950)
Basophils Absolute: 29 {cells}/uL (ref 0–200)
Basophils Relative: 0.4 %
Eosinophils Absolute: 22 {cells}/uL (ref 15–500)
Eosinophils Relative: 0.3 %
HCT: 47.1 % (ref 38.5–50.0)
Hemoglobin: 15.6 g/dL (ref 13.2–17.1)
MCH: 30.6 pg (ref 27.0–33.0)
MCHC: 33.1 g/dL (ref 32.0–36.0)
MCV: 92.5 fL (ref 80.0–100.0)
MPV: 11 fL (ref 7.5–12.5)
Monocytes Relative: 10.4 %
Neutro Abs: 4490 {cells}/uL (ref 1500–7800)
Neutrophils Relative %: 61.5 %
Platelets: 181 Thousand/uL (ref 140–400)
RBC: 5.09 Million/uL (ref 4.20–5.80)
RDW: 11.8 % (ref 11.0–15.0)
Total Lymphocyte: 27.4 %
WBC: 7.3 Thousand/uL (ref 3.8–10.8)

## 2024-01-02 LAB — PSA: PSA: 0.71 ng/mL (ref <=4.00)

## 2024-01-02 LAB — HEMOGLOBIN A1C
Hgb A1c MFr Bld: 5 % (ref <5.7)
Mean Plasma Glucose: 97 mg/dL
eAG (mmol/L): 5.4 mmol/L

## 2024-01-02 LAB — TSH: TSH: 0.82 m[IU]/L (ref 0.40–4.50)

## 2024-01-04 ENCOUNTER — Ambulatory Visit: Payer: Self-pay | Admitting: Family Medicine

## 2024-01-04 DIAGNOSIS — M23322 Other meniscus derangements, posterior horn of medial meniscus, left knee: Secondary | ICD-10-CM

## 2024-01-04 DIAGNOSIS — M4802 Spinal stenosis, cervical region: Secondary | ICD-10-CM

## 2024-01-04 DIAGNOSIS — G8929 Other chronic pain: Secondary | ICD-10-CM

## 2024-01-04 DIAGNOSIS — M5136 Other intervertebral disc degeneration, lumbar region with discogenic back pain only: Secondary | ICD-10-CM

## 2024-01-04 MED ORDER — OXYCODONE HCL 10 MG PO TABS: 10.0000 mg | ORAL_TABLET | ORAL | 0 refills | Status: AC | PRN

## 2024-01-04 MED ORDER — GABAPENTIN 600 MG PO TABS: 600.0000 mg | ORAL_TABLET | Freq: Three times a day (TID) | ORAL | 0 refills | Status: DC

## 2024-01-04 NOTE — Telephone Encounter (Signed)
 FYI Only or Action Required?: Action required by provider: medication refill request.  Patient was last seen in primary care on 12/30/2023 by Edman Marsa PARAS, DO.  Called Nurse Triage reporting Medication Refill, Diarrhea, and Hypertension.  Symptoms began today.  Interventions attempted: Prescription medications: oxycodone  and gabapentin yesterday took last doses and OTC medication: Imodium.  Symptoms are: chronic knee, back, neck pain; insomnia; hypertension 160/85; diarrhea (3 episodes today) stable.  Triage Disposition: Call PCP When Office is Open  Patient/caregiver understands and will follow disposition?: Yes          Copied from CRM #[id number]. Topic: Clinical - Prescription Issue >> Jan 04, 2024  9:35 AM Darshell M wrote: Reason for CRM: Oxycodone  HCl 20 MG TABS and Gabapentin. Patient was referred pain management but [facility] has been unresponsive for medication management. Patient took last medication yesterday morning. Patient having withdrawal symptoms. Patient needs refill of these meds prior to appointment on 01/08/2024. Patient CB# [phone]. Reason for Disposition  Caller requesting a CONTROLLED substance prescription refill (e.g., narcotics, ADHD medicines)  Answer Assessment - Initial Assessment Questions 1. DRUG NAME: What medicine do you need to have refilled?     Oxycodone  20mg  every 4 hours as needed; gabapentin 1800mg  tid.  2. REFILLS REMAINING: How many refills are remaining? Notes: The label on the medicine or pill bottle will show how many refills are remaining. If there are no refills remaining, then a renewal may be needed.     0.  3. EXPIRATION DATE: What is the expiration date? Note: The label states when the prescription will expire, and thus can no longer be refilled.)     N/A.  4. PRESCRIBER: Who prescribed it? Note: The prescribing doctor or group is responsible for refill approvals..     Patient states he  is requesting Dr Edman to prescribe the medications because he has not been able to get in with [facility] pain management (referral was placed by PCP). He states he called the pain management office and they have not called him back to schedule. Was previously prescribed by Eleanor Griffins APRN.  5. PHARMACY: Have you contacted your pharmacy (drugstore)? Note: Some pharmacies will contact the doctor (or NP/PA).      No.  6. SYMPTOMS: Do you have any symptoms?     He states his last dose of his medications were yesterday and he is having withdrawal. Hypertension, states his BP this morning is 160/85. Diarrhea (3 episodes today, he states he took Imodium). Insomnia. Bilateral knee, neck and back chronic pain; currently 7.5/10. He states he is also using heat and ice for pain.  Protocols used: Medication Refill and Renewal Call-A-AH

## 2024-01-04 NOTE — Telephone Encounter (Signed)
 Called patient. He is unable to schedule with [facility] due to limited availability on their end, he was offered [place] location and he cannot travel. He is waiting on apt. His previous pain management was ordering Oxycodone  20mg  6 times per day every hours AS NEEDED and he was receiving 130 pills for 30 days. I explained that as primary care I cannot write this much oxycodone , I would be able to provide reduced dosing temporarily for week supply until we can sort out the referral. New rx oxycodone  10mg  (so half dose) for every 4 hours as needed 6 per day = 7 days = 42 pills rx sent and Gabapentin 600mg  3 times a day for 30 days. Orders sent to pharmacy today. He will f/u as planned next week  Marsa Officer, DO [facility] 01/04/2024, 2:54 PM

## 2024-01-07 ENCOUNTER — Ambulatory Visit: Payer: Self-pay | Admitting: Family Medicine

## 2024-01-07 NOTE — Telephone Encounter (Signed)
 I attempted to call Valere back today. He called our office and spoke to Rachell and he needs assistance with the pain medicine / referral. We spoke about this on Monday 11/17 I called him directly and see my documentation below.  If he calls back, let me know what exactly is his concern so I can try to help. FYI he should be aware that I am out of the office tomorrow.  Marsa Officer, DO [facility] 01/07/2024, 2:43 PM

## 2024-01-08 ENCOUNTER — Encounter: Payer: Self-pay | Admitting: Family Medicine

## 2024-01-12 ENCOUNTER — Other Ambulatory Visit: Payer: Self-pay | Admitting: Surgery

## 2024-01-12 ENCOUNTER — Telehealth: Payer: Self-pay | Admitting: Family Medicine

## 2024-01-12 DIAGNOSIS — M5136 Other intervertebral disc degeneration, lumbar region with discogenic back pain only: Secondary | ICD-10-CM

## 2024-01-12 DIAGNOSIS — G8929 Other chronic pain: Secondary | ICD-10-CM

## 2024-01-12 DIAGNOSIS — M4802 Spinal stenosis, cervical region: Secondary | ICD-10-CM

## 2024-01-12 DIAGNOSIS — M23322 Other meniscus derangements, posterior horn of medial meniscus, left knee: Secondary | ICD-10-CM

## 2024-01-12 MED ORDER — OXYCODONE HCL 10 MG PO TABS: 10.0000 mg | ORAL_TABLET | ORAL | 0 refills | Status: DC | PRN

## 2024-01-12 NOTE — Telephone Encounter (Signed)
 Copied from CRM #[id number]. Topic: Clinical - Medication Refill >> Jan 12, 2024 11:10 AM Amy B wrote: Medication: oxyCODONE  (Oxy IR/ROXICODONE ) immediate release tablet 10 mg 7 day supply  Has the patient contacted their pharmacy? No (Agent: If no, request that the patient contact the pharmacy for the refill. If patient does not wish to contact the pharmacy document the reason why and proceed with request.) (Agent: If yes, when and what did the pharmacy advise?)  This is the patient's preferred pharmacy:  [id number] - [place], [place] - [address] AT [facility] [address] [place] [address] Phone: [phone] Fax: [phone]  Is this the correct pharmacy for this prescription? Yes If no, delete pharmacy and type the correct one.   Has the prescription been filled recently? No  Is the patient out of the medication? Yes  Has the patient been seen for an appointment in the last year OR does the patient have an upcoming appointment? Yes  Can we respond through MyChart? Yes  Agent: Please be advised that Rx refills may take up to 3 business days. We ask that you follow-up with your pharmacy.

## 2024-01-12 NOTE — Telephone Encounter (Signed)
 Please notify him. New rx Oxycodone  10mg  [facility]  Marsa Officer, DO [facility] 01/12/2024, 12:39 PM

## 2024-01-12 NOTE — Telephone Encounter (Signed)
 Unable to pend, last fill  03/25

## 2024-01-18 ENCOUNTER — Encounter: Payer: Self-pay | Admitting: Family Medicine

## 2024-01-18 ENCOUNTER — Ambulatory Visit: Admitting: Family Medicine

## 2024-01-18 VITALS — BP 138/86 | HR 98 | Ht 73.0 in | Wt 197.2 lb

## 2024-01-18 DIAGNOSIS — I10 Essential (primary) hypertension: Secondary | ICD-10-CM

## 2024-01-18 DIAGNOSIS — M25561 Pain in right knee: Secondary | ICD-10-CM | POA: Diagnosis not present

## 2024-01-18 DIAGNOSIS — Z0000 Encounter for general adult medical examination without abnormal findings: Secondary | ICD-10-CM | POA: Diagnosis not present

## 2024-01-18 DIAGNOSIS — N401 Enlarged prostate with lower urinary tract symptoms: Secondary | ICD-10-CM

## 2024-01-18 DIAGNOSIS — G2581 Restless legs syndrome: Secondary | ICD-10-CM

## 2024-01-18 DIAGNOSIS — G8929 Other chronic pain: Secondary | ICD-10-CM

## 2024-01-18 DIAGNOSIS — G5600 Carpal tunnel syndrome, unspecified upper limb: Secondary | ICD-10-CM

## 2024-01-18 DIAGNOSIS — M23322 Other meniscus derangements, posterior horn of medial meniscus, left knee: Secondary | ICD-10-CM

## 2024-01-18 DIAGNOSIS — R351 Nocturia: Secondary | ICD-10-CM

## 2024-01-18 DIAGNOSIS — F3340 Major depressive disorder, recurrent, in remission, unspecified: Secondary | ICD-10-CM

## 2024-01-18 DIAGNOSIS — F411 Generalized anxiety disorder: Secondary | ICD-10-CM

## 2024-01-18 DIAGNOSIS — M5136 Other intervertebral disc degeneration, lumbar region with discogenic back pain only: Secondary | ICD-10-CM

## 2024-01-18 DIAGNOSIS — M4802 Spinal stenosis, cervical region: Secondary | ICD-10-CM

## 2024-01-18 DIAGNOSIS — M25562 Pain in left knee: Secondary | ICD-10-CM

## 2024-01-18 MED ORDER — OXYCODONE HCL 10 MG PO TABS: 10.0000 mg | ORAL_TABLET | ORAL | 0 refills | Status: DC | PRN

## 2024-01-18 MED ORDER — LISINOPRIL 30 MG PO TABS: 30.0000 mg | ORAL_TABLET | Freq: Every day | ORAL | 3 refills | Status: DC

## 2024-01-18 MED ORDER — ROPINIROLE HCL 0.5 MG PO TABS: 0.5000 mg | ORAL_TABLET | Freq: Every day | ORAL | 2 refills | Status: AC

## 2024-01-18 NOTE — Patient Instructions (Addendum)
 Thank you for coming to the office today.  Add Ropinirole 0.5mg  nightly for restless leg syndrome  Labs look great overall  Recent Labs    01/01/24 0841  HGBA1C 5.0   LDL 81, well controlled  --------------  30 day pain medicine ordered today  CHRONIC PAIN MANAGEMENT   [facility] (Injections only mostly) Address: [address] [place], [place], [place] [address] Phone: [phone]   [facility] Ph: [phone] Fax: [phone]   [facility] RUTHELLEN CHILD [address] [place], [place] [address] Phone: [phone] Fax: [phone]   [facility] [address] [place], [place] [address] Ph: [phone] (Multiple locations in [place] / [place])   [facility] [address] [facility] Office Building [address] Floor [place], [place]  [address] Appointments: [phone]   [facility] Address: [address] [place], [place] [address] Phone: [phone]  Dr Darletta Bathe  [facility] [address] [place], [place] [address] Ph: [phone] Fax: [phone]   Please schedule a Follow-up Appointment to: Return in about 4 weeks (around 02/15/2024) for 1 month follow-up Pain Management update.  If you have any other questions or concerns, please feel free to call the office or send a message through MyChart. You may also schedule an earlier appointment if necessary.  Additionally, you may be receiving a survey about your experience at our office within a few days to 1 week by e-mail or mail. We value your feedback.  Marsa Officer, DO [facility], [place]

## 2024-01-18 NOTE — Progress Notes (Signed)
 Subjective:    Patient ID: Gary Schultz, male    DOB: Jan 27, 1966, 58 y.o.   MRN: [id number]  Gary Schultz is a 58 y.o. male presenting on 01/18/2024 for Annual Exam   HPI  Discussed the use of AI scribe software for clinical note transcription with the patient, who gave verbal consent to proceed.  History of Present Illness   Gary Schultz is a 59 year old male with chronic pain and arthritis who presents for an annual physical exam and management of pain medication.  Chronic musculoskeletal pain - Significant pain in neck, back, lower back, knee, and ankle - Pain disrupts sleep No longer established with pain management, previous provider Dr Dannial retired and he was following at the office with new provider but he is no longer traveling to that location and prefers more local option. I have been managing his opioid therapy short term for the past month and he has been on reduced dosage. - Current oxycodone  dose is 10 mg daily, reduced from 20 mg; 10 mg dose provides relief temporarily only and has some withdrawal concerns. - Long-term oxycodone  use, previously at higher doses - Concern for withdrawal symptoms with lower dose - History of knee replacement surgery, which was painful and exacerbated sciatic nerve pain - Scheduled for another knee replacement on December 9th; pre-admission testing on December 3rd - History of back surgery for herniated and bulging discs - Diagnosed with degenerative disc disease - Glucosamine taken for joint health  Restless leg syndrome and neuropathic symptoms - Symptoms consistent with restless leg syndrome, associated with discontinuation of gabapentin  about one month ago - Gabapentin  previously prescribed at 600 mg three times daily, discontinued due to dry mouth and memory loss - Restless leg symptoms were milder with increased physical activity, including swimming and working out  - Thick mucus in the mornings  - No fever or  current illness - Nasal spray and allergy pills used without relief - Suspects symptoms may be related to medication changes - Drinks aloe vera juice for acid reflux - No current acid reflux medication; relies on dietary changes  Functional status and lifestyle modifications - Diet focused on fruits, nuts, and protein; avoids raw fish after adverse reaction to oysters - Weight loss achieved and regular exercise routine maintained - No exercise in past two weeks due to feeling unwell from medication changes  Hypertension management - Currently taking lisinopril  for blood pressure control - Monitors blood pressure daily with good control     Advanced Osteoarthritis Bilateral knees History of Meniscus Tear Hypertension    PTSD, GAD, ADHD, Memory Loss [facility]         01/18/2024    1:50 PM 11/26/2023    9:22 AM 01/07/2023   10:09 AM  Depression screen PHQ 2/9  Decreased Interest 1 0 2  Down, Depressed, Hopeless 1 0 1  PHQ - 2 Score 2 0 3  Altered sleeping 1 0 1  Tired, decreased energy 1 0 1  Change in appetite 1 0 1  Feeling bad or failure about yourself  2 0 1  Trouble concentrating 1 0 1  Moving slowly or fidgety/restless 2 0 0  Suicidal thoughts 0 0 0  PHQ-9 Score 10 0  8   Difficult doing work/chores Somewhat difficult Not difficult at all Somewhat difficult     Data saved with a previous flowsheet row definition       01/18/2024  1:51 PM 11/26/2023    9:22 AM 01/07/2023   10:10 AM 10/06/2022   11:33 AM  GAD 7 : Generalized Anxiety Score  Nervous, Anxious, on Edge 1 0 1 2  Control/stop worrying 2 0 1 1  Worry too much - different things 2 0 1 1  Trouble relaxing 2 0 1 2  Restless 2 0 1 1  Easily annoyed or irritable 2 0 1 1  Afraid - awful might happen 0 0 1 0  Total GAD 7 Score 11 0 7 8  Anxiety Difficulty Very difficult Not difficult at all Somewhat difficult Not difficult at all     Past Medical History:  Diagnosis Date   Anxiety     Carpal tunnel syndrome    bilateral   DDD (degenerative disc disease), lumbar    Generalized headaches    from neck injury   GERD (gastroesophageal reflux disease)    Hypertension    Neck pain    Neuromuscular disorder (HCC)    Varicose veins    Past Surgical History:  Procedure Laterality Date   BACK SURGERY  2006   L4-L5 herniated disc repair   CARPAL TUNNEL RELEASE Right 12/22/2019   Procedure: CARPAL TUNNEL RELEASE ENDOSCOPIC;  Surgeon: Edie Norleen PARAS, MD;  Location: [facility];  Service: Orthopedics;  Laterality: Right;   CARPAL TUNNEL RELEASE Left 02/29/2020   Procedure: CARPAL TUNNEL RELEASE ENDOSCOPIC;  Surgeon: Edie Norleen PARAS, MD;  Location: [facility];  Service: Orthopedics;  Laterality: Left;   CLAVICLE SURGERY Right    COLONOSCOPY WITH PROPOFOL  N/A 06/08/2020   Procedure: COLONOSCOPY WITH PROPOFOL ;  Surgeon: Jinny Carmine, MD;  Location: [facility];  Service: Endoscopy;  Laterality: N/A;   FRACTURE SURGERY Left 2019   KNEE ARTHROSCOPY WITH MEDIAL MENISECTOMY Left 09/28/2018   Procedure: KNEE ARTHROSCOPY WITH DEBRIDEMENT, abrasion chondroplasty of patella, PARTIAL MEDIAL MENISCECTOMY;  Surgeon: Edie Norleen PARAS, MD;  Location: [facility];  Service: Orthopedics;  Laterality: Left;   KNEE ARTHROSCOPY WITH MEDIAL MENISECTOMY Right 12/07/2018   Procedure: RIGHT KNEE ARTHROSCOPY WITH DEBRIDEMENT, REPAIR, PARTIAL MEDIAL MENISECTOMY;  Surgeon: Edie Norleen PARAS, MD;  Location: [facility];  Service: Orthopedics;  Laterality: Right;   REPAIR OF PERONEUS BREVIS TENDON Left 03/29/2019   Procedure: Debridement of chronic peroneus brevis tendinopathy with reconstruction of calcaneofibular and anterior talofibular ligaments;  Surgeon: Edie Norleen PARAS, MD;  Location: [facility];  Service: Orthopedics;  Laterality: Left;   SHOULDER SURGERY Right 1992   TOTAL KNEE ARTHROPLASTY Right 04/22/2023   Procedure: TOTAL KNEE ARTHROPLASTY - RNFA;  Surgeon: Edie Norleen PARAS, MD;  Location: [facility];  Service:  Orthopedics;  Laterality: Right;   Social History   Socioeconomic History   Marital status: Married    Spouse name: Sari   Number of children: Not on file   Years of education: Not on file   Highest education level: 10th grade  Occupational History   Not on file  Tobacco Use   Smoking status: Never    Passive exposure: Never   Smokeless tobacco: Never  Vaping Use   Vaping status: Never Used  Substance and Sexual Activity   Alcohol use: Not Currently    Comment: occassional   Drug use: Yes    Comment: prescribed oxy 20mg  and muscle relaxers   Sexual activity: Yes  Other Topics Concern   Not on file  Social History Narrative   Not on file   Social Drivers of Health   Financial Resource Strain: Medium Risk (  12/30/2023)   Overall Financial Resource Strain (CARDIA)    Difficulty of Paying Living Expenses: Somewhat hard  Food Insecurity: Food Insecurity Present (12/30/2023)   Hunger Vital Sign    Worried About Running Out of Food in the Last Year: Sometimes true    Ran Out of Food in the Last Year: Sometimes true  Transportation Needs: No Transportation Needs (12/30/2023)   PRAPARE - Administrator, Civil Service (Medical): No    Lack of Transportation (Non-Medical): No  Physical Activity: Sufficiently Active (12/30/2023)   Exercise Vital Sign    Days of Exercise per Week: 3 days    Minutes of Exercise per Session: 50 min  Stress: No Stress Concern Present (12/30/2023)   [employer] of Occupational Health - Occupational Stress Questionnaire    Feeling of Stress: Only a little  Social Connections: Moderately Integrated (12/30/2023)   Social Connection and Isolation Panel    Frequency of Communication with Friends and Family: Twice a week    Frequency of Social Gatherings with Friends and Family: Twice a week    Attends Religious Services: More than 4 times per year    Active Member of [employer] or Organizations: No    Attends Banker  Meetings: Not on file    Marital Status: Married  Catering Manager Violence: Not At Risk (08/14/2022)   Received from [facility]   HITS    Over the last 12 months how often did your partner physically hurt you?: Never    Over the last 12 months how often did your partner insult you or talk down to you?: Never    Over the last 12 months how often did your partner threaten you with physical harm?: Never    Over the last 12 months how often did your partner scream or curse at you?: Never   Family History  Problem Relation Age of Onset   Cancer Mother    Varicose Veins Mother    Cancer Father    Stroke Father    Hypertension Father    Colon cancer Father    Current Outpatient Medications on File Prior to Visit  Medication Sig   diclofenac  Sodium (VOLTAREN ) 1 % GEL Apply 1 Application topically daily as needed (pain).   fluticasone  (FLONASE ) 50 MCG/ACT nasal spray PLACE 2 SPRAYS IN BOTH NOSTRILS DAILY FOR 4-6 WEEKS THEN STOP AND USE SEASONALLY OR AS NEEDED.   lidocaine  (LIDODERM ) 5 % Place 1-2 patches onto the skin daily as needed (back pain).   loratadine (CLARITIN) 10 MG tablet Take 10 mg by mouth daily as needed for allergies.   testosterone  cypionate (DEPOTESTOSTERONE CYPIONATE) 200 MG/ML injection INJECT 1 ML INTO THE MUSCLE EVERY 7 DAYS   No current facility-administered medications on file prior to visit.    Review of Systems  Constitutional:  Negative for activity change, appetite change, chills, diaphoresis, fatigue and fever.  HENT:  Negative for congestion and hearing loss.   Eyes:  Negative for visual disturbance.  Respiratory:  Negative for cough, chest tightness, shortness of breath and wheezing.   Cardiovascular:  Negative for chest pain, palpitations and leg swelling.  Gastrointestinal:  Negative for abdominal pain, constipation, diarrhea, nausea and vomiting.  Genitourinary:  Negative for dysuria, frequency and hematuria.  Musculoskeletal:  Negative for arthralgias  and neck pain.  Skin:  Negative for rash.  Neurological:  Negative for dizziness, weakness, light-headedness, numbness and headaches.  Hematological:  Negative for adenopathy.  Psychiatric/Behavioral:  Negative for behavioral problems,  dysphoric mood and sleep disturbance.    Per HPI unless specifically indicated above     Objective:    BP 138/86 (BP Location: Left Arm, Cuff Size: Normal)   Pulse 98   Ht 6' 1 (1.854 m)   Wt 197 lb 4 oz (89.5 kg)   SpO2 98%   BMI 26.02 kg/m   Wt Readings from Last 3 Encounters:  01/18/24 197 lb 4 oz (89.5 kg)  11/26/23 200 lb (90.7 kg)  07/08/23 195 lb (88.5 kg)    Physical Exam Vitals and nursing note reviewed.  Constitutional:      General: He is not in acute distress.    Appearance: He is well-developed. He is not diaphoretic.     Comments: Well-appearing, comfortable, cooperative  HENT:     Head: Normocephalic and atraumatic.  Eyes:     General:        Right eye: No discharge.        Left eye: No discharge.     Conjunctiva/sclera: Conjunctivae normal.     Pupils: Pupils are equal, round, and reactive to light.  Neck:     Thyroid: No thyromegaly.  Cardiovascular:     Rate and Rhythm: Normal rate and regular rhythm.     Pulses: Normal pulses.     Heart sounds: Normal heart sounds. No murmur heard. Pulmonary:     Effort: Pulmonary effort is normal. No respiratory distress.     Breath sounds: Normal breath sounds. No wheezing or rales.  Abdominal:     General: Bowel sounds are normal. There is no distension.     Palpations: Abdomen is soft. There is no mass.     Tenderness: There is no abdominal tenderness.  Musculoskeletal:        General: No tenderness. Normal range of motion.     Cervical back: Normal range of motion and neck supple.     Right lower leg: No edema.     Left lower leg: No edema.     Comments: Upper / Lower Extremities: - Normal muscle tone, strength bilateral upper extremities 5/5, lower extremities 5/5   Lymphadenopathy:     Cervical: No cervical adenopathy.  Skin:    General: Skin is warm and dry.     Findings: No erythema or rash.  Neurological:     Mental Status: He is alert and oriented to person, place, and time.     Comments: Distal sensation intact to light touch all extremities  Psychiatric:        Mood and Affect: Mood normal.        Behavior: Behavior normal.        Thought Content: Thought content normal.     Comments: Well groomed, good eye contact, normal speech and thoughts     Results for orders placed or performed in visit on 12/31/23  Lipid panel   Collection Time: 01/01/24  8:41 AM  Result Value Ref Range   Cholesterol 154 <200 mg/dL   HDL 60 > OR = 40 mg/dL   Triglycerides 52 <849 mg/dL   LDL Cholesterol (Calc) 81 mg/dL (calc)   Total CHOL/HDL Ratio 2.6 <5.0 (calc)   Non-HDL Cholesterol (Calc) 94 <869 mg/dL (calc)  Hemoglobin J8r   Collection Time: 01/01/24  8:41 AM  Result Value Ref Range   Hgb A1c MFr Bld 5.0 <5.7 %   Mean Plasma Glucose 97 mg/dL   eAG (mmol/L) 5.4 mmol/L  CBC with Differential/Platelet   Collection Time: 01/01/24  8:41  AM  Result Value Ref Range   WBC 7.3 3.8 - 10.8 Thousand/uL   RBC 5.09 4.20 - 5.80 Million/uL   Hemoglobin 15.6 13.2 - 17.1 g/dL   HCT 52.8 61.4 - 49.9 %   MCV 92.5 80.0 - 100.0 fL   MCH 30.6 27.0 - 33.0 pg   MCHC 33.1 32.0 - 36.0 g/dL   RDW 88.1 88.9 - 84.9 %   Platelets 181 140 - 400 Thousand/uL   MPV 11.0 7.5 - 12.5 fL   Neutro Abs 4,490 1,500 - 7,800 cells/uL   Absolute Lymphocytes 2,000 850 - 3,900 cells/uL   Absolute Monocytes 759 200 - 950 cells/uL   Eosinophils Absolute 22 15 - 500 cells/uL   Basophils Absolute 29 0 - 200 cells/uL   Neutrophils Relative % 61.5 %   Total Lymphocyte 27.4 %   Monocytes Relative 10.4 %   Eosinophils Relative 0.3 %   Basophils Relative 0.4 %  PSA   Collection Time: 01/01/24  8:41 AM  Result Value Ref Range   PSA 0.71 < OR = 4.00 ng/mL  TSH   Collection Time:  01/01/24  8:41 AM  Result Value Ref Range   TSH 0.82 0.40 - 4.50 mIU/L  Comprehensive metabolic panel with GFR   Collection Time: 01/01/24  8:41 AM  Result Value Ref Range   Glucose, Bld 98 65 - 99 mg/dL   BUN 15 7 - 25 mg/dL   Creat 9.14 9.29 - 8.69 mg/dL   eGFR 897 > OR = 60 fO/fpw/8.26f7   BUN/Creatinine Ratio SEE NOTE: 6 - 22 (calc)   Sodium 142 135 - 146 mmol/L   Potassium 3.8 3.5 - 5.3 mmol/L   Chloride 106 98 - 110 mmol/L   CO2 25 20 - 32 mmol/L   Calcium 9.4 8.6 - 10.3 mg/dL   Total Protein 6.2 6.1 - 8.1 g/dL   Albumin 4.4 3.6 - 5.1 g/dL   Globulin 1.8 (L) 1.9 - 3.7 g/dL (calc)   AG Ratio 2.4 1.0 - 2.5 (calc)   Total Bilirubin 0.6 0.2 - 1.2 mg/dL   Alkaline phosphatase (APISO) 57 35 - 144 U/L   AST 17 10 - 35 U/L   ALT 16 9 - 46 U/L      Assessment & Plan:   Problem List Items Addressed This Visit     BPH associated with nocturia   Carpal tunnel syndrome   Relevant Medications   rOPINIRole  (REQUIP ) 0.5 MG tablet   Chronic pain of both knees   Relevant Medications   Oxycodone  HCl 10 MG TABS   DDD (degenerative disc disease), lumbar   Relevant Medications   Oxycodone  HCl 10 MG TABS   GAD (generalized anxiety disorder)   Hypertension   Relevant Medications   lisinopril  (ZESTRIL ) 30 MG tablet   Major depressive disorder, recurrent, in remission   Other Visit Diagnoses       Annual physical exam    -  Primary     Essential hypertension       Relevant Medications   lisinopril  (ZESTRIL ) 30 MG tablet     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 Declined flu and COVID vaccinations. Discussed pneumonia and shingles vaccinations. Lab results normal. Cholesterol well-controlled. Weight loss due to diet and exercise. - Recommended pneumonia and shingles vaccinations post-surgery. - Continue current diet and exercise regimen.  Chronic pain syndrome with opioid dependence Chronic pain management complicated by opioid dependence. Previously on Oxycodone  20mg  6 times per day by pain management but no longer established with that practice, previous doctor retired and new practice took over he prefers to no longer go there and it is too far away. He has been searching for other pain management location nearby in network I have agreed to manage his opioid therapy at a lower dose, half dose 10mg  up to 6 per day. Current oxycodone  dose insufficient. Discussed pain management challenges and specialist options. - Prescribed 30-day supply of oxycodone  10 mg. Previously was 7 day quantity, will agree to 30 day quantity - Provided list of potential pain management specialists. - Scheduled follow-up in 30 days for pain management update. If need we can write an opioid contract but I advised his pain is more complex and is better suited to dedicated pain specialist.  Degenerative joint disease of left knee, post-total knee replacement (planned) Total knee replacement scheduled for December 9th. Discussed post-operative pain management and vaccination timing. - Proceed with pre-operative testing.  Degenerative disc disease with multilevel bulging discs and lumbar radiculopathy Chronic back pain due to degenerative disc disease and multilevel bulging discs. Pain exacerbated by physical activity. - Continue use of ice and heat for symptom management.  Cervical spinal stenosis No acute changes reported.  Restless legs syndrome Symptoms possibly exacerbated by gabapentin   withdrawal. Discussed ropinirole for management. Gabapentin  discontinued due to side effects. - Prescribed ropinirole 0.5 mg at bedtime for restless legs syndrome.  Essential hypertension Blood pressure well-controlled with lisinopril . - Switched lisinopril  prescription to 90-day supply.  Benign prostatic hyperplasia with lower urinary tract symptoms PSA levels within normal range.  Generalized anxiety disorder and depression No acute changes reported.        No orders of the defined types were placed in this encounter.   Meds ordered this encounter  Medications   lisinopril  (ZESTRIL ) 30 MG tablet    Sig: Take 1 tablet (30 mg total) by mouth daily.    Dispense:  90 tablet    Refill:  3    Add refills   Oxycodone  HCl 10 MG TABS    Sig: Take 1 tablet (10 mg total) by mouth every 4 (four) hours as needed (pain).    Dispense:  180 tablet    Refill:  0    30 day supply   rOPINIRole (REQUIP) 0.5 MG tablet    Sig: Take 1 tablet (0.5 mg total) by mouth at bedtime.    Dispense:  30 tablet    Refill:  2     Follow up plan: Return in about 4 weeks (around 02/15/2024) for 1 month follow-up Pain Management update.  Marsa Officer, DO [facility] 01/18/2024, 2:26 PM

## 2024-01-20 ENCOUNTER — Inpatient Hospital Stay
Admission: RE | Admit: 2024-01-20 | Discharge: 2024-01-20 | Disposition: A | Source: Ambulatory Visit | Attending: Surgery

## 2024-01-20 ENCOUNTER — Other Ambulatory Visit: Payer: Self-pay

## 2024-01-20 VITALS — BP 142/88 | HR 92 | Temp 97.8°F | Resp 18 | Ht 72.0 in | Wt 195.0 lb

## 2024-01-20 DIAGNOSIS — I10 Essential (primary) hypertension: Secondary | ICD-10-CM | POA: Diagnosis not present

## 2024-01-20 DIAGNOSIS — Z01812 Encounter for preprocedural laboratory examination: Secondary | ICD-10-CM | POA: Diagnosis not present

## 2024-01-20 HISTORY — DX: Depression, unspecified: F32.A

## 2024-01-20 HISTORY — DX: Unspecified osteoarthritis, unspecified site: M19.90

## 2024-01-20 LAB — CBC WITH DIFFERENTIAL/PLATELET
Abs Immature Granulocytes: 0.02 K/uL (ref 0.00–0.07)
Basophils Absolute: 0 K/uL (ref 0.0–0.1)
Basophils Relative: 1 %
Eosinophils Absolute: 0.1 K/uL (ref 0.0–0.5)
Eosinophils Relative: 1 %
HCT: 46.2 % (ref 39.0–52.0)
Hemoglobin: 15.5 g/dL (ref 13.0–17.0)
Immature Granulocytes: 0 %
Lymphocytes Relative: 20 %
Lymphs Abs: 1.5 K/uL (ref 0.7–4.0)
MCH: 30.3 pg (ref 26.0–34.0)
MCHC: 33.5 g/dL (ref 30.0–36.0)
MCV: 90.4 fL (ref 80.0–100.0)
Monocytes Absolute: 0.7 K/uL (ref 0.1–1.0)
Monocytes Relative: 10 %
Neutro Abs: 4.9 K/uL (ref 1.7–7.7)
Neutrophils Relative %: 68 %
Platelets: 187 K/uL (ref 150–400)
RBC: 5.11 MIL/uL (ref 4.22–5.81)
RDW: 11.7 % (ref 11.5–15.5)
WBC: 7.2 K/uL (ref 4.0–10.5)
nRBC: 0 % (ref 0.0–0.2)

## 2024-01-20 LAB — URINALYSIS, ROUTINE W REFLEX MICROSCOPIC
Bilirubin Urine: NEGATIVE
Glucose, UA: NEGATIVE mg/dL
Hgb urine dipstick: NEGATIVE
Ketones, ur: NEGATIVE mg/dL
Leukocytes,Ua: NEGATIVE
Nitrite: NEGATIVE
Protein, ur: NEGATIVE mg/dL
Specific Gravity, Urine: 1.019 (ref 1.005–1.030)
pH: 5 (ref 5.0–8.0)

## 2024-01-20 LAB — COMPREHENSIVE METABOLIC PANEL WITH GFR
ALT: 17 U/L (ref 0–44)
AST: 18 U/L (ref 15–41)
Albumin: 4.2 g/dL (ref 3.5–5.0)
Alkaline Phosphatase: 73 U/L (ref 38–126)
Anion gap: 7 (ref 5–15)
BUN: 18 mg/dL (ref 6–20)
CO2: 29 mmol/L (ref 22–32)
Calcium: 8.7 mg/dL — ABNORMAL LOW (ref 8.9–10.3)
Chloride: 105 mmol/L (ref 98–111)
Creatinine, Ser: 1 mg/dL (ref 0.61–1.24)
GFR, Estimated: >60 mL/min (ref >60)
Glucose, Bld: 81 mg/dL (ref 70–99)
Potassium: 4.1 mmol/L (ref 3.5–5.1)
Sodium: 141 mmol/L (ref 135–145)
Total Bilirubin: 0.6 mg/dL (ref 0.0–1.2)
Total Protein: 6.2 g/dL — ABNORMAL LOW (ref 6.5–8.1)

## 2024-01-20 LAB — SURGICAL PCR SCREEN
MRSA, PCR: NEGATIVE
Staphylococcus aureus: NEGATIVE

## 2024-01-20 NOTE — Patient Instructions (Addendum)
 Your procedure is scheduled on:  TUESDAY DECEMBER 9  Report to the Registration Desk on the 1st floor of the [facility] To find out your arrival time, please call [phone] between 1PM - 3PM on:  MONDAY DECEMBER 8  If your arrival time is 6:00 am, do not arrive before that time as the [facility] entrance doors do not open until 6:00 am.  REMEMBER: Instructions that are not followed completely may result in serious medical risk, up to and including death; or upon the discretion of your surgeon and anesthesiologist your surgery may need to be rescheduled.  Do not eat food after midnight the night before surgery.  No gum chewing or hard candies.  You may however, drink CLEAR liquids up to 2 hours before you are scheduled to arrive for your surgery. Do not drink anything within 2 hours of your scheduled arrival time.  Clear liquids include: - water   - apple juice without pulp - gatorade (not RED colors) - black coffee or tea (Do NOT add milk or creamers to the coffee or tea) Do NOT drink anything that is not on this list.    In addition, your doctor has ordered for you to drink the provided:  Ensure Pre-Surgery Clear Carbohydrate Drink  Drinking this carbohydrate drink up to two hours before surgery helps to reduce insulin resistance and improve patient outcomes. Please complete drinking 2 hours before scheduled arrival time.  One week prior to surgery: STARTING TUESDAY DECEMBER 2  Stop Anti-inflammatories (NSAIDS) such as Advil , Aleve , Ibuprofen , Motrin , Naproxen , Naprosyn  and Aspirin based products such as Excedrin, Goody's Powder, BC Powder. Stop ANY OVER THE COUNTER supplements until after surgery. fluticasone  (FLONASE )   You may however, continue to take Tylenol  if needed for pain up until the day of surgery.  Continue taking all of your other prescription medications up until the day of surgery.  ON THE MORNING OF SURGERY DO NOT TAKE ANY MEDICATIONS  No Alcohol for 24  hours before or after surgery.  Do not use any recreational drugs for at least a week (preferably 2 weeks) before your surgery.  Please be advised that the combination of cocaine and anesthesia may have negative outcomes, up to and including death. If you test positive for cocaine, your surgery will be cancelled.  On the morning of surgery brush your teeth with toothpaste and water , you may rinse your mouth with mouthwash if you wish. Do not swallow any toothpaste or mouthwash.  Use CHG Soap as directed on instruction sheet.  Do not wear jewelry, make-up, hairpins, clips or nail polish.  For welded (permanent) jewelry: bracelets, anklets, waist bands, etc.  Please have this removed prior to surgery.  If it is not removed, there is a chance that hospital personnel will need to cut it off on the day of surgery.  Do not wear lotions, powders, or perfumes.   Do not shave body hair from the neck down 48 hours before surgery.  Do not bring valuables to the hospital. [facility] is not responsible for any missing/lost belongings or valuables.   Notify your doctor if there is any change in your medical condition (cold, fever, infection).  Wear comfortable clothing (specific to your surgery type) to the hospital.  After surgery, you can help prevent lung complications by doing breathing exercises.  Take deep breaths and cough every 1-2 hours. Your doctor may order a device called an Incentive Spirometer to help you take deep breaths.  If you are  being admitted to the hospital overnight, leave your suitcase in the car. After surgery it may be brought to your room.  In case of increased patient census, it may be necessary for you, the patient, to continue your postoperative care in the Same Day Surgery department.  If you are being discharged the day of surgery, you will not be allowed to drive home. You will need a responsible individual to drive you home and stay with you for 24 hours after  surgery.   If you are taking public transportation, you will need to have a responsible individual with you.  Please call the Pre-admissions Testing Dept. at [phone] if you have any questions about these instructions.  Surgery Visitation Policy:  Patients having surgery or a procedure may have two visitors.  Children under the age of 64 must have an adult with them who is not the patient.  Inpatient Visitation:    Visiting hours are 7 a.m. to 8 p.m. Up to four visitors are allowed at one time in a patient room. The visitors may rotate out with other people during the day.  One visitor age 6 or older may stay with the patient overnight and must be in the room by 8 p.m.   Merchandiser, Retail to address health-related social needs:  [email or web address]     Pre-operative 4 CHG Bath Instructions   You can play a key role in reducing the risk of infection after surgery. Your skin needs to be as free of germs as possible. You can reduce the number of germs on your skin by washing with CHG (chlorhexidine  gluconate) soap before surgery. CHG is an antiseptic soap that kills germs and continues to kill germs even after washing.   DO NOT use if you have an allergy to chlorhexidine /CHG or antibacterial soaps. If your skin becomes reddened or irritated, stop using the CHG and notify one of our RNs at [phone].   Please shower with the CHG soap starting 4 days before surgery using the following schedule:  STARTING FRIDAY DECEMBER 5     Please keep in mind the following:  DO NOT shave, including legs and underarms, starting the day of your first shower.   You may shave your face at any point before/day of surgery.  Place clean sheets on your bed the day you start using CHG soap. Use a clean washcloth (not used since being washed) for each shower. DO NOT sleep with pets once you start using the CHG.   CHG Shower Instructions:  If you choose to wash your hair  and private area, wash first with your normal shampoo/soap.  After you use shampoo/soap, rinse your hair and body thoroughly to remove shampoo/soap residue.  Turn the water  OFF and apply about 3 tablespoons (45 ml) of CHG soap to a CLEAN washcloth.  Apply CHG soap ONLY FROM YOUR NECK DOWN TO YOUR TOES (washing for 3-5 minutes)  DO NOT use CHG soap on face, private areas, open wounds, or sores.  Pay special attention to the area where your surgery is being performed.  If you are having back surgery, having someone wash your back for you may be helpful. Wait 2 minutes after CHG soap is applied, then you may rinse off the CHG soap.  Pat dry with a clean towel  Put on clean clothes/pajamas   If you choose to wear lotion, please use ONLY the CHG-compatible lotions on the back of this paper.     Additional instructions for  the day of surgery: DO NOT APPLY any lotions, deodorants, cologne, or perfumes.   Put on clean/comfortable clothes.  Brush your teeth.  Ask your nurse before applying any prescription medications to the skin.      CHG Compatible Lotions   Aveeno Moisturizing lotion  Cetaphil Moisturizing Cream  Cetaphil Moisturizing Lotion  Clairol Herbal Essence Moisturizing Lotion, Dry Skin  Clairol Herbal Essence Moisturizing Lotion, Extra Dry Skin  Clairol Herbal Essence Moisturizing Lotion, Normal Skin  Curel Age Defying Therapeutic Moisturizing Lotion with Alpha Hydroxy  Curel Extreme Care Body Lotion  Curel Soothing Hands Moisturizing Hand Lotion  Curel Therapeutic Moisturizing Cream, Fragrance-Free  Curel Therapeutic Moisturizing Lotion, Fragrance-Free  Curel Therapeutic Moisturizing Lotion, Original Formula  Eucerin Daily Replenishing Lotion  Eucerin Dry Skin Therapy Plus Alpha Hydroxy Crme  Eucerin Dry Skin Therapy Plus Alpha Hydroxy Lotion  Eucerin Original Crme  Eucerin Original Lotion  Eucerin Plus Crme Eucerin Plus Lotion  Eucerin TriLipid Replenishing Lotion   Keri Anti-Bacterial Hand Lotion  Keri Deep Conditioning Original Lotion Dry Skin Formula Softly Scented  Keri Deep Conditioning Original Lotion, Fragrance Free Sensitive Skin Formula  Keri Lotion Fast Absorbing Fragrance Free Sensitive Skin Formula  Keri Lotion Fast Absorbing Softly Scented Dry Skin Formula  Keri Original Lotion  Keri Skin Renewal Lotion Keri Silky Smooth Lotion  Keri Silky Smooth Sensitive Skin Lotion  Nivea Body Creamy Conditioning Oil  Nivea Body Extra Enriched Lotion  Nivea Body Original Lotion  Nivea Body Sheer Moisturizing Lotion Nivea Crme  Nivea Skin Firming Lotion  NutraDerm 30 Skin Lotion  NutraDerm Skin Lotion  NutraDerm Therapeutic Skin Cream  NutraDerm Therapeutic Skin Lotion  ProShield Protective Hand Cream  Provon moisturizing lotion   How to Use an Incentive Spirometer  An incentive spirometer is a tool that measures how well you are filling your lungs with each breath. Learning to take long, deep breaths using this tool can help you keep your lungs clear and active. This may help to reverse or lessen your chance of developing breathing (pulmonary) problems, especially infection. You may be asked to use a spirometer: After a surgery. If you have a lung problem or a history of smoking. After a long period of time when you have been unable to move or be active. If the spirometer includes an indicator to show the highest number that you have reached, your health care provider or respiratory therapist will help you set a goal. Keep a log of your progress as told by your health care provider. What are the risks? Breathing too quickly may cause dizziness or cause you to pass out. Take your time so you do not get dizzy or light-headed. If you are in pain, you may need to take pain medicine before doing incentive spirometry. It is harder to take a deep breath if you are having pain. How to use your incentive spirometer  Sit up on the edge of your bed or on a  chair. Hold the incentive spirometer so that it is in an upright position. Before you use the spirometer, breathe out normally. Place the mouthpiece in your mouth. Make sure your lips are closed tightly around it. Breathe in slowly and as deeply as you can through your mouth, causing the piston or the ball to rise toward the top of the chamber. Hold your breath for 3-5 seconds, or for as long as possible. If the spirometer includes a coach indicator, use this to guide you in breathing. Slow down your breathing if  the indicator goes above the marked areas. Remove the mouthpiece from your mouth and breathe out normally. The piston or ball will return to the bottom of the chamber. Rest for a few seconds, then repeat the steps 10 or more times. Take your time and take a few normal breaths between deep breaths so that you do not get dizzy or light-headed. Do this every 1-2 hours when you are awake. If the spirometer includes a goal marker to show the highest number you have reached (best effort), use this as a goal to work toward during each repetition. After each set of 10 deep breaths, cough a few times. This will help to make sure that your lungs are clear. If you have an incision on your chest or abdomen from surgery, place a pillow or a rolled-up towel firmly against the incision when you cough. This can help to reduce pain while taking deep breaths and coughing. General tips When you are able to get out of bed: Walk around often. Continue to take deep breaths and cough in order to clear your lungs. Keep using the incentive spirometer until your health care provider says it is okay to stop using it. If you have been in the hospital, you may be told to keep using the spirometer at home. Contact a health care provider if: You are having difficulty using the spirometer. You have trouble using the spirometer as often as instructed. Your pain medicine is not giving enough relief for you to use the  spirometer as told. You have a fever. Get help right away if: You develop shortness of breath. You develop a cough with bloody mucus from the lungs. You have fluid or blood coming from an incision site after you cough. Summary An incentive spirometer is a tool that can help you learn to take long, deep breaths to keep your lungs clear and active. You may be asked to use a spirometer after a surgery, if you have a lung problem or a history of smoking, or if you have been inactive for a long period of time. Use your incentive spirometer as instructed every 1-2 hours while you are awake. If you have an incision on your chest or abdomen, place a pillow or a rolled-up towel firmly against your incision when you cough. This will help to reduce pain. Get help right away if you have shortness of breath, you cough up bloody mucus, or blood comes from your incision when you cough. This information is not intended to replace advice given to you by your health care provider. Make sure you discuss any questions you have with your health care provider. Document Revised: 04/25/2019 Document Reviewed: 04/25/2019 Elsevier Patient Education  2023 Elsevier Inc.         Preoperative Educational Videos for Total Hip, Knee and Shoulder Replacements  To better prepare for surgery, please view our videos that explain the physical activity and discharge planning required to have the best surgical recovery at [facility].  [email or web address]  Questions? Call [phone] or email [email or web address]

## 2024-01-26 ENCOUNTER — Encounter: Payer: Self-pay | Admitting: Surgery

## 2024-01-26 ENCOUNTER — Encounter
Admission: RE | Disposition: A | Discharge status: Home / Self Care | Payer: Self-pay | Source: Home / Self Care | Attending: Surgery

## 2024-01-26 ENCOUNTER — Ambulatory Visit
Admission: RE | Admit: 2024-01-26 | Discharge: 2024-01-26 | Disposition: A | Discharge status: Home / Self Care | Attending: Surgery | Admitting: Surgery

## 2024-01-26 ENCOUNTER — Other Ambulatory Visit: Payer: Self-pay

## 2024-01-26 ENCOUNTER — Ambulatory Visit

## 2024-01-26 HISTORY — PX: TOTAL KNEE ARTHROPLASTY: SHX125

## 2024-01-26 SURGERY — ARTHROPLASTY, KNEE, TOTAL
Anesthesia: Spinal | Site: Knee | Laterality: Left

## 2024-01-26 MED ORDER — LACTATED RINGERS IV SOLN: INTRAVENOUS | Status: DC

## 2024-01-26 MED ORDER — KETOROLAC TROMETHAMINE 30 MG/ML IJ SOLN
INTRAMUSCULAR | Status: DC | PRN
  Administered 2024-01-26: 30 mg via INTRAMUSCULAR

## 2024-01-26 MED ORDER — CEFAZOLIN SODIUM-DEXTROSE 2-4 GM/100ML-% IV SOLN: 2.0000 g | Freq: Four times a day (QID) | INTRAVENOUS | Status: DC

## 2024-01-26 MED ORDER — MIDAZOLAM HCL 5 MG/5ML IJ SOLN
INTRAMUSCULAR | Status: DC | PRN
  Administered 2024-01-26 (×2): 2 mg via INTRAVENOUS

## 2024-01-26 MED ORDER — PROPOFOL 10 MG/ML IV BOLUS
INTRAVENOUS | Status: DC | PRN
  Administered 2024-01-26: 30 mg via INTRAVENOUS

## 2024-01-26 MED ORDER — KETOROLAC TROMETHAMINE 15 MG/ML IJ SOLN
INTRAMUSCULAR | Status: AC
  Filled 2024-01-26: qty 1

## 2024-01-26 MED ORDER — PROPOFOL 10 MG/ML IV BOLUS
INTRAVENOUS | Status: AC
  Filled 2024-01-26: qty 80

## 2024-01-26 MED ORDER — ROCURONIUM BROMIDE 10 MG/ML (PF) SYRINGE
PREFILLED_SYRINGE | INTRAVENOUS | Status: AC
  Filled 2024-01-26: qty 10

## 2024-01-26 MED ORDER — SODIUM CHLORIDE 0.9 % IR SOLN
Status: DC | PRN
  Administered 2024-01-26: 3000 mL

## 2024-01-26 MED ORDER — BUPIVACAINE HCL (PF) 0.5 % IJ SOLN
INTRAMUSCULAR | Status: DC | PRN
  Administered 2024-01-26: 3 mL

## 2024-01-26 MED ORDER — MIDAZOLAM HCL 2 MG/2ML IJ SOLN
INTRAMUSCULAR | Status: AC
  Filled 2024-01-26: qty 2

## 2024-01-26 MED ORDER — BUPIVACAINE-EPINEPHRINE (PF) 0.5% -1:200000 IJ SOLN
INTRAMUSCULAR | Status: AC
  Filled 2024-01-26: qty 30

## 2024-01-26 MED ORDER — METOCLOPRAMIDE HCL 10 MG PO TABS: 5.0000 mg | ORAL_TABLET | Freq: Three times a day (TID) | ORAL | Status: DC | PRN

## 2024-01-26 MED ORDER — BUPIVACAINE LIPOSOME 1.3 % IJ SUSP
INTRAMUSCULAR | Status: AC
  Filled 2024-01-26: qty 20

## 2024-01-26 MED ORDER — ORAL CARE MOUTH RINSE: 15.0000 mL | Freq: Once | OROMUCOSAL | Status: AC

## 2024-01-26 MED ORDER — ONDANSETRON HCL 4 MG/2ML IJ SOLN
INTRAMUSCULAR | Status: DC | PRN
  Administered 2024-01-26: 4 mg via INTRAVENOUS

## 2024-01-26 MED ORDER — TRIAMCINOLONE ACETONIDE 40 MG/ML IJ SUSP
INTRAMUSCULAR | Status: DC | PRN
  Administered 2024-01-26: 80 mg via INTRAMUSCULAR

## 2024-01-26 MED ORDER — FENTANYL CITRATE (PF) 100 MCG/2ML IJ SOLN
INTRAMUSCULAR | Status: AC
  Filled 2024-01-26: qty 2

## 2024-01-26 MED ORDER — FENTANYL CITRATE (PF) 100 MCG/2ML IJ SOLN
INTRAMUSCULAR | Status: DC | PRN
  Administered 2024-01-26 (×4): 50 ug via INTRAVENOUS

## 2024-01-26 MED ORDER — SEVOFLURANE IN SOLN
RESPIRATORY_TRACT | Status: AC
  Filled 2024-01-26: qty 250

## 2024-01-26 MED ORDER — PROPOFOL 1000 MG/100ML IV EMUL
INTRAVENOUS | Status: AC
  Filled 2024-01-26: qty 100

## 2024-01-26 MED ORDER — CHLORHEXIDINE GLUCONATE 0.12 % MT SOLN
15.0000 mL | Freq: Once | OROMUCOSAL | Status: AC
  Administered 2024-01-26: 15 mL via OROMUCOSAL

## 2024-01-26 MED ORDER — DEXAMETHASONE SOD PHOSPHATE PF 10 MG/ML IJ SOLN
INTRAMUSCULAR | Status: DC | PRN
  Administered 2024-01-26: 10 mg via INTRAVENOUS

## 2024-01-26 MED ORDER — BUPIVACAINE-EPINEPHRINE (PF) 0.5% -1:200000 IJ SOLN
INTRAMUSCULAR | Status: DC | PRN
  Administered 2024-01-26: 30 mL

## 2024-01-26 MED ORDER — PHENYLEPHRINE 80 MCG/ML (10ML) SYRINGE FOR IV PUSH (FOR BLOOD PRESSURE SUPPORT)
PREFILLED_SYRINGE | INTRAVENOUS | Status: AC
  Filled 2024-01-26: qty 10

## 2024-01-26 MED ORDER — ONDANSETRON HCL 4 MG/2ML IJ SOLN
INTRAMUSCULAR | Status: AC
  Filled 2024-01-26: qty 2

## 2024-01-26 MED ORDER — OXYCODONE HCL 5 MG PO TABS
ORAL_TABLET | ORAL | Status: AC
  Filled 2024-01-26: qty 3

## 2024-01-26 MED ORDER — APIXABAN 2.5 MG PO TABS: 2.5000 mg | ORAL_TABLET | Freq: Two times a day (BID) | ORAL | 0 refills | Status: AC

## 2024-01-26 MED ORDER — DEXMEDETOMIDINE HCL IN NACL 80 MCG/20ML IV SOLN
INTRAVENOUS | Status: DC | PRN
  Administered 2024-01-26: 12 ug via INTRAVENOUS

## 2024-01-26 MED ORDER — DROPERIDOL 2.5 MG/ML IJ SOLN: 0.6250 mg | Freq: Once | INTRAMUSCULAR | Status: DC | PRN

## 2024-01-26 MED ORDER — ONDANSETRON HCL 4 MG/2ML IJ SOLN: 4.0000 mg | Freq: Four times a day (QID) | INTRAMUSCULAR | Status: DC | PRN

## 2024-01-26 MED ORDER — LIDOCAINE HCL (PF) 2 % IJ SOLN
INTRAMUSCULAR | Status: AC
  Filled 2024-01-26: qty 5

## 2024-01-26 MED ORDER — METOCLOPRAMIDE HCL 5 MG/ML IJ SOLN: 5.0000 mg | Freq: Three times a day (TID) | INTRAMUSCULAR | Status: DC | PRN

## 2024-01-26 MED ORDER — KETOROLAC TROMETHAMINE 30 MG/ML IJ SOLN
INTRAMUSCULAR | Status: AC
  Filled 2024-01-26: qty 1

## 2024-01-26 MED ORDER — SUCCINYLCHOLINE CHLORIDE 200 MG/10ML IV SOSY
PREFILLED_SYRINGE | INTRAVENOUS | Status: AC
  Filled 2024-01-26: qty 10

## 2024-01-26 MED ORDER — SODIUM CHLORIDE 0.9 % BOLUS PEDS
250.0000 mL | Freq: Once | INTRAVENOUS | Status: AC
  Administered 2024-01-26: 250 mL via INTRAVENOUS

## 2024-01-26 MED ORDER — EPHEDRINE 5 MG/ML INJ
INTRAVENOUS | Status: AC
  Filled 2024-01-26: qty 5

## 2024-01-26 MED ORDER — PROPOFOL 500 MG/50ML IV EMUL
INTRAVENOUS | Status: DC | PRN
  Administered 2024-01-26: 100 ug/kg/min via INTRAVENOUS

## 2024-01-26 MED ORDER — FENTANYL CITRATE (PF) 100 MCG/2ML IJ SOLN
25.0000 ug | INTRAMUSCULAR | Status: DC | PRN
  Administered 2024-01-26 (×3): 50 ug via INTRAVENOUS

## 2024-01-26 MED ORDER — CHLORHEXIDINE GLUCONATE 0.12 % MT SOLN
OROMUCOSAL | Status: AC
  Filled 2024-01-26: qty 15

## 2024-01-26 MED ORDER — ONDANSETRON HCL 4 MG PO TABS: 4.0000 mg | ORAL_TABLET | Freq: Four times a day (QID) | ORAL | Status: DC | PRN

## 2024-01-26 MED ORDER — SODIUM CHLORIDE 0.9 % IV SOLN
INTRAVENOUS | Status: DC | PRN
  Administered 2024-01-26: 60 mL

## 2024-01-26 MED ORDER — TRANEXAMIC ACID-NACL 1000-0.7 MG/100ML-% IV SOLN
1000.0000 mg | INTRAVENOUS | Status: AC
  Administered 2024-01-26 (×2): 1000 mg via INTRAVENOUS

## 2024-01-26 MED ORDER — CEFAZOLIN SODIUM-DEXTROSE 2-4 GM/100ML-% IV SOLN
2.0000 g | Freq: Four times a day (QID) | INTRAVENOUS | Status: AC
  Administered 2024-01-26: 2 g via INTRAVENOUS

## 2024-01-26 MED ORDER — TRANEXAMIC ACID-NACL 1000-0.7 MG/100ML-% IV SOLN
INTRAVENOUS | Status: AC
  Filled 2024-01-26: qty 100

## 2024-01-26 MED ORDER — CEFAZOLIN SODIUM-DEXTROSE 2-4 GM/100ML-% IV SOLN
INTRAVENOUS | Status: AC
  Filled 2024-01-26: qty 100

## 2024-01-26 MED ORDER — SODIUM CHLORIDE (PF) 0.9 % IJ SOLN
INTRAMUSCULAR | Status: AC
  Filled 2024-01-26: qty 40

## 2024-01-26 MED ORDER — OXYCODONE HCL 5 MG PO TABS
10.0000 mg | ORAL_TABLET | ORAL | Status: DC | PRN
  Administered 2024-01-26 (×2): 15 mg via ORAL

## 2024-01-26 MED ORDER — PHENYLEPHRINE HCL-NACL 20-0.9 MG/250ML-% IV SOLN
INTRAVENOUS | Status: AC
  Filled 2024-01-26: qty 250

## 2024-01-26 MED ORDER — SODIUM CHLORIDE 0.9 % IV SOLN: INTRAVENOUS | Status: DC

## 2024-01-26 MED ORDER — 0.9 % SODIUM CHLORIDE (POUR BTL) OPTIME
TOPICAL | Status: DC | PRN
  Administered 2024-01-26: 500 mL

## 2024-01-26 MED ORDER — KETOROLAC TROMETHAMINE 30 MG/ML IJ SOLN
30.0000 mg | Freq: Once | INTRAMUSCULAR | Status: AC
  Administered 2024-01-26: 30 mg via INTRAVENOUS

## 2024-01-26 MED ORDER — CEFAZOLIN SODIUM-DEXTROSE 2-4 GM/100ML-% IV SOLN
2.0000 g | INTRAVENOUS | Status: AC
  Administered 2024-01-26: 2 g via INTRAVENOUS

## 2024-01-26 MED ORDER — PROPOFOL 1000 MG/100ML IV EMUL
INTRAVENOUS | Status: AC
  Filled 2024-01-26: qty 200

## 2024-01-26 MED ORDER — TRIAMCINOLONE ACETONIDE 40 MG/ML IJ SUSP
INTRAMUSCULAR | Status: AC
  Filled 2024-01-26: qty 2

## 2024-01-26 MED ORDER — PROPOFOL 10 MG/ML IV BOLUS
INTRAVENOUS | Status: AC
  Filled 2024-01-26: qty 20

## 2024-01-26 MED ORDER — OXYCODONE HCL 5 MG PO TABS: 5.0000 mg | ORAL_TABLET | ORAL | 0 refills | Status: DC | PRN

## 2024-01-26 SURGICAL SUPPLY — 50 items
BLADE SAW 90X13X1.19 OSCILLAT (BLADE) ×1 IMPLANT
BLADE SAW SAG 25X90X1.19 (BLADE) ×1 IMPLANT
BLADE SAW SGTL 13X75X1.27 (BLADE) IMPLANT
BLADE SURG SZ20 CARB STEEL (BLADE) ×1 IMPLANT
BNDG COMPR 6X5.8 VLCR NS LF (GAUZE/BANDAGES/DRESSINGS) ×1 IMPLANT
CEMENT VACUUM MIXING SYSTEM (MISCELLANEOUS) ×1 IMPLANT
CHLORAPREP W/TINT 26 (MISCELLANEOUS) ×1 IMPLANT
COMPONENT FEM KNEE STD PS 8 LT (Joint) IMPLANT
COMPONENT PATELLA 3 PEG 38 (Joint) IMPLANT
COMPONENT TIB KNEE PS 0D LT (Joint) IMPLANT
COOLER ICEMAN CLASSIC (MISCELLANEOUS) ×1 IMPLANT
COVER MAYO STAND STRL (DRAPES) ×1 IMPLANT
CUFF TRNQT CYL 24X4X16.5-23 (TOURNIQUET CUFF) IMPLANT
DRAPE IMP U-DRAPE 54X76 (DRAPES) ×1 IMPLANT
DRAPE SHEET LG 3/4 BI-LAMINATE (DRAPES) ×1 IMPLANT
DRAPE U-SHAPE 47X51 STRL (DRAPES) ×1 IMPLANT
DRSG MEPILEX SACRM 8.7X9.8 (GAUZE/BANDAGES/DRESSINGS) IMPLANT
DRSG OPSITE POSTOP 4X10 (GAUZE/BANDAGES/DRESSINGS) IMPLANT
DRSG OPSITE POSTOP 4X8 (GAUZE/BANDAGES/DRESSINGS) IMPLANT
ELECT CAUTERY BLADE 6.4 (BLADE) ×1 IMPLANT
ELECTRODE REM PT RTRN 9FT ADLT (ELECTROSURGICAL) ×1 IMPLANT
GAUZE XEROFORM 1X8 LF (GAUZE/BANDAGES/DRESSINGS) ×1 IMPLANT
GLOVE BIO SURGEON STRL SZ7.5 (GLOVE) ×4 IMPLANT
GLOVE BIO SURGEON STRL SZ8 (GLOVE) ×4 IMPLANT
GLOVE BIOGEL PI IND STRL 8 (GLOVE) ×2 IMPLANT
GOWN STRL REUS W/ TWL LRG LVL3 (GOWN DISPOSABLE) IMPLANT
GOWN STRL REUS W/ TWL XL LVL3 (GOWN DISPOSABLE) ×1 IMPLANT
HOOD PEEL AWAY T7 (MISCELLANEOUS) ×3 IMPLANT
KIT TURNOVER KIT A (KITS) ×1 IMPLANT
MANIFOLD NEPTUNE II (INSTRUMENTS) ×1 IMPLANT
NDL SPNL 20GX3.5 QUINCKE YW (NEEDLE) ×1 IMPLANT
NS IRRIG 500ML POUR BTL (IV SOLUTION) ×1 IMPLANT
PACK TOTAL KNEE (MISCELLANEOUS) ×1 IMPLANT
PAD COLD UNI WRAP-ON (PAD) ×1 IMPLANT
PENCIL SMOKE EVACUATOR (MISCELLANEOUS) ×1 IMPLANT
PIN DRILL HDLS TROCAR 75 4PK (PIN) IMPLANT
PUTTY DBX 1CC DEPUY (Putty) IMPLANT
SCREW FEMALE HEX FIX 25X2.5 (ORTHOPEDIC DISPOSABLE SUPPLIES) IMPLANT
SOL .9 NS 3000ML IRR UROMATIC (IV SOLUTION) ×1 IMPLANT
SOLN STERILE WATER 500 ML (IV SOLUTION) ×1 IMPLANT
STAPLER SKIN PROX 35W (STAPLE) ×1 IMPLANT
STEM TIBIAL 10 8-11 EF POLY LT (Joint) IMPLANT
STOCKINETTE IMPERV 14X48 (MISCELLANEOUS) ×1 IMPLANT
SUCTION TUBE FRAZIER 10FR DISP (SUCTIONS) ×1 IMPLANT
SUT VIC AB 0 CT1 36 (SUTURE) ×3 IMPLANT
SUT VIC AB 2-0 CT1 TAPERPNT 27 (SUTURE) ×3 IMPLANT
SYR 10ML LL (SYRINGE) ×1 IMPLANT
SYR 30ML LL (SYRINGE) IMPLANT
TIP FAN IRRIG PULSAVAC PLUS (DISPOSABLE) ×1 IMPLANT
TRAP FLUID SMOKE EVACUATOR (MISCELLANEOUS) ×1 IMPLANT

## 2024-01-26 NOTE — H&P (Signed)
 History of Present Illness: Gary Schultz is a 58 y.o. male who presents today for his surgical history and physical for upcoming left total knee arthroplasty. Surgery scheduled with Dr. Edie on 01/26/2024. The patient denies any changes in his medical history since he was last evaluated. He denies any recent falls or trauma affecting the left knee. He denies any personal history of heart attack, stroke, asthma or COPD. Denies any history of blood clots. Denies any history of diabetes. The patient does take chronic pain medication, he is currently in between pain management providers and has decreased his oxycodone  because of this. Currently receiving from his primary care physician.  Past Medical History: Chickenpox  DDD (degenerative disc disease), lumbar  Hypertension   Past Surgical History: L4-5 discectomy 11/2008  Arthroscopic partial medial meniscectomy with abrasion chondroplasty of grade 2-3 chondromalacial changes of patella, left knee Left 09/28/2018 (Dr. Edie)  Attempted arthroscopic medial meniscus repair, arthroscopic partial medial meniscectomy, and arthroscopic abrasion chondroplasty, right knee. Right 12/07/2018 (Dr. 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 Left 03/29/2019 (Dr. Edie)  Endoscopic right carpal tunnel release. Right 12/22/2019 (Dr. Edie)  Endoscopic left carpal tunnel release. Left 02/29/2020 (Dr. Edie)  ARTHROPLASTY TOTAL KNEE Right 04/22/2023 (Dr. Edie)  Right Shoulder Surgery   Past Family History: High blood pressure (Hypertension) Mother  High blood pressure (Hypertension) Father   Medications: rOPINIRole  (REQUIP ) 0.5 MG immediate release tablet Take 0.5 mg by mouth at bedtime  BD LUER-LOK SYRINGE 3 mL 23 gauge x 1 1/2 Syrg USE TO ADMINSTER TESTOSTERONE  EVERY 2 WEEKS  diclofenac  (VOLTAREN ) 1 % topical gel Apply 2 g topically 4 (four) times  daily  fluticasone  propionate (FLONASE ) 50 mcg/actuation nasal spray Place 2 sprays into both nostrils once daily as needed  gabapentin  (NEURONTIN ) 600 MG tablet Take 1,800 mg by mouth 3 (three) times daily  lidocaine  (LIDODERM ) 5 % patch Place 1 patch onto the skin once daily  lisinopriL  (ZESTRIL ) 30 MG tablet Take 30 mg by mouth once daily  meloxicam (MOBIC) 7.5 MG tablet 1 TABLET ORALLY ONCE A DAY INFLAMMATION  methylPREDNISolone  (MEDROL  DOSEPACK) 4 mg tablet AS DIRECTED ORALLY ONCE INFLAMMATIONS 6 DAYS  omeprazole  (PRILOSEC) 20 MG DR capsule TAKE 1 CAPSULE BY MOUTH ONCE DAILY BEFORE BREAKFAST  oxyCODONE  (DAZIDOX) 20 mg immediate release tablet Take by mouth Take 20 mg by mouth 5 (five) times daily as needed (pain).  testosterone  cypionate (DEPO-TESTOSTERONE ) 200 mg/mL injection Inject 200 mg into the muscle   Allergies: Acetaminophen  (Hives, Itching and Rash, Stomach pain)   Review of Systems:  A comprehensive 14 point ROS was performed, reviewed by me today, and the pertinent orthopaedic findings are documented in the HPI.  Physical Exam: BP (!) 150/80  Ht 185.4 cm (6' 1)  Wt 88 kg (194 lb)  BMI 25.60 kg/m  General/Constitutional: The patient appears to be well-nourished, well-developed, and in no acute distress. Neuro/Psych: Normal mood and affect, oriented to person, place and time. Eyes: Non-icteric. Pupils are equal, round, and reactive to light, and exhibit synchronous movement. ENT: Unremarkable. Lymphatic: No palpable adenopathy. Respiratory: Lungs clear to auscultation, Normal chest excursion, No wheezes, and Non-labored breathing Cardiovascular: Regular rate and rhythm. No murmurs. and No edema, swelling or tenderness, except as noted in detailed exam. Integumentary: No impressive skin lesions present, except as noted in detailed exam. Musculoskeletal: Unremarkable, except as noted in detailed exam.  Left knee exam: Skin inspection of his left knee  is notable for  well-healed arthroscopic portal sites as well as mild swelling, but otherwise is unremarkable. No erythema, ecchymosis, abrasions, or other skin abnormalities are identified. He exhibits a small effusion. The knee is in mild varus. There is mild tenderness to palpation along the medial joint line as well as over the posterior medial aspect of the knee, but he denies any lateral joint line or peripatellar tenderness. Actively, he lacks 10 degrees of extension but is able to flex his knee to 125 degrees. Passively, the knee can be extended to 5 degrees and flexed to 130 degrees. He notes mild pain with maximal flexion. His patella tracks well with minimal crepitance. The knee is stable to varus and valgus stressing, although he does have mild pseudolaxity to varus stressing. He remains neurovascularly intact to the left lower extremity and foot.  Imaging: A recent MRI scan of the left knee is available for review. By report, the study demonstrates evidence of a complex tear of the posterior portion of the medial meniscus, as well as tri-compartmental degenerative changes most pronounced involving the medial compartment. No ligamentous pathology is identified. Both the films and report were reviewed by myself and discussed with the patient.   AP and sunrise views of the left knee were obtained in April of this year. These x-rays do demonstrate moderate osteoarthritic changes primarily involving the apartment with loss of medial compartment joint space in addition to underlying subchondral sclerosis to both the medial and lateral tibial plateau. No acute fractures are seen.  Impression: 1. Primary osteoarthritis of left knee. 2. Complex tear of medial meniscus of left knee.  Plan:  1. Treatment options were discussed today with the patient. 2. The patient is scheduled to undergo a left total knee arthroplasty with Dr. Edie on 01/26/2024. 3. The patient was instructed on the risk and benefits of surgical  intervention and wishes to proceed at this time. 4. This document will serve as a surgical history of physical the patient. He will follow-up per standard postop protocol. 5. They can call the clinic they have any questions, new symptoms develop or symptoms worsen.  The procedure was discussed with the patient, as were the potential risks (including bleeding, infection, nerve and/or blood vessel injury, persistent or recurrent pain, failure of the hardware, instability, stiffness, need for further surgery, blood clots, strokes, heart attacks and/or arhythmias, pneumonia, etc.) and benefits. The patient states his understanding and wishes to proceed.    H&P reviewed and patient re-examined. No changes.

## 2024-01-26 NOTE — Evaluation (Signed)
 Physical Therapy Evaluation Patient Details Name: Gary Schultz MRN: [id number] DOB: 1965-07-10 Today's Date: 01/26/2024  History of Present Illness  Pt. 58 y/o male S/P left knee total arthroplasty  Clinical Impression  Patient noted to be in supine position at PT arrival in room, for an initial PT evaluation due to a decline in functional status, with baseline mobility reported as independent, and currently requiring supervision/modI for all mobility. The patient is A&O x 4, presenting with good willingness to work with PT and goals of going home, with discharge expectations that include HHPT. The patient resides in a house and lives with wife with family/friend support. There are 6 STE with L and R rail and inside the residence. The overall clinical impression is that the patient presents with mild mobility limitations secondary to expected TKA. Recommended skilled PT will address safety, mobility, and discharge planning. PT recommendation to d/c patient to HHPT upon medical clearance.        If plan is discharge home, recommend the following: A little help with walking and/or transfers;Help with stairs or ramp for entrance;Assist for transportation   Can travel by private vehicle        Equipment Recommendations None recommended by PT  Recommendations for Other Services       Functional Status Assessment Patient has had a recent decline in their functional status and/or demonstrates limited ability to make significant improvements in function in a reasonable and predictable amount of time     Precautions / Restrictions Restrictions LLE Weight Bearing Per Provider Order: Weight bearing as tolerated      Mobility  Bed Mobility Overal bed mobility: Modified Independent                  Transfers Overall transfer level: Modified independent Equipment used: Rolling walker (2 wheels)                    Ambulation/Gait Ambulation/Gait assistance: Modified  independent (Device/Increase time), Independent Gait Distance (Feet): 150 Feet Assistive device: Rolling walker (2 wheels) Gait Pattern/deviations: Step-to pattern, Step-through pattern, Decreased stance time - left, Decreased stride length, Trunk flexed, Antalgic Gait velocity: decreased   Pre-gait activities: weight shift General Gait Details: vc for RW management; expected gait abnormalties TKA  Stairs Stairs: Yes Stairs assistance: Modified independent (Device/Increase time), Supervision Stair Management: One rail Left, One rail Right, Step to pattern, Backwards Number of Stairs: 8 General stair comments: vc for stepping sequence  Wheelchair Mobility     Tilt Bed    Modified Rankin (Stroke Patients Only)       Balance Overall balance assessment: Modified Independent                                           Pertinent Vitals/Pain Pain Assessment Pain Assessment: No/denies pain    Home Living Family/patient expects to be discharged to:: Private residence Living Arrangements: Spouse/significant other Available Help at Discharge: Family Type of Home: House Home Access: Stairs to enter   Secretary/administrator of Steps: 4 Alternate Level Stairs-Number of Steps: 15 Home Layout: Multi-level;Able to live on main level with bedroom/bathroom;Bed/bath upstairs Home Equipment: Agricultural Consultant (2 wheels);Wheelchair - power      Prior Function Prior Level of Function : Independent/Modified Independent;Driving;Working/employed                     Extremity/Trunk  Assessment        Lower Extremity Assessment Lower Extremity Assessment: Generalized weakness;LLE deficits/detail LLE Deficits / Details: L TKA; decreased ankle dorsiflexion LLE Sensation: decreased light touch    Cervical / Trunk Assessment Cervical / Trunk Assessment: Normal  Communication   Communication Communication: No apparent difficulties    Cognition Arousal:  Alert Behavior During Therapy: WFL for tasks assessed/performed   PT - Cognitive impairments: No apparent impairments                         Following commands: Intact       Cueing Cueing Techniques: Verbal cues     General Comments      Exercises     Assessment/Plan    PT Assessment All further PT needs can be met in the next venue of care  PT Problem List Decreased strength;Decreased mobility;Decreased range of motion;Decreased balance       PT Treatment Interventions      PT Goals (Current goals can be found in the Care Plan section)  Acute Rehab PT Goals Patient Stated Goal: wants to go home PT Goal Formulation: With patient Time For Goal Achievement: 02/09/24 Potential to Achieve Goals: Good    Frequency       Co-evaluation               AM-PAC PT 6 Clicks Mobility  Outcome Measure Help needed turning from your back to your side while in a flat bed without using bedrails?: None Help needed moving from lying on your back to sitting on the side of a flat bed without using bedrails?: None Help needed moving to and from a bed to a chair (including a wheelchair)?: None Help needed standing up from a chair using your arms (e.g., wheelchair or bedside chair)?: None Help needed to walk in hospital room?: None Help needed climbing 3-5 steps with a railing? : A Little 6 Click Score: 23    End of Session Equipment Utilized During Treatment: Gait belt Activity Tolerance: Patient tolerated treatment well Patient left: in chair;with family/visitor present;with nursing/sitter in room Nurse Communication: Mobility status PT Visit Diagnosis: Other abnormalities of gait and mobility (R26.89);Muscle weakness (generalized) (M62.81)    Time: 1545-1610 PT Time Calculation (min) (ACUTE ONLY): 25 min   Charges:   PT Evaluation $PT Eval Low Complexity: 1 Low   PT General Charges $$ ACUTE PT VISIT: 1 Visit         Sherlean Lesches DPT, PT     Alessio Bogan A Ennio Houp 01/26/2024, 4:23 PM

## 2024-01-26 NOTE — TOC Initial Note (Signed)
 Transition of Care [facility]) - Initial/Assessment Note    Patient Details  Name: Gary Schultz MRN: [id number] Date of Birth: [birth year]  [facility]) CM/SW Contact:    Nathanael CHRISTELLA Ring, RN Phone Number: 01/26/2024, 11:44 AM  Clinical Narrative:                 S/P left knee total arthroplasty, plan for discharge home today.  Per PACU nurse he brought his walker with him from home.  Home Health with [facility] has been pre-arranged with Dr. Geroldine office.  [place]  with [facility] notified of dc today.   Expected Discharge Plan: Home w Home Health Services Barriers to Discharge: Continued Medical Work up (PT OT)   Patient Goals and CMS Choice            Expected Discharge Plan and Services   Discharge Planning Services: CM Consult   Living arrangements for the past 2 months: Single Family Home Expected Discharge Date: 01/26/24               DME Arranged: N/A         HH Arranged: PT, OT HH Agency: [facility] Date HH Agency Contacted: 01/26/24 Time HH Agency Contacted: 1142 Representative spoke with at [facility] Agency: [place]   Prior Living Arrangements/Services Living arrangements for the past 2 months: Single Family Home Lives with:: Spouse Patient language and need for interpreter reviewed:: Yes        Need for Family Participation in Patient Care: Yes (Comment) Care giver support system in place?: Yes (comment) Current home services: DME (RW) Criminal Activity/Legal Involvement Pertinent to Current Situation/Hospitalization: No - Comment as needed  Activities of Daily Living      Permission Sought/Granted                  Emotional Assessment         Alcohol / Substance Use: Not Applicable Psych Involvement: No (comment)  Admission diagnosis:  Primary osteoarthritis of left knee [M17.12] Patient Active Problem List   Diagnosis Date Noted   Chronic radicular lumbar pain 06/23/2023   Chronic pain syndrome 06/23/2023    Chronic right-sided low back pain with right-sided sciatica 07/10/2022   Overweight with body mass index (BMI) of 27 to 27.9 in adult 07/10/2022   Primary osteoarthritis of both knees 07/18/2021   Screen for colon cancer    BPH associated with nocturia 05/16/2020   Arthritis of carpometacarpal (CMC) joint of right thumb 05/16/2020   Major depressive disorder, recurrent, in remission 03/09/2020   GAD (generalized anxiety disorder) 03/09/2020   Chronic post-traumatic headache, not intractable 07/29/2019   Post concussion syndrome 07/29/2019   Atherosclerosis of right carotid artery 01/20/2019   Tear of meniscus of knee joint 12/26/2015   Numbness and tingling in both hands 11/08/2015   Hypertension 08/23/2015   Chronic pain of both knees 08/23/2015   DDD (degenerative disc disease), lumbar 08/02/2014   Facet syndrome, lumbar 08/02/2014   Cervical facet syndrome 08/02/2014   Carpal tunnel syndrome 08/02/2014   PCP:  Edman Marsa PARAS, DO Pharmacy:   [facility] [phone] - [place], [place] - [address] AT [facility] [address] [place] [address] Phone: [phone] Fax: [phone]     Social Drivers of Health (SDOH) Social History: SDOH Screenings   Food Insecurity: Food Insecurity Present (12/30/2023)  Housing: Low Risk  (12/30/2023)  Transportation Needs: No Transportation Needs (12/30/2023)  Utilities: Not At Risk (05/11/2023)   Received from  [facility]  Alcohol Screen: Low Risk  (12/30/2023)  Depression (PHQ2-9): Medium Risk (01/18/2024)  Financial Resource Strain: Medium Risk (12/30/2023)  Physical Activity: Sufficiently Active (12/30/2023)  Social Connections: Moderately Integrated (12/30/2023)  Stress: No Stress Concern Present (12/30/2023)  Tobacco Use: Low Risk  (01/26/2024)   SDOH Interventions:     Readmission Risk Interventions     No data to display

## 2024-01-26 NOTE — Anesthesia Preprocedure Evaluation (Signed)
 Anesthesia Evaluation  Patient identified by MRN, date of birth, ID band Patient awake    Reviewed: Allergy & Precautions, H&P , NPO status , Patient's Chart, lab work & pertinent test results  History of Anesthesia Complications Negative for: history of anesthetic complications  Airway Mallampati: III  TM Distance: >3 FB Neck ROM: full    Dental no notable dental hx.    Pulmonary neg pulmonary ROS   Pulmonary exam normal        Cardiovascular Exercise Tolerance: Good hypertension, Pt. on medications Normal cardiovascular exam     Neuro/Psych  PSYCHIATRIC DISORDERS Anxiety Depression    Chronic right-sided low back pain with right-sided sciatica Chronic opioid use  Neuromuscular disease    GI/Hepatic Neg liver ROS,GERD  Medicated and Controlled,,  Endo/Other  negative endocrine ROS    Renal/GU    BPH with LU TS    Musculoskeletal  (+) Arthritis ,    Abdominal Normal abdominal exam  (+)   Peds  Hematology negative hematology ROS (+)   Anesthesia Other Findings Past Medical History: No date: Anxiety No date: Carpal tunnel syndrome     Comment:  bilateral No date: DDD (degenerative disc disease), lumbar No date: Generalized headaches     Comment:  from neck injury No date: GERD (gastroesophageal reflux disease) No date: Hypertension No date: Neck pain No date: Neuromuscular disorder (HCC) No date: Varicose veins  Past Surgical History: 2006: BACK SURGERY     Comment:  L4-L5 herniated disc repair 12/22/2019: CARPAL TUNNEL RELEASE; Right     Comment:  Procedure: CARPAL TUNNEL RELEASE ENDOSCOPIC;  Surgeon:               Edie Norleen PARAS, MD;  Location: [facility] ORS;  Service:               Orthopedics;  Laterality: Right; 02/29/2020: CARPAL TUNNEL RELEASE; Left     Comment:  Procedure: CARPAL TUNNEL RELEASE ENDOSCOPIC;  Surgeon:               Edie Norleen PARAS, MD;  Location: [facility] OR[facility];  Service:                Orthopedics;  Laterality: Left; No date: CLAVICLE SURGERY; Right 06/08/2020: COLONOSCOPY WITH PROPOFOL ; N/A     Comment:  Procedure: COLONOSCOPY WITH PROPOFOL ;  Surgeon: Jinny Carmine, MD;  Location: [facility];  Service:               Endoscopy;  Laterality: N/A; 2019: FRACTURE SURGERY; Left 09/28/2018: KNEE ARTHROSCOPY WITH MEDIAL MENISECTOMY; Left     Comment:  Procedure: KNEE ARTHROSCOPY WITH DEBRIDEMENT, abrasion               chondroplasty of patella, PARTIAL MEDIAL MENISCECTOMY;                Surgeon: Edie Norleen PARAS, MD;  Location: [facility] OR[facility];                Service: Orthopedics;  Laterality: Left; 12/07/2018: KNEE ARTHROSCOPY WITH MEDIAL MENISECTOMY; Right     Comment:  Procedure: RIGHT KNEE ARTHROSCOPY WITH DEBRIDEMENT,               REPAIR, PARTIAL MEDIAL MENISECTOMY;  Surgeon: Edie Norleen PARAS, MD;  Location: [facility] OR[facility];  Service: Orthopedics;  Laterality: Right; 03/29/2019: REPAIR OF PERONEUS BREVIS TENDON; Left     Comment:  Procedure: Debridement of chronic peroneus brevis               tendinopathy with reconstruction of calcaneofibular and               anterior talofibular ligaments;  Surgeon: Edie Norleen PARAS,               MD;  Location: [facility];  Service: Orthopedics;                Laterality: Left; 1992: SHOULDER SURGERY; Right     Reproductive/Obstetrics negative OB ROS                              Anesthesia Physical Anesthesia Plan  ASA: 2  Anesthesia Plan: Spinal   Post-op Pain Management:    Induction:   PONV Risk Score and Plan: 1 and Propofol  infusion, TIVA and Treatment may vary due to age or medical condition  Airway Management Planned: Natural Airway and Simple Face Mask  Additional Equipment:   Intra-op Plan:   Post-operative Plan:   Informed Consent: I have reviewed the patients History and Physical, chart, labs and discussed the procedure including the risks, benefits  and alternatives for the proposed anesthesia with the patient or authorized representative who has indicated his/her understanding and acceptance.     Dental Advisory Given  Plan Discussed with: CRNA and Surgeon  Anesthesia Plan Comments:          Anesthesia Quick Evaluation

## 2024-01-26 NOTE — Anesthesia Procedure Notes (Addendum)
 Spinal  Patient location during procedure: OR Start time: 01/26/2024 7:55 AM End time: 01/26/2024 8:05 AM Reason for block: surgical anesthesia Staffing Performed: other anesthesia staff  Resident/CRNA: Trudy Rankin LABOR, CRNA Other anesthesia staff: Colon Morna SQUIBB, RN Performed by: Trudy Rankin LABOR, CRNA Authorized by: Trudy Rankin LABOR, CRNA   Preanesthetic Checklist Completed: patient identified, IV checked, site marked, risks and benefits discussed, surgical consent, monitors and equipment checked, pre-op evaluation and timeout performed Spinal Block Patient position: sitting Prep: ChloraPrep Patient monitoring: heart rate, cardiac monitor, continuous pulse ox and blood pressure Approach: midline Location: L4-5 Injection technique: single-shot Needle Needle type: Pencan  Needle gauge: 24 G Needle length: 10 cm Assessment Sensory level: T4 Events: CSF return

## 2024-01-26 NOTE — Transfer of Care (Signed)
 Immediate Anesthesia Transfer of Care Note  Patient: Gary Schultz  Procedure(s) Performed: TOTAL KNEE ARTHROPLASTY - RNFA (Left: Knee)  Patient Location: PACU  Anesthesia Type:Spinal  Level of Consciousness: drowsy and responds to stimulation  Airway & Oxygen Therapy: Patient Spontanous Breathing and Patient connected to nasal cannula oxygen  Post-op Assessment: Report given to RN and Post -op Vital signs reviewed and stable  Post vital signs: Reviewed and stable  Last Vitals:  Vitals Value Taken Time  BP 126/88 01/26/24 10:19  Temp    Pulse 77 01/26/24 10:22  Resp 15 01/26/24 10:22  SpO2 97 % 01/26/24 10:22  Vitals shown include unfiled device data.  Last Pain:  Vitals:   01/26/24 0627  TempSrc: Oral  PainSc: 7          Complications: No notable events documented.

## 2024-01-26 NOTE — Op Note (Signed)
 01/26/2024  10:11 AM  Patient:   Gary Schultz  Pre-Op Diagnosis:   Degenerative joint disease, left knee.  Post-Op Diagnosis:   Same  Procedure:   Left TKA using all-pressfit Zimmer Persona system with a #8 PCR femur, a(n) F-sized  tibial tray with a 10 mm medial congruent E-poly insert, and a 10 x 35 mm all-poly 3-pegged domed patella.  Surgeon:   DOROTHA Reyes Maltos, MD  Assistant:   Gustavo Level, PA-C   Anesthesia:   Spinal  Findings:   As above  Complications:   None  EBL:   5 cc  Fluids:   200 cc crystalloid  UOP:   None  TT:   90 minutes at 300 mmHg  Drains:   None  Closure:   Staples  Implants:   As above  Brief Clinical Note:   The patient is a 58 year old male with a long history of progressively worsening left knee pain. The patient's symptoms have progressed despite medications, activity modification, injections, etc. The patient's history and examination were consistent with advanced degenerative joint disease of the left knee confirmed by plain radiographs. The patient presents at this time for a left total knee arthroplasty.  Procedure:   The patient was brought into the operating room. After adequate spinal anesthesia was obtained, the patient was repositioned in the supine position on the operating room table. The left lower extremity was prepped with ChloraPrep solution and draped sterilely. Preoperative antibiotics were administered. A timeout was performed to verify the appropriate surgical site before the limb was exsanguinated with an Esmarch and the tourniquet inflated to 300 mmHg.   A standard anterior approach to the knee was made through an approximately 6-7 inch incision. The incision was carried down through the subcutaneous tissues to expose superficial retinaculum. This was split the length of the incision and the medial flap elevated sufficiently to expose the medial retinaculum. The medial retinaculum was incised, leaving a 3-4 mm cuff of tissue  on the patella. This was extended distally along the medial border of the patellar tendon and proximally through the medial third of the quadriceps tendon. A subtotal fat pad excision was performed before the soft tissues were elevated off the anteromedial and anterolateral aspects of the proximal tibia to the level of the collateral ligaments. The anterior portions of the medial and lateral menisci were removed, as was the anterior cruciate ligament. With the knee flexed to 90, the external tibial guide was positioned and the appropriate proximal tibial cut made. This piece was taken to the back table where it was measured and found to be optimally replicated by a(n) F-sized component.  Attention was directed to the distal femur. The intramedullary canal was accessed through a 3/8 drill hole. The intramedullary guide was inserted and placed at 5 of valgus alignment. Using the +0 slot, the distal cut was made. The distal femur was measured and found to be optimally replicated by the #8 component. The #8 4-in-1 cutting block was positioned and first the posterior, then the posterior chamfer, the anterior, and finally the anterior chamfer cuts were made after verifying that the anterior cortex would not be notched.   At this point, the posterior portions medial and lateral menisci were removed. A trial reduction was performed using the appropriate femoral and tibial components with the 10 mm insert. This demonstrated excellent stability to varus and valgus stressing both in flexion and extension while permitting full extension. Patellar tracking was assessed and found to be excellent.  The tibial trial position was marked on the proximal tibia. The patella thickness was measured and found to be 22 mm, so the appropriate cut was made. The patellar surface was measured and found to be optimally replicated by the 35 mm component. The three peg holes were drilled in place before the trial button was inserted.  Patella tracking was assessed and found to be excellent, passing the no thumb test. The lug holes were drilled into the distal femur before the trial component was removed.  The tibial tray was repositioned before the keel was created using the appropriate tower, drills, and punch.  The bony surfaces were prepared for implantation by irrigating them thoroughly with sterile saline solution via the jet lavage system. A bone plug was fashioned from some of the bone that had been removed previously and used to plug the distal femoral canal. In addition, a cocktail of 20 cc of Exparel , 30 cc of 0.5% Sensorcaine , 2 cc of Kenalog  40 (80 mg), and 30 mg of Toradol  diluted out to 90 cc with normal saline was injected into the postero-medial and postero-lateral aspects of the knee, the medial and lateral gutter regions, and the peri-incisional tissues to help with postoperative analgesia.    The tibial tray was impacted into place first with care taken to be sure the component was fully seated. Next, the femoral component was impacted into place again with care taken to be sure that the component was fully improperly seated. The permanent 10 mm medial congruent E-polyethylene insert was snapped into place with care taken to ensure appropriate locking of the insert. Finally, the patella was positioned and compressed into place using the patellar clamp. Again, care was taken to be sure that the component was fully seated. The knee was placed through a range of motion with the findings as described above.    The wound was copiously irrigated with sterile saline solution using the jet lavage system before the quadriceps tendon and retinacular layer were reapproximated using #0 Vicryl interrupted sutures. The superficial retinacular layer also was closed using a running #0 Vicryl suture. The subcutaneous tissues were closed in several layers using 2-0 Vicryl interrupted sutures. The skin was closed using staples. A  sterile honeycomb dressing was applied to the skin before the leg was wrapped with an Ace wrap to accommodate the Polar Care device. The patient was then awakened and returned to the recovery room in satisfactory condition after tolerating the procedure well.

## 2024-01-26 NOTE — Discharge Instructions (Addendum)
 Orthopedic discharge instructions: May shower with intact OpSite dressing. Apply ice frequently to knee or use Polar Care. Start Eliquis  1 tablet (2.5 mg) twice daily on Wednesday, 01/27/2024, for 2 weeks, then take aspirin 325 mg twice daily for 4 weeks. Take pain medication as prescribed when needed.  May weight-bear as tolerated on left leg - use walker for balance and support. Follow-up in 10-14 days or as scheduled.

## 2024-01-27 ENCOUNTER — Other Ambulatory Visit: Payer: Self-pay | Admitting: Urology

## 2024-01-27 ENCOUNTER — Encounter: Payer: Self-pay | Admitting: Surgery

## 2024-01-27 DIAGNOSIS — E291 Testicular hypofunction: Secondary | ICD-10-CM

## 2024-01-28 ENCOUNTER — Telehealth: Payer: Self-pay

## 2024-01-28 NOTE — Anesthesia Postprocedure Evaluation (Signed)
 Anesthesia Post Note  Patient: Gary Schultz  Procedure(s) Performed: TOTAL KNEE ARTHROPLASTY - RNFA (Left: Knee)  Patient location during evaluation: Nursing Unit Anesthesia Type: Spinal Level of consciousness: oriented and awake and alert Pain management: pain level controlled Vital Signs Assessment: post-procedure vital signs reviewed and stable Respiratory status: spontaneous breathing and respiratory function stable Cardiovascular status: stable Postop Assessment: no headache, no backache, no apparent nausea or vomiting and patient able to bend at knees Anesthetic complications: no Comments: BP's being treated day 3 with labetalol   Last BP 169/64   No notable events documented.   Last Vitals:  Vitals:   01/26/24 1408 01/26/24 1719  BP: (!) 154/87 (!) 155/95  Pulse: 84 98  Resp: 16 16  Temp: 37 C 37.3 C  SpO2: 98% 98%    Last Pain:  Vitals:   01/27/24 0908  TempSrc:   PainSc: 8                  Alfrieda LILLETTE Lan

## 2024-01-28 NOTE — Anesthesia Postprocedure Evaluation (Incomplete)
 Anesthesia Post Note  Patient: Gary Schultz  Procedure(s) Performed: TOTAL KNEE ARTHROPLASTY - RNFA (Left: Knee)  Patient location during evaluation: Nursing Unit Anesthesia Type: Spinal Level of consciousness: oriented and awake and alert Pain management: pain level controlled Vital Signs Assessment: post-procedure vital signs reviewed and stable Respiratory status: spontaneous breathing and respiratory function stable Cardiovascular status: stable Postop Assessment: no headache, no backache, no apparent nausea or vomiting and patient able to bend at knees Anesthetic complications: no Comments: BP continues to be high (169/64) -b   No notable events documented.   Last Vitals:  Vitals:   01/26/24 1408 01/26/24 1719  BP: (!) 154/87 (!) 155/95  Pulse: 84 98  Resp: 16 16  Temp: 37 C 37.3 C  SpO2: 98% 98%    Last Pain:  Vitals:   01/27/24 0908  TempSrc:   PainSc: 8                  Alfrieda LILLETTE Lan

## 2024-01-28 NOTE — Telephone Encounter (Signed)
 Left vm is regards to scheduling an appointment in February for labs and appointment with [facility]. Appointments were scheduled, appointments can be changed if patient is not available on that day date and time.   Andrea Kirks LPN

## 2024-02-04 ENCOUNTER — Telehealth: Payer: Self-pay

## 2024-02-04 ENCOUNTER — Other Ambulatory Visit: Payer: Self-pay

## 2024-02-04 DIAGNOSIS — E291 Testicular hypofunction: Secondary | ICD-10-CM

## 2024-02-04 NOTE — Telephone Encounter (Signed)
 Spoke with patient in regards to lab and follow-up appointment. Patient verbalized understanding of appointment day and time.  Andrea Kirks LPN

## 2024-02-14 ENCOUNTER — Encounter: Payer: Self-pay | Admitting: Family Medicine

## 2024-02-14 DIAGNOSIS — K219 Gastro-esophageal reflux disease without esophagitis: Secondary | ICD-10-CM

## 2024-02-16 ENCOUNTER — Telehealth: Admitting: Family Medicine

## 2024-02-16 ENCOUNTER — Encounter: Payer: Self-pay | Admitting: Family Medicine

## 2024-02-16 ENCOUNTER — Other Ambulatory Visit: Payer: Self-pay | Admitting: Family Medicine

## 2024-02-16 DIAGNOSIS — M5416 Radiculopathy, lumbar region: Secondary | ICD-10-CM | POA: Diagnosis not present

## 2024-02-16 DIAGNOSIS — G894 Chronic pain syndrome: Secondary | ICD-10-CM

## 2024-02-16 DIAGNOSIS — G8929 Other chronic pain: Secondary | ICD-10-CM

## 2024-02-16 DIAGNOSIS — F3341 Major depressive disorder, recurrent, in partial remission: Secondary | ICD-10-CM

## 2024-02-16 DIAGNOSIS — G8918 Other acute postprocedural pain: Secondary | ICD-10-CM

## 2024-02-16 DIAGNOSIS — F1120 Opioid dependence, uncomplicated: Secondary | ICD-10-CM | POA: Diagnosis not present

## 2024-02-16 DIAGNOSIS — Z96651 Presence of right artificial knee joint: Secondary | ICD-10-CM

## 2024-02-16 DIAGNOSIS — R142 Eructation: Secondary | ICD-10-CM

## 2024-02-16 MED ORDER — OXYCODONE HCL 20 MG PO TABS: 20.0000 mg | ORAL_TABLET | ORAL | 0 refills | Status: DC | PRN

## 2024-02-16 NOTE — Progress Notes (Signed)
 "  Subjective:    Patient ID: Gary Schultz, male    DOB: [birth year], 58 y.o.   MRN: [id number]  Gary Schultz is a 58 y.o. male presenting on 02/16/2024 for Pain Management   Virtual / Telehealth Encounter - Video Visit via MyChart The purpose of this virtual visit is to provide medical care while limiting exposure to the novel coronavirus (COVID19) for both patient and office staff.  Consent was obtained for remote visit:  Yes.   Answered questions that patient had about telehealth interaction:  Yes.   I discussed the limitations, risks, security and privacy concerns of performing an evaluation and management service by video/telephone. I also discussed with the patient that there may be a patient responsible charge related to this service. The patient expressed understanding and agreed to proceed.  Patient Location: Home Provider Location: [facility] (Office)  Participants in virtual visit: - Patient: Gary Schultz - CMA: Alan Fontana CMA - Provider: Dr Edman   HPI  Discussed the use of AI scribe software for clinical note transcription with the patient, who gave verbal consent to proceed.  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  Postoperative pain following left knee arthroplasty - Severe pain following left knee total arthroplasty performed on January 26, 2024 [facility] Gary Schultz - Pain intensity reaches 6-7 out of 10 - Pain is not adequately controlled by current medication regimen - Pain localized to left knee, back, left shoulder, and left ankle - Poor sleep due to persistent knee and back pain  Analgesic medication response and adverse effects - Current pain management includes hydromorphone  and oxycodone  - Initially taking two 2 mg hydromorphone  pills every four hours with 5 mg oxycodone  for breakthrough - Hydromorphone   dose reduced to one pill every four hours due to concerns about dependency; finds this dose ineffective - We were managing on Oxycodone  10mg , and now orthopedic asked that we manage this pain - Previously managed well with 20 mg oxycodone  during past right knee surgery - Gabapentin  included in regimen; causes drowsiness but does not alleviate pain  Physical therapy and non-pharmacologic pain management - Attending physical therapy through home program; physical therapy exacerbates back pain - Communicated to therapists to take it easy due to back pain flare-up - Performs physical therapy exercises at home, which are beneficial - Uses ice and heat therapy; heat application provides some relief for back pain  Major Depression recurrent partial remission Worse mood with pain Not on medication      02/16/2024   12:46 PM 01/18/2024    1:50 PM 11/26/2023    9:22 AM  Depression screen PHQ 2/9  Decreased Interest 1 1 0  Down, Depressed, Hopeless 1 1 0  PHQ - 2 Score 2 2 0  Altered sleeping 2 1 0  Tired, decreased energy 1 1 0  Change in appetite 1 1 0  Feeling bad or failure about yourself  2 2 0  Trouble concentrating 1 1 0  Moving slowly or fidgety/restless 2 2 0  Suicidal thoughts 0 0 0  PHQ-9 Score 11 10 0   Difficult doing work/chores Somewhat difficult Somewhat difficult Not difficult at all     Data saved with a previous flowsheet row definition       01/18/2024    1:51 PM 11/26/2023    9:22 AM 01/07/2023   10:10 AM  10/06/2022   11:33 AM  GAD 7 : Generalized Anxiety Score  Nervous, Anxious, on Edge 1 0 1 2  Control/stop worrying 2 0 1 1  Worry too much - different things 2 0 1 1  Trouble relaxing 2 0 1 2  Restless 2 0 1 1  Easily annoyed or irritable 2 0 1 1  Afraid - awful might happen 0 0 1 0  Total GAD 7 Score 11 0 7 8  Anxiety Difficulty Very difficult Not difficult at all Somewhat difficult Not difficult at all    Social History[1]  Review of Systems Per HPI  unless specifically indicated above     Objective:    There were no vitals taken for this visit.  Wt Readings from Last 3 Encounters:  01/26/24 195 lb (88.5 kg)  01/20/24 195 lb (88.5 kg)  01/18/24 197 lb 4 oz (89.5 kg)     Physical Exam  Note examination was completely remotely via video observation objective data only  Gen - well-appearing, but uncomfortable in pain HEENT - eyes appear clear without discharge or redness Heart/Lungs - cannot examine virtually - observed no evidence of coughing or labored breathing. Abd - cannot examine virtually  Skin - face visible today- no rash Neuro - awake, alert, oriented Psych - not anxious appearing   Results for orders placed or performed during the hospital encounter of 01/20/24  Surgical pcr screen   Collection Time: 01/20/24 11:09 AM   Specimen: Nasal Mucosa; Nasal Swab  Result Value Ref Range   MRSA, PCR NEGATIVE NEGATIVE   Staphylococcus aureus NEGATIVE NEGATIVE  CBC WITH DIFFERENTIAL   Collection Time: 01/20/24 11:09 AM  Result Value Ref Range   WBC 7.2 4.0 - 10.5 K/uL   RBC 5.11 4.22 - 5.81 MIL/uL   Hemoglobin 15.5 13.0 - 17.0 g/dL   HCT 53.7 60.9 - 47.9 %   MCV 90.4 80.0 - 100.0 fL   MCH 30.3 26.0 - 34.0 pg   MCHC 33.5 30.0 - 36.0 g/dL   RDW 88.2 88.4 - 84.4 %   Platelets 187 150 - 400 K/uL   nRBC 0.0 0.0 - 0.2 %   Neutrophils Relative % 68 %   Neutro Abs 4.9 1.7 - 7.7 K/uL   Lymphocytes Relative 20 %   Lymphs Abs 1.5 0.7 - 4.0 K/uL   Monocytes Relative 10 %   Monocytes Absolute 0.7 0.1 - 1.0 K/uL   Eosinophils Relative 1 %   Eosinophils Absolute 0.1 0.0 - 0.5 K/uL   Basophils Relative 1 %   Basophils Absolute 0.0 0.0 - 0.1 K/uL   Immature Granulocytes 0 %   Abs Immature Granulocytes 0.02 0.00 - 0.07 K/uL  Comprehensive metabolic panel   Collection Time: 01/20/24 11:09 AM  Result Value Ref Range   Sodium 141 135 - 145 mmol/L   Potassium 4.1 3.5 - 5.1 mmol/L   Chloride 105 98 - 111 mmol/L   CO2 29 22 -  32 mmol/L   Glucose, Bld 81 70 - 99 mg/dL   BUN 18 6 - 20 mg/dL   Creatinine, Ser 8.99 0.61 - 1.24 mg/dL   Calcium 8.7 (L) 8.9 - 10.3 mg/dL   Total Protein 6.2 (L) 6.5 - 8.1 g/dL   Albumin 4.2 3.5 - 5.0 g/dL   AST 18 15 - 41 U/L   ALT 17 0 - 44 U/L   Alkaline Phosphatase 73 38 - 126 U/L   Total Bilirubin 0.6 0.0 - 1.2 mg/dL  GFR, Estimated >60 >60 mL/min   Anion gap 7 5 - 15  Urinalysis, Routine w reflex microscopic -Urine, Clean Catch   Collection Time: 01/20/24 11:09 AM  Result Value Ref Range   Color, Urine YELLOW (A) YELLOW   APPearance CLEAR (A) CLEAR   Specific Gravity, Urine 1.019 1.005 - 1.030   pH 5.0 5.0 - 8.0   Glucose, UA NEGATIVE NEGATIVE mg/dL   Hgb urine dipstick NEGATIVE NEGATIVE   Bilirubin Urine NEGATIVE NEGATIVE   Ketones, ur NEGATIVE NEGATIVE mg/dL   Protein, ur NEGATIVE NEGATIVE mg/dL   Nitrite NEGATIVE NEGATIVE   Leukocytes,Ua NEGATIVE NEGATIVE      Assessment & Plan:   Problem List Items Addressed This Visit     Chronic pain of both knees   Relevant Medications   Oxycodone  HCl 20 MG TABS   Chronic pain syndrome   Relevant Medications   Oxycodone  HCl 20 MG TABS   Chronic radicular lumbar pain   Relevant Medications   Oxycodone  HCl 20 MG TABS   Major depressive disorder, recurrent episode, in partial remission   Other Visit Diagnoses       Postoperative pain of left knee    -  Primary   Relevant Medications   Oxycodone  HCl 20 MG TABS       Postoperative pain of left knee Persistent pain post-left knee arthroplasty 01/26/24 Gary Schultz [facility] with post-op paina nd exacerbated by physical therapy, inadequately controlled with current opioids. - Prescribed oxycodone  20 mg every 4 to 6 hours, max five per day, for seven days. For post op pain - STOP Hydromorphine and Oxycodone  5-10mg  previous meds - Instructed to use current medications as backup for emergencies only. - He should coordinate with orthopedic surgeon and physical therapist to  adjust therapy.  Chronic pain syndrome with opioid dependence Chronic pain management complicated by opioid dependence. Previously on Oxycodone  20mg  6 times per day by pain management but no longer established with that practice, too far away >1.5 hours Unsuccessful thus far in relocating to new pain management  Previously I have agreed to manage his opioid therapy at a lower dose, half dose 10mg  up to 6 per day.  Current oxycodone  dose insufficient. Discussed pain management challenges and specialist options.  Chronic pain at multiple sites, management complicated by recent surgery and opioid regimens. - Adjusted opioid regimen to include oxycodone  20 mg every six hours, max four per day, for seven days. - Monitor pain levels, blood pressure, and oxygen saturation. - Discuss potential need for urine test and contract signing at next visit.  Chronic lumbar radiculopathy Flare-ups exacerbated by physical therapy, complicated by recent knee surgery and opioid regimen. - Coordinated with orthopedic surgeon and physical therapist to adjust therapy. - Monitor pain levels and adjust treatment plan accordingly.      Major Depression recurrent partial remission Chronic problem, worse lately with chronic pain Not on medication or therapy    No orders of the defined types were placed in this encounter.   Meds ordered this encounter  Medications   Oxycodone  HCl 20 MG TABS    Sig: Take 1 tablet (20 mg total) by mouth every 4 (four) hours as needed for up to 7 days. Max of 5 per day    Dispense:  35 tablet    Refill:  0    Dose increase from 10 to 20mg . He will discontinue Hydromorphone  and Oxycodone  5mg     Follow up plan: Return in about 9 days (around 02/25/2024).  Patient verbalizes understanding with the above medical recommendations including the limitation of remote medical advice.  Specific follow-up and call-back criteria were given for patient to follow-up or seek medical care  more urgently if needed.  Total duration of direct patient care provided via video conference: 10 minutes   Marsa Officer, DO [facility] 02/16/2024, 11:37 AM     [1]  Social History Tobacco Use   Smoking status: Never    Passive exposure: Never   Smokeless tobacco: Never  Vaping Use   Vaping status: Never Used  Substance Use Topics   Alcohol use: Not Currently    Comment: occassional   Drug use: Yes    Comment: prescribed oxy 20mg  and muscle relaxers   "

## 2024-02-16 NOTE — Patient Instructions (Addendum)
 Dose increase Oxycodone  from 10 to 20mg , stop taking Hydromorphone  and Oxycodone  5 and 10mg .  Please schedule a Follow-up Appointment to: Return in about 9 days (around 02/25/2024).  If you have any other questions or concerns, please feel free to call the office or send a message through MyChart. You may also schedule an earlier appointment if necessary.  Additionally, you may be receiving a survey about your experience at our office within a few days to 1 week by e-mail or mail. We value your feedback.  Marsa Officer, DO [facility], [place]

## 2024-02-17 NOTE — Telephone Encounter (Signed)
 Discontinued on 01/18/24.  Requested Prescriptions  Pending Prescriptions Disp Refills   omeprazole  (PRILOSEC) 20 MG capsule [Pharmacy Med Name: OMEPRAZOLE  DR 20 MG CAPSULE] 90 capsule 3    Sig: TAKE 1 CAPSULE (20 MG TOTAL) BY MOUTH DAILY BEFORE BREAKFAST. EXTRA REFILLS ON FILE     Gastroenterology: Proton Pump Inhibitors Passed - 02/17/2024  1:00 PM      Passed - Valid encounter within last 12 months    Recent Outpatient Visits           Yesterday Postoperative pain of left knee   [facility] Marsa PARAS, DO   1 month ago Annual physical exam   [place] [facility] Marsa PARAS, DO   1 month ago Acute non-recurrent frontal sinusitis   [place] [facility] Marsa PARAS, DO   2 months ago Chronic pain of left knee   [place] [facility] [place], Angeline ORN, NP   7 months ago DDD (degenerative disc disease), cervical   [place] [facility] Marsa PARAS, DO       Future Appointments             In 1 month McGowan, Clotilda DELENA RIGGERS [facility] [place]   In 3 months Hester Alm BROCKS, MD [facility]

## 2024-02-22 DIAGNOSIS — G8929 Other chronic pain: Secondary | ICD-10-CM

## 2024-02-22 DIAGNOSIS — G894 Chronic pain syndrome: Secondary | ICD-10-CM

## 2024-02-22 DIAGNOSIS — M25562 Pain in left knee: Secondary | ICD-10-CM

## 2024-02-22 MED ORDER — OXYCODONE HCL 20 MG PO TABS: 20.0000 mg | ORAL_TABLET | ORAL | 0 refills | Status: DC | PRN

## 2024-02-24 MED ORDER — OMEPRAZOLE 20 MG PO CPDR: 20.0000 mg | DELAYED_RELEASE_CAPSULE | Freq: Every day | ORAL | 3 refills | Status: AC

## 2024-02-24 NOTE — Addendum Note (Signed)
 Addended by: ZELIA GAUZE D on: 02/24/2024 09:49 AM   Modules accepted: Orders

## 2024-02-25 ENCOUNTER — Encounter: Payer: Self-pay | Admitting: Family Medicine

## 2024-02-25 ENCOUNTER — Ambulatory Visit: Admitting: Family Medicine

## 2024-02-25 VITALS — BP 148/90 | HR 90 | Ht 72.0 in | Wt 193.5 lb

## 2024-02-25 DIAGNOSIS — M25561 Pain in right knee: Secondary | ICD-10-CM | POA: Diagnosis not present

## 2024-02-25 DIAGNOSIS — M5416 Radiculopathy, lumbar region: Secondary | ICD-10-CM | POA: Diagnosis not present

## 2024-02-25 DIAGNOSIS — I10 Essential (primary) hypertension: Secondary | ICD-10-CM | POA: Diagnosis not present

## 2024-02-25 DIAGNOSIS — M25562 Pain in left knee: Secondary | ICD-10-CM | POA: Diagnosis not present

## 2024-02-25 DIAGNOSIS — G8918 Other acute postprocedural pain: Secondary | ICD-10-CM | POA: Diagnosis not present

## 2024-02-25 DIAGNOSIS — G8929 Other chronic pain: Secondary | ICD-10-CM | POA: Diagnosis not present

## 2024-02-25 DIAGNOSIS — M62830 Muscle spasm of back: Secondary | ICD-10-CM

## 2024-02-25 DIAGNOSIS — G894 Chronic pain syndrome: Secondary | ICD-10-CM

## 2024-02-25 DIAGNOSIS — F1120 Opioid dependence, uncomplicated: Secondary | ICD-10-CM | POA: Diagnosis not present

## 2024-02-25 MED ORDER — OXYCODONE HCL 20 MG PO TABS: 20.0000 mg | ORAL_TABLET | ORAL | 0 refills | Status: DC | PRN

## 2024-02-25 MED ORDER — CYCLOBENZAPRINE HCL 10 MG PO TABS: 10.0000 mg | ORAL_TABLET | Freq: Three times a day (TID) | ORAL | 1 refills | Status: AC | PRN

## 2024-02-25 MED ORDER — LISINOPRIL 40 MG PO TABS: 40.0000 mg | ORAL_TABLET | Freq: Every day | ORAL | 0 refills | Status: DC

## 2024-02-25 MED ORDER — PREDNISONE 20 MG PO TABS: ORAL_TABLET | ORAL | 0 refills | Status: DC

## 2024-02-25 NOTE — Progress Notes (Signed)
 "  Subjective:    Patient ID: Gary Schultz, male    DOB: 1965-08-21, 59 y.o.   MRN: [id number]  Gary Schultz is a 59 y.o. male presenting on 02/25/2024 for Medical Management of Chronic Issues and Knee Pain   HPI  Discussed the use of AI scribe software for clinical note transcription with the patient, who gave verbal consent to proceed.  History of Present Illness   Gary Schultz is a 59 year old male with chronic back pain and knee issues who presents with worsening back pain and numbness in the foot.  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 He takes max 5 per day, 35 pills for 7 day course, he could not tolerate the 10mg  dosing we had him on before for transitional dose, post op knee pain has been severe. - Previously Orthopedics was prescribing Hydromorphone  oral 2mg  AS NEEDED for breakthrough pain only in conjunction with his baseline Oxycodone , he has now completed Hydromorphone  and this is discontinued - Gabapentin  included in regimen; causes drowsiness but does not alleviate pain   Physical therapy and non-pharmacologic pain management - Attending physical therapy through home  program; physical therapy exacerbates back pain - Performs physical therapy exercises at home, which are beneficial - Uses ice and heat therapy; heat application provides some relief for back pain  Knee pain and rehabilitation - Performs knee exercises three times daily, resulting in improved knee motion  - History of muscle cramps, previously managed with Flexeril , which was helpful especially at night for sleep  Hypertension Elevated BP reading due to pain now - Taking lisinopril  30 mg for blood pressure management - Interested in adjusted dose  Gastroesophageal reflux disease - Uses omeprazole  as needed for acid reflux, particularly after consuming spicy foods  Functional status and impact of musculoskeletal symptoms - Previous Physically demanding career in lobbyist, attributed to current musculoskeletal issues - Desires to return to activities such as swimming, which previously aided recovery           02/25/2024    8:05 AM 02/16/2024   12:46 PM 01/18/2024    1:50 PM  Depression screen PHQ 2/9  Decreased Interest 2 1 1   Down, Depressed, Hopeless 2 1 1   PHQ - 2 Score 4 2 2   Altered sleeping 3 2 1   Tired, decreased energy 2 1 1   Change in appetite 1 1 1   Feeling bad or failure about yourself  0 2 2  Trouble concentrating 1 1 1   Moving slowly or fidgety/restless 0 2 2  Suicidal thoughts 0 0 0  PHQ-9 Score 11 11 10   Difficult doing work/chores Extremely dIfficult Somewhat difficult Somewhat difficult       02/25/2024    8:05  AM 01/18/2024    1:51 PM 11/26/2023    9:22 AM 01/07/2023   10:10 AM  GAD 7 : Generalized Anxiety Score  Nervous, Anxious, on Edge 0 1 0 1  Control/stop worrying 0 2 0 1  Worry too much - different things 0 2 0 1  Trouble relaxing 2 2 0 1  Restless 1 2 0 1  Easily annoyed or irritable 0 2 0 1  Afraid - awful might happen 0 0 0 1  Total GAD 7 Score 3 11 0 7  Anxiety Difficulty Somewhat difficult Very difficult Not difficult at all  Somewhat difficult    Social History[1]  Review of Systems Per HPI unless specifically indicated above     Objective:    BP (!) 148/90 (BP Location: Left Arm, Cuff Size: Normal)   Pulse 90   Ht 6' (1.829 m)   Wt 193 lb 8 oz (87.8 kg)   SpO2 97%   BMI 26.24 kg/m   Wt Readings from Last 3 Encounters:  02/25/24 193 lb 8 oz (87.8 kg)  01/26/24 195 lb (88.5 kg)  01/20/24 195 lb (88.5 kg)    Physical Exam Vitals and nursing note reviewed.  Constitutional:      General: He is not in acute distress.    Appearance: Normal appearance. He is well-developed. He is not diaphoretic.     Comments: Well-appearing, discomfort from knee pain, cooperative  HENT:     Head: Normocephalic and atraumatic.  Eyes:     General:        Right eye: No discharge.        Left eye: No discharge.     Conjunctiva/sclera: Conjunctivae normal.  Cardiovascular:     Rate and Rhythm: Normal rate.  Pulmonary:     Effort: Pulmonary effort is normal.  Musculoskeletal:     Comments: Left knee with joint swelling and stiffness, limited range of motion, pain with transition  Skin:    General: Skin is warm and dry.     Findings: No erythema or rash.  Neurological:     Mental Status: He is alert and oriented to person, place, and time.  Psychiatric:        Mood and Affect: Mood normal.        Behavior: Behavior normal.        Thought Content: Thought content normal.     Comments: Well groomed, good eye contact, normal speech and thoughts     Results for orders placed or performed during the hospital encounter of 01/20/24  Surgical pcr screen   Collection Time: 01/20/24 11:09 AM   Specimen: Nasal Mucosa; Nasal Swab  Result Value Ref Range   MRSA, PCR NEGATIVE NEGATIVE   Staphylococcus aureus NEGATIVE NEGATIVE  CBC WITH DIFFERENTIAL   Collection Time: 01/20/24 11:09 AM  Result Value Ref Range   WBC 7.2 4.0 - 10.5 K/uL   RBC 5.11 4.22 - 5.81 MIL/uL   Hemoglobin 15.5 13.0 - 17.0 g/dL   HCT 53.7 60.9  - 47.9 %   MCV 90.4 80.0 - 100.0 fL   MCH 30.3 26.0 - 34.0 pg   MCHC 33.5 30.0 - 36.0 g/dL   RDW 88.2 88.4 - 84.4 %   Platelets 187 150 - 400 K/uL   nRBC 0.0 0.0 - 0.2 %   Neutrophils Relative % 68 %   Neutro Abs 4.9 1.7 - 7.7 K/uL   Lymphocytes Relative 20 %   Lymphs Abs 1.5 0.7 - 4.0 K/uL  Monocytes Relative 10 %   Monocytes Absolute 0.7 0.1 - 1.0 K/uL   Eosinophils Relative 1 %   Eosinophils Absolute 0.1 0.0 - 0.5 K/uL   Basophils Relative 1 %   Basophils Absolute 0.0 0.0 - 0.1 K/uL   Immature Granulocytes 0 %   Abs Immature Granulocytes 0.02 0.00 - 0.07 K/uL  Comprehensive metabolic panel   Collection Time: 01/20/24 11:09 AM  Result Value Ref Range   Sodium 141 135 - 145 mmol/L   Potassium 4.1 3.5 - 5.1 mmol/L   Chloride 105 98 - 111 mmol/L   CO2 29 22 - 32 mmol/L   Glucose, Bld 81 70 - 99 mg/dL   BUN 18 6 - 20 mg/dL   Creatinine, Ser 8.99 0.61 - 1.24 mg/dL   Calcium 8.7 (L) 8.9 - 10.3 mg/dL   Total Protein 6.2 (L) 6.5 - 8.1 g/dL   Albumin 4.2 3.5 - 5.0 g/dL   AST 18 15 - 41 U/L   ALT 17 0 - 44 U/L   Alkaline Phosphatase 73 38 - 126 U/L   Total Bilirubin 0.6 0.0 - 1.2 mg/dL   GFR, Estimated >39 >39 mL/min   Anion gap 7 5 - 15  Urinalysis, Routine w reflex microscopic -Urine, Clean Catch   Collection Time: 01/20/24 11:09 AM  Result Value Ref Range   Color, Urine YELLOW (A) YELLOW   APPearance CLEAR (A) CLEAR   Specific Gravity, Urine 1.019 1.005 - 1.030   pH 5.0 5.0 - 8.0   Glucose, UA NEGATIVE NEGATIVE mg/dL   Hgb urine dipstick NEGATIVE NEGATIVE   Bilirubin Urine NEGATIVE NEGATIVE   Ketones, ur NEGATIVE NEGATIVE mg/dL   Protein, ur NEGATIVE NEGATIVE mg/dL   Nitrite NEGATIVE NEGATIVE   Leukocytes,Ua NEGATIVE NEGATIVE      Assessment & Plan:   Problem List Items Addressed This Visit     Chronic pain of both knees - Primary   Relevant Medications   Oxycodone  HCl 20 MG TABS (Start on 02/29/2024)   predniSONE  (DELTASONE ) 20 MG tablet   cyclobenzaprine   (FLEXERIL ) 10 MG tablet   Chronic pain syndrome   Relevant Medications   Oxycodone  HCl 20 MG TABS (Start on 02/29/2024)   predniSONE  (DELTASONE ) 20 MG tablet   cyclobenzaprine  (FLEXERIL ) 10 MG tablet   Chronic radicular lumbar pain   Relevant Medications   Oxycodone  HCl 20 MG TABS (Start on 02/29/2024)   predniSONE  (DELTASONE ) 20 MG tablet   cyclobenzaprine  (FLEXERIL ) 10 MG tablet   Opioid dependence, uncomplicated (HCC)   Other Visit Diagnoses       Postoperative pain of left knee       Relevant Medications   Oxycodone  HCl 20 MG TABS (Start on 02/29/2024)   cyclobenzaprine  (FLEXERIL ) 10 MG tablet     Essential hypertension       Relevant Medications   lisinopril  (ZESTRIL ) 40 MG tablet     Muscle spasm of back       Relevant Medications   cyclobenzaprine  (FLEXERIL ) 10 MG tablet        Chronic pain syndrome with bilateral knee pain and postprocedural pain Primary issue is Left Knee pain post procedure S/p R TKR previuosly Pain is exacerbated by physical therapy.  See prior chart notes, he has a long history of opioid therapy and high pain tolerance - Continue oxycodone  20 mg as prescribed. Max 5 per 24 hours currently we are prescribing 35 pills for 7 day course - Discontinue Hydromorphone  from Orthopedics (this was breakthrough pain  only while he was on oxycodone  in post op) - Continue physical therapy sessions. - Avoid heat application to prevent swelling. - Use ice application to manage swelling. - Ordered Flexeril  for muscle relaxation, especially at night.  Opioid Dependence Signed Opioid Controlled substance agreement today for current dosing. We have had long discussions before and after his surgery regarding his opioid pain management. He understands goal is to transition to pain management clinic in future. I am temporarily managing his opioid therapy. - He is high risk for withdrawals so we are maintaining his dose to control pain  Lumbar radiculopathy with muscle  spasm Lumbar radiculopathy with muscle spasms causing numbness in foot. Scheduled for spinal injection with concerns about complications. High pain medication tolerance noted. Followed by Physiatry Dr Dodson - Proceed with scheduled spinal injection. - Use Flexeril  for muscle relaxation, especially at night.  Plan to initiate steroid taper, ensuring no overlap with spinal injection. - Ordered prednisone  taper. - Instructed to confirm timing of spinal injection to avoid overlap with prednisone  taper.   Essential hypertension Recent blood pressure 152/90 mmHg. Current lisinopril  30 mg regimen. Plan to increase lisinopril  to manage elevated blood pressure during increased pain. - Increased lisinopril  from 30mg  to 40 mg temporarily with high pain levels, if successful we can re order more, otherwise can adjust back if pain subsides - Monitor blood pressure regularly.  Gastroesophageal reflux disease Managed with omeprazole  as needed, particularly before spicy foods. - Continue omeprazole  as needed.         No orders of the defined types were placed in this encounter.   Meds ordered this encounter  Medications   Oxycodone  HCl 20 MG TABS    Sig: Take 1 tablet (20 mg total) by mouth every 4 (four) hours as needed for up to 7 days. Max of 5 per day    Dispense:  35 tablet    Refill:  0    First fill 02/29/2024. Note his surgeon has previously ordered Hydromorphone  for breakthrough pain. We are continuing Oxycodone  for chronic pain.   predniSONE  (DELTASONE ) 20 MG tablet    Sig: Take daily with food. Start with 60mg  (3 pills) x 2 days, then reduce to 40mg  (2 pills) x 2 days, then 20mg  (1 pill) x 3 days    Dispense:  13 tablet    Refill:  0   lisinopril  (ZESTRIL ) 40 MG tablet    Sig: Take 1 tablet (40 mg total) by mouth daily.    Dispense:  30 tablet    Refill:  0    Dose increase from 30 to 40mg    cyclobenzaprine  (FLEXERIL ) 10 MG tablet    Sig: Take 1 tablet (10 mg total) by mouth 3  (three) times daily as needed for muscle spasms.    Dispense:  90 tablet    Refill:  1    Follow up plan: Return in about 4 weeks (around 03/24/2024) for 4 weeks pain management.  Marsa Officer, DO [facility] 02/25/2024, 8:13 AM     [1]  Social History Tobacco Use   Smoking status: Never    Passive exposure: Never   Smokeless tobacco: Never  Vaping Use   Vaping status: Never Used  Substance Use Topics   Alcohol use: Not Currently    Comment: occassional   Drug use: Yes    Comment: prescribed oxy 20mg  and muscle relaxers   "

## 2024-02-25 NOTE — Patient Instructions (Addendum)
 Thank you for coming to the office today.  Dose inc Lisinopril  from 30 mg to 40 mg - 30 pills 0 refills for now just to try monitor BP  Re ordered Oxycodone  20mg  pick up Monday 02/29/24  Prednisone  taper ordered, caution with other steroid injections coming up, check on dates first.  Start Cyclobenzapine (Flexeril ) 10mg  tablets (muscle relaxant) - cut in half for 5mg  at night for muscle relaxant - may make you sedated or sleepy (be careful driving or working on this) if tolerated you can take half to whole tab 2 to 3 times daily or every 8 hours as needed   Please schedule a Follow-up Appointment to: Return in about 4 weeks (around 03/24/2024) for 4 weeks pain management.  If you have any other questions or concerns, please feel free to call the office or send a message through MyChart. You may also schedule an earlier appointment if necessary.  Additionally, you may be receiving a survey about your experience at our office within a few days to 1 week by e-mail or mail. We value your feedback.  Marsa Officer, DO [facility], [place]

## 2024-03-01 ENCOUNTER — Other Ambulatory Visit: Payer: Self-pay | Admitting: Family Medicine

## 2024-03-01 DIAGNOSIS — R202 Paresthesia of skin: Secondary | ICD-10-CM

## 2024-03-02 NOTE — Telephone Encounter (Signed)
 Requested by interface surescripts. Medication discontinued 01/18/24.  Requested Prescriptions  Refused Prescriptions Disp Refills   gabapentin  (NEURONTIN ) 600 MG tablet [Pharmacy Med Name: GABAPENTIN  600 MG TABLET] 270 tablet 1    Sig: 1 TABLET ORALLY 3 TIMES A DAY NUMBNESS/TINGLING 90 DAYS     Neurology: Anticonvulsants - gabapentin  Passed - 03/02/2024  2:35 PM      Passed - Cr in normal range and within 360 days    Creat  Date Value Ref Range Status  01/01/2024 0.85 0.70 - 1.30 mg/dL Final   Creatinine, Ser  Date Value Ref Range Status  01/20/2024 1.00 0.61 - 1.24 mg/dL Final         Passed - Completed PHQ-2 or PHQ-9 in the last 360 days      Passed - Valid encounter within last 12 months    Recent Outpatient Visits           6 days ago Chronic pain of both knees   [place] [facility] [place], Marsa PARAS, DO   2 weeks ago Postoperative pain of left knee   [place] [facility] [place] Marsa PARAS, DO   1 month ago Annual physical exam   [place] [facility] [place] Marsa PARAS, DO   2 months ago Acute non-recurrent frontal sinusitis   [place] [facility] [place], Marsa PARAS, DO   3 months ago Chronic pain of left knee   [place] [facility] [place], Angeline ORN, NP       Future Appointments             In 1 month [place], Clotilda DELENA RIGGERS [facility] [place]   In 3 months Hester Alm BROCKS, MD [facility]

## 2024-03-03 ENCOUNTER — Encounter: Payer: Self-pay | Admitting: Student in an Organized Health Care Education/Training Program

## 2024-03-03 ENCOUNTER — Ambulatory Visit
Attending: Student in an Organized Health Care Education/Training Program | Admitting: Student in an Organized Health Care Education/Training Program

## 2024-03-03 VITALS — BP 142/101 | HR 87 | Temp 97.6°F | Resp 16

## 2024-03-03 DIAGNOSIS — M5416 Radiculopathy, lumbar region: Secondary | ICD-10-CM | POA: Diagnosis present

## 2024-03-03 DIAGNOSIS — G8929 Other chronic pain: Secondary | ICD-10-CM | POA: Diagnosis present

## 2024-03-03 DIAGNOSIS — G894 Chronic pain syndrome: Secondary | ICD-10-CM | POA: Diagnosis present

## 2024-03-03 NOTE — Progress Notes (Signed)
 Safety precautions to be maintained throughout the outpatient stay will include: orient to surroundings, keep bed in low position, maintain call bell within reach at all times, provide assistance with transfer out of bed and ambulation.

## 2024-03-03 NOTE — Progress Notes (Signed)
 PROVIDER NOTE: Interpretation of information contained herein should be left to medically-trained personnel. Specific patient instructions are provided elsewhere under Patient Instructions section of medical record. This document was created in part using AI and STT-dictation technology, any transcriptional errors that may result from this process are unintentional.  Patient: Gary Schultz  Service: E/M   PCP: Edman Marsa PARAS, DO  DOB: 1965/10/05  DOS: 03/03/2024  Provider: Wallie Sherry, MD  MRN: [id number]  Delivery: Face-to-face  Specialty: Interventional Pain Management  Type: Established Patient  Setting: Ambulatory outpatient facility  Specialty designation: 09  Referring Prov.: Edman Marsa PARAS, DO  Location: Outpatient office facility       History of present illness (HPI) Mr. Gary Schultz, a 59 y.o. year old male, is here today because of his Lumbar radiculopathy [M54.16]. Mr. Gary Schultz primary complain today is Back Pain (Lumbar left ) and Knee Pain (Left s/p joint replacment in December 25)  Pertinent problems: Gary Schultz does not have any pertinent problems on file.  Pain Assessment: Severity of Chronic pain is reported as a 7 /10. Location: Back Lower, Left/down the left leg to the foot. outside and posterior of knee. Onset: More than a month ago. Quality: Discomfort, Burning, Dull, Aching, Nagging, Constant. Timing: Constant. Modifying factor(s): ice, pain medication. Vitals:  temperature is 97.6 F (36.4 C). His blood pressure is 142/101 (abnormal) and his pulse is 87. His respiration is 16 and oxygen saturation is 99%.  BMI: Estimated body mass index is 26.24 kg/m as calculated from the following:   Height as of 02/25/24: 6' (1.829 m).   Weight as of 02/25/24: 193 lb 8 oz (87.8 kg).  Last encounter: 06/23/2023. Last procedure: 07/08/2023.  Reason for encounter:  History of Present Illness   Gary Schultz is a 59 year old male with chronic back and  knee pain who presents with worsening nerve pain and sleep disturbances.  He experiences worsening nerve pain, more severe than his knee pain, primarily located in his back and radiating to his legs. This pain causes significant discomfort and sleep disturbances, particularly severe at night, waking him up and preventing more than two hours of sleep per night for the past month. Current medications include 20 mg oxycodone , 600 mg gabapentin  three times a day, and Diletta, but he has not provided relief. He has previously tried Lyrica and Cymbalta but experienced adverse side effects.  He has a history of right knee replacement and experiences breakthrough pain that hinders his ability to complete physical therapy. Despite this, he continues physical therapy exercises at home and has achieved good motion in his knee. He uses ice, heat, and lidocaine  patches to manage swelling and pain.  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 He has degenerative disc disease with moderate bulging discs at L5-S1. He experiences numbness in his foot when standing for prolonged periods and pain radiating from his ankle to his leg, with the left side more affected.  He has a significant history of mechanical work, contributing to his back issues. He previously worked in a tax adviser and has a history of lifting heavy objects, which he believes has exacerbated his condition. He wants to return to work and discontinue disability benefits, which he finds financially limiting.  Of note, the patient previously had an epidural injection with me at L3-L4 and 07/08/2023.  He does have a previous L4-L5 laminotomy.  ROS  Constitutional: Denies any fever or chills Gastrointestinal: No reported hemesis, hematochezia, vomiting, or acute GI distress Musculoskeletal: Denies any acute onset joint swelling, redness, loss of ROM,  or weakness Neurological: Bilateral leg pain  Medication Review  HYDROmorphone , Oxycodone  HCl, apixaban , cyclobenzaprine , diclofenac  Sodium, fluticasone , gabapentin , lidocaine , lisinopril , loratadine, omeprazole , predniSONE , rOPINIRole , and testosterone  cypionate  History Review  Allergy: Gary Schultz is allergic to tylenol  [acetaminophen ]. Drug: Gary Schultz  reports current drug use. Alcohol:  reports that he does not currently use alcohol. Tobacco:  reports that he has never smoked. He has never been exposed to tobacco smoke. He has never used smokeless tobacco. Social: Gary Schultz  reports that he has never smoked. He has never been exposed to tobacco smoke. He has never used smokeless tobacco. He reports that he does not currently use alcohol. He reports current drug use. Medical:  has a past medical history of Anxiety, Arthritis, Atherosclerosis of right carotid artery (01/20/2019), BPH associated with nocturia (05/16/2020), Carpal tunnel syndrome, Cervical facet syndrome (2016), DDD (degenerative disc disease), lumbar, Depression, Generalized headaches, GERD (gastroesophageal reflux disease), Hypertension, Neck pain, Neuromuscular disorder (HCC), Post concussion syndrome (2021), Tear of meniscus of knee joint (2017), and Varicose veins. Surgical: Gary Schultz  has a past surgical history that includes Shoulder surgery (Right, 1992); Knee arthroscopy with medial menisectomy (Left, 09/28/2018); Knee arthroscopy with medial menisectomy (Right, 12/07/2018); Repair of peroneus brevis tendon (Left, 03/29/2019); Carpal tunnel release (Right, 12/22/2019); Back surgery (2006); Clavicle surgery (Right); Fracture surgery (Left, 2019); Carpal tunnel release (Left, 02/29/2020); Colonoscopy with propofol  (N/A, 06/08/2020); Total knee arthroplasty (Right, 04/22/2023); and Total knee arthroplasty (Left, 01/26/2024). Family: family history includes Cancer in his father and mother; Colon cancer in his father; Hypertension  in his father; Stroke in his father; Varicose Veins in his mother.  Laboratory Chemistry Profile   Renal Lab Results  Component Value Date   BUN 18 01/20/2024   CREATININE 1.00 01/20/2024   BCR SEE NOTE: 01/01/2024   GFRAA >60 03/29/2019   GFRNONAA >60 01/20/2024    Hepatic Lab Results  Component Value Date   AST 18 01/20/2024   ALT 17 01/20/2024   ALBUMIN 4.2 01/20/2024   ALKPHOS 73 01/20/2024    Electrolytes Lab Results  Component Value Date   NA 141 01/20/2024   K 4.1 01/20/2024   CL 105 01/20/2024   CALCIUM 8.7 (L) 01/20/2024    Bone Lab Results  Component Value Date   VD25OH 50 02/05/2022   TESTOFREE 0.3 (L) 04/09/2022   TESTOSTERONE  134 (L) 09/30/2023    Inflammation (CRP: Acute Phase) (ESR: Chronic Phase) No results found for: CRP, ESRSEDRATE, LATICACIDVEN       Note: Above Lab results reviewed.  Recent Imaging Review  DG Knee Left Port CLINICAL DATA:  Status post knee replacement.  EXAM: PORTABLE LEFT KNEE - 1-2 VIEW  COMPARISON:  None Available.  FINDINGS: Left knee arthroplasty in expected alignment. No periprosthetic fracture. Recent postsurgical change includes air and edema in the soft tissues and joint space. Anterior skin staples in place.  IMPRESSION: Left knee arthroplasty.  Electronically Signed   By: Andrea Gasman M.D.   On: 01/26/2024 11:15   CLINICAL DATA:  Lumbar radiculitis. Chronic low back pain radiating down both legs with numbness and tingling in the feet. Remote history of lumbar surgery.   EXAM: MRI LUMBAR SPINE WITHOUT CONTRAST   TECHNIQUE: Multiplanar, multisequence MR imaging of the lumbar spine was performed. No intravenous contrast was administered.   COMPARISON:  Lumbar spine MRI 04/21/2022   FINDINGS: Segmentation:  Standard.   Alignment:  Normal.   Vertebrae: No fracture, suspicious marrow lesion, or evidence of discitis. Mild Modic type 1 endplate changes at L4-5 and mild Modic type 2  changes at L4-5 and L5-S1. Small hemangioma in the L1 vertebral body.   Conus medullaris and cauda equina: Conus extends to the L1-2 level. Conus and cauda equina appear normal.   Paraspinal and other soft tissues: Unremarkable.   Disc levels:   Disc desiccation throughout the lumbar spine with moderate disc space narrowing at L4-5 and L5-S1 and mild narrowing elsewhere.   T12-L1: Minimal disc bulging without stenosis.   L1-2: Mild facet hypertrophy and at most minimal disc bulging result in borderline to mild left lateral recess stenosis without spinal or neural foraminal stenosis, unchanged.   L2-3: Mild disc bulging and mild facet hypertrophy without stenosis, unchanged.   L3-4: Disc bulging and mild facet hypertrophy result in mild right and borderline left lateral recess stenosis and mild bilateral neural foraminal stenosis without spinal stenosis, unchanged.   L4-5: Remote right laminotomy. Disc bulging and mild facet hypertrophy result in mild bilateral neural foraminal stenosis, stable to slightly progressed. No spinal stenosis.   L5-S1: Left eccentric disc bulging results in mild left neural foraminal stenosis without spinal stenosis, unchanged.   IMPRESSION: 1. Multilevel lumbar disc and facet degeneration without significant interval change or spinal stenosis. 2. Mild neural foraminal stenosis at L3-4, L4-5, and L5-S1.     Electronically Signed   By: Dasie Hamburg M.D.   On: 05/25/2023 09:17      Note: Reviewed        Physical Exam  Vitals: BP (!) 142/101 (Patient Position: Sitting, Cuff Size: Normal)   Pulse 87   Temp 97.6 F (36.4 C)   Resp 16   SpO2 99%  BMI: Estimated body mass index is 26.24 kg/m as calculated from the following:   Height as of 02/25/24: 6' (1.829 m).   Weight as of 02/25/24: 193 lb 8 oz (87.8 kg). Ideal: Ideal body weight: 77.6 kg (171 lb 1.2 oz) Adjusted ideal body weight: 81.7 kg (180 lb 0.7 oz) General appearance: Well  nourished, well developed, and well hydrated. In no apparent acute distress Mental status: Alert, oriented x 3 (person, place, & time)       Respiratory: No evidence of acute respiratory distress Eyes: PERLA  Lumbar Spine Area Exam  Skin & Axial Inspection: Well healed scar from previous spine surgery detected Alignment: Symmetrical Functional ROM: Pain restricted ROM affecting both sides Stability: No instability detected Muscle Tone/Strength: Functionally intact. No obvious neuro-muscular anomalies detected. Sensory (Neurological): Dermatomal pain pattern Palpation: No palpable anomalies       Provocative Tests: Hyperextension/rotation test: deferred today       Lumbar quadrant test (Kemp's test): (+) bilateral for foraminal stenosis Lateral bending test: (+) ipsilateral radicular pain, bilaterally. Positive for bilateral foraminal stenosis.  L>R   Gait & Posture Assessment  Ambulation: Unassisted Gait: Relatively normal for age and body habitus Posture: WNL  Lower Extremity Exam      Side: Right lower extremity   Side: Left lower extremity  Stability: No instability observed           Stability: No instability observed          Skin & Extremity Inspection: Skin color, temperature, and hair growth are WNL. No peripheral edema or cyanosis. No masses, redness, swelling, asymmetry, or associated skin lesions. No contractures.  Skin & Extremity Inspection: Skin color, temperature, and hair growth are WNL. No peripheral edema or cyanosis. No masses, redness, swelling, asymmetry, or associated skin lesions. No contractures.  Functional ROM: Unrestricted ROM                   Functional ROM: Unrestricted ROM                  Muscle Tone/Strength: Functionally intact. No obvious neuro-muscular anomalies detected.   Muscle Tone/Strength: Functionally intact. No obvious neuro-muscular anomalies detected.  Sensory (Neurological): Unimpaired         Sensory (Neurological): Unimpaired         DTR: Patellar: deferred today Achilles: deferred today Plantar: deferred today   DTR: Patellar: deferred today Achilles: deferred today Plantar: deferred today  Palpation: No palpable anomalies   Palpation: No palpable anomalies    Assessment   Diagnosis  1. Lumbar radiculopathy   2. Chronic radicular lumbar pain   3. Chronic pain syndrome      Updated Problems: No problems updated.  Plan of Care  Problem-specific:  Assessment and Plan    Lumbar radiculopathy   Chronic lumbar radiculopathy causes pain radiating from the lower back to the legs, primarily on the left side but also affecting the right. The pain is dull, sharp, and irritating, worsened by lying down and prolonged standing. MRI reveals moderate bulging discs at L5-S1 with some degeneration. Previous epidural injections. Current pain management with oxycodone  and gabapentin  is inadequate. He prefers to avoid spinal cord stimulation due to concerns about implants. Plan to perform epidural injections, transforaminal at L5-S1 bilaterally with sedation.  Chronic pain syndrome   Chronic pain syndrome is secondary to lumbar radiculopathy and past injuries, including knee, ankle, and back issues. Pain management is challenging due to inadequate medication response and previous negative experiences with providers. The current regimen of oxycodone  and gabapentin  provides partial relief. He is motivated to manage pain to return to work and avoid disability. Continue current pain management regimen with PCP, advised to wean off Oxycodone  as pain improves, and schedule epidural injections as planned for lumbar radiculopathy.       Gary Schultz has a current medication list which includes the following long-term medication(s): fluticasone , lisinopril , omeprazole , ropinirole , testosterone  cypionate, and apixaban .  Pharmacotherapy (Medications Ordered): No orders of the defined types were placed in this encounter.  Orders:   Orders Placed This Encounter  Procedures   Lumbar Transforaminal Epidural    Standing Status:   Future    Expiration Date:   06/01/2024    Scheduling Instructions:     Laterality: Bilateral     Level(s): L5 and S1           Sedation: Patient's choice.     Timeframe: As soon as schedule allows.    Where will this procedure be performed?:   [facility]     Right L3-4 ESI 07/08/22   Return in about 18 days (around 03/21/2024) for B/L L5 and S1 TF ESI, in clinic IV Versed .    Recent Visits No visits were found meeting these conditions. Showing recent visits within past 90 days and meeting all other requirements Today's Visits Date Type Provider Dept  03/03/24 Office Visit Marcelino Nurse, MD [facility]  Showing today's visits and meeting all other requirements Future Appointments Date Type Provider Dept  03/21/24 Appointment Marcelino Nurse, MD [facility]  Showing future appointments within next 90 days and meeting all  other requirements  I discussed the assessment and treatment plan with the patient. The patient was provided an opportunity to ask questions and all were answered. The patient agreed with the plan and demonstrated an understanding of the instructions.  Patient advised to call back or seek an in-person evaluation if the symptoms or condition worsens.  I personally spent a total of 30 minutes in the care of the patient today including preparing to see the patient, getting/reviewing separately obtained history, performing a medically appropriate exam/evaluation, counseling and educating, placing orders, and documenting clinical information in the EHR.   Note by: Wallie Sherry, MD (TTS and AI technology used. I apologize for any typographical errors that were not detected and corrected.) Date: 03/03/2024; Time: 3:03 PM

## 2024-03-03 NOTE — Patient Instructions (Signed)

## 2024-03-06 ENCOUNTER — Other Ambulatory Visit: Payer: Self-pay | Admitting: Family Medicine

## 2024-03-06 DIAGNOSIS — J0110 Acute frontal sinusitis, unspecified: Secondary | ICD-10-CM

## 2024-03-07 ENCOUNTER — Other Ambulatory Visit: Payer: Self-pay | Admitting: Student

## 2024-03-07 ENCOUNTER — Other Ambulatory Visit: Payer: Self-pay | Admitting: Family Medicine

## 2024-03-07 DIAGNOSIS — S43432A Superior glenoid labrum lesion of left shoulder, initial encounter: Secondary | ICD-10-CM

## 2024-03-07 DIAGNOSIS — G8929 Other chronic pain: Secondary | ICD-10-CM

## 2024-03-07 DIAGNOSIS — G8918 Other acute postprocedural pain: Secondary | ICD-10-CM

## 2024-03-07 DIAGNOSIS — M7582 Other shoulder lesions, left shoulder: Secondary | ICD-10-CM

## 2024-03-07 DIAGNOSIS — G894 Chronic pain syndrome: Secondary | ICD-10-CM

## 2024-03-07 DIAGNOSIS — M7522 Bicipital tendinitis, left shoulder: Secondary | ICD-10-CM

## 2024-03-07 MED ORDER — OXYCODONE HCL 20 MG PO TABS: 20.0000 mg | ORAL_TABLET | ORAL | 0 refills | Status: DC | PRN

## 2024-03-07 NOTE — Telephone Encounter (Signed)
 Requested Prescriptions  Pending Prescriptions Disp Refills   fluticasone  (FLONASE ) 50 MCG/ACT nasal spray [Pharmacy Med Name: FLUTICASONE  PROP 50 MCG SPRAY] 48 mL 1    Sig: PLACE 2 SPRAYS IN BOTH NOSTRILS DAILY FOR 4-6 WEEKS THEN STOP AND USE SEASONALLY OR AS NEEDED.     Ear, Nose, and Throat: Nasal Preparations - Corticosteroids Passed - 03/07/2024  2:44 PM      Passed - Valid encounter within last 12 months    Recent Outpatient Visits           1 week ago Chronic pain of both knees   [place] [facility] [place], Marsa PARAS, DO   2 weeks ago Postoperative pain of left knee   [place] [facility] Edman Marsa PARAS, DO   1 month ago Annual physical exam   [place] [facility] Edman Marsa PARAS, DO   2 months ago Acute non-recurrent frontal sinusitis   [place] Edman Marsa PARAS, DO   3 months ago Chronic pain of left knee   [place] [facility] [place], Angeline ORN, NP       Future Appointments             In 1 month McG[place]an, Clotilda DELENA RIGGERS [facility] [place]   In 3 months Hester Alm BROCKS, MD [facility]

## 2024-03-07 NOTE — Telephone Encounter (Signed)
 Copied from CRM [id number]. Topic: Clinical - Medication Refill >> Mar 07, 2024 10:23 AM Treva T wrote: Medication:  Oxycodone  HCl 20 MG TABS   Has the patient contacted their pharmacy? Yes   This is the patient's preferred pharmacy:  [id number] [place], [place] - [address] AT [facility] [address] [place] [address] Phone: [phone] Fax: [phone]  Is this the correct pharmacy for this prescription? Yes   Has the prescription been filled recently? Yes  Is the patient out of the medication? Yes  Has the patient been seen for an appointment in the last year OR does the patient have an upcoming appointment? Yes  Can we respond through MyChart? Yes  Agent: Please be advised that Rx refills may take up to 3 business days. We ask that you follow-up with your pharmacy.

## 2024-03-10 ENCOUNTER — Other Ambulatory Visit: Payer: Self-pay | Admitting: Student

## 2024-03-10 DIAGNOSIS — M7522 Bicipital tendinitis, left shoulder: Secondary | ICD-10-CM

## 2024-03-10 DIAGNOSIS — S43432A Superior glenoid labrum lesion of left shoulder, initial encounter: Secondary | ICD-10-CM

## 2024-03-10 DIAGNOSIS — G8929 Other chronic pain: Secondary | ICD-10-CM

## 2024-03-10 DIAGNOSIS — M7582 Other shoulder lesions, left shoulder: Secondary | ICD-10-CM

## 2024-03-14 ENCOUNTER — Ambulatory Visit: Payer: Self-pay | Admitting: Family Medicine

## 2024-03-14 ENCOUNTER — Other Ambulatory Visit: Payer: Self-pay

## 2024-03-14 DIAGNOSIS — G8918 Other acute postprocedural pain: Secondary | ICD-10-CM

## 2024-03-14 DIAGNOSIS — G8929 Other chronic pain: Secondary | ICD-10-CM

## 2024-03-14 DIAGNOSIS — G894 Chronic pain syndrome: Secondary | ICD-10-CM

## 2024-03-14 NOTE — Telephone Encounter (Signed)
 FYI Only or Action Required?: FYI only for provider: will process refill request.  Patient was last seen in primary care on 02/25/2024 by Edman Marsa PARAS, DO.  Called Nurse Triage reporting Medication Refill.- Oxycodone   Triage Disposition: Home Care  Patient/caregiver understands and will follow disposition?: Yes  Reason for Disposition  [1] Caller requesting a prescription renewal (no refills left), no triage required, AND [2] triager able to renew prescription per department policy  Answer Assessment - Initial Assessment Questions DRUG NAME: What medicine do you need to have refilled?     Oxycodone - RN calls to assesses for any new or worsening pain.   SYMPTOMS: Do you have any symptoms?     No new symptoms or worsening of pain.  Is on a 7 day regimen/ pain contract with provider for this medication and notes winter weather has delayed typical process.   Triage RN will process refill request per typical process/ protocol.  No further needs at this time.  Protocols used: Medication Refill and Renewal Call-A-AH

## 2024-03-14 NOTE — Telephone Encounter (Signed)
 Copied from CRM #[id number]. Topic: Clinical - Medication Refill >> Mar 14, 2024  4:18 PM Hadassah PARAS wrote: Medication: Oxycodone  HCl 20 MG TABS   Has the patient contacted their pharmacy? Yes (Agent: If no, request that the patient contact the pharmacy for the refill. If patient does not wish to contact the pharmacy document the reason why and proceed with request.) (Agent: If yes, when and what did the pharmacy advise?)  This is the patient's preferred pharmacy:  [id number] [place], [place] - [address] AT [facility] [address] [place] [address] Phone: [phone] Fax: [phone]  Is this the correct pharmacy for this prescription? Yes If no, delete pharmacy and type the correct one.   Has the prescription been filled recently? Yes  Is the patient out of the medication? Yes  Has the patient been seen for an appointment in the last year OR does the patient have an upcoming appointment? Yes  Can we respond through MyChart? Yes  Agent: Please be advised that Rx refills may take up to 3 business days. We ask that you follow-up with your pharmacy.

## 2024-03-15 ENCOUNTER — Telehealth: Payer: Self-pay

## 2024-03-15 ENCOUNTER — Other Ambulatory Visit: Payer: Self-pay | Admitting: Urology

## 2024-03-15 DIAGNOSIS — E291 Testicular hypofunction: Secondary | ICD-10-CM

## 2024-03-15 MED ORDER — OXYCODONE HCL 20 MG PO TABS: 20.0000 mg | ORAL_TABLET | ORAL | 0 refills | Status: DC | PRN

## 2024-03-15 NOTE — Telephone Encounter (Signed)
 Called patient and pharmacy. See notes. Due to hydromorphone  we had to delay fill of Oxycodone  to 03/17/24. Patient understood. Rx sent

## 2024-03-15 NOTE — Telephone Encounter (Signed)
 [facility] and discussed case with pharmacist. Patient hit overall MME dose based on quantity of both short acting opioids Hydromorphone  from Dr Edie (breakthrough pain) and Oxycodone  from me for chronic pain. Quantity limit of both within 30 days was reached. He cannot fill Oxycodone  until current Hydromorphone  rx has been 7 days since picked up. Last pick up hydromorphone  1/22. Next fill Oxycodone  1/29, new rx sent. No further Hydromorphone  can be filled at this time.  I called patient and he agrees and understands. He says Hydromorphone  not effective anymore. He will discontinue.  Marsa Officer, DO [facility] 03/15/2024, 10:36 AM

## 2024-03-15 NOTE — Telephone Encounter (Signed)
 Copied from CRM [id number]. Topic: Clinical - Medication Question >> Mar 15, 2024  9:14 AM Jasmin G wrote: Reason for CRM: [id number] [address] [place] - [address] AT [facility] requested a call back at [phone] to inform Mr. Edman team that the combination of HYDROmorphone  (DILAUDID ) 2 MG tablet and oxycodone  was flagged and pt's mme levels are over 90.

## 2024-03-17 ENCOUNTER — Ambulatory Visit
Admission: RE | Admit: 2024-03-17 | Discharge: 2024-03-17 | Disposition: A | Discharge status: Home / Self Care | Source: Ambulatory Visit | Attending: Student | Admitting: Student

## 2024-03-17 DIAGNOSIS — G8929 Other chronic pain: Secondary | ICD-10-CM

## 2024-03-17 DIAGNOSIS — S43432A Superior glenoid labrum lesion of left shoulder, initial encounter: Secondary | ICD-10-CM | POA: Diagnosis not present

## 2024-03-17 DIAGNOSIS — S46112A Strain of muscle, fascia and tendon of long head of biceps, left arm, initial encounter: Secondary | ICD-10-CM | POA: Diagnosis not present

## 2024-03-17 DIAGNOSIS — M7522 Bicipital tendinitis, left shoulder: Secondary | ICD-10-CM | POA: Diagnosis not present

## 2024-03-17 DIAGNOSIS — M7582 Other shoulder lesions, left shoulder: Secondary | ICD-10-CM | POA: Insufficient documentation

## 2024-03-17 DIAGNOSIS — M25512 Pain in left shoulder: Secondary | ICD-10-CM | POA: Insufficient documentation

## 2024-03-17 DIAGNOSIS — X58XXXA Exposure to other specified factors, initial encounter: Secondary | ICD-10-CM | POA: Diagnosis not present

## 2024-03-17 MED ORDER — GADOBUTROL 1 MMOL/ML IV SOLN
2.0000 mL | Freq: Once | INTRAVENOUS | Status: AC | PRN
  Administered 2024-03-17: 1 mL

## 2024-03-17 MED ORDER — IOHEXOL 180 MG/ML  SOLN
10.0000 mL | Freq: Once | INTRAMUSCULAR | Status: AC | PRN
  Administered 2024-03-17: 15 mL

## 2024-03-18 ENCOUNTER — Other Ambulatory Visit: Payer: Self-pay | Admitting: Urology

## 2024-03-18 ENCOUNTER — Other Ambulatory Visit: Payer: Self-pay | Admitting: Family Medicine

## 2024-03-18 DIAGNOSIS — I10 Essential (primary) hypertension: Secondary | ICD-10-CM

## 2024-03-18 NOTE — Telephone Encounter (Signed)
 Requested Prescriptions  Pending Prescriptions Disp Refills   lisinopril  (ZESTRIL ) 40 MG tablet [Pharmacy Med Name: LISINOPRIL  40 MG TABLET] 90 tablet 1    Sig: TAKE 1 TABLET BY MOUTH EVERY DAY     Cardiovascular:  ACE Inhibitors Failed - 03/18/2024  5:50 PM      Failed - Last BP in normal range    BP Readings from Last 1 Encounters:  03/03/24 (!) 142/101         Passed - Cr in normal range and within 180 days    Creat  Date Value Ref Range Status  01/01/2024 0.85 0.70 - 1.30 mg/dL Final   Creatinine, Ser  Date Value Ref Range Status  01/20/2024 1.00 0.61 - 1.24 mg/dL Final         Passed - K in normal range and within 180 days    Potassium  Date Value Ref Range Status  01/20/2024 4.1 3.5 - 5.1 mmol/L Final         Passed - Patient is not pregnant      Passed - Valid encounter within last 6 months    Recent Outpatient Visits           3 weeks ago Chronic pain of both knees   [place] [facility] Edman Marsa PARAS, DO   1 month ago Postoperative pain of left knee   [place] [facility] Edman Marsa PARAS, DO   2 months ago Annual physical exam   [place] [facility] Edman Marsa PARAS, DO   2 months ago Acute non-recurrent frontal sinusitis   [place] [facility] [place], Marsa PARAS, DO   3 months ago Chronic pain of left knee   [place] [facility] [place], Angeline ORN, NP       Future Appointments             In 2 weeks [place], Clotilda DELENA RIGGERS [facility] [place]   In 2 months Hester Alm BROCKS, MD [facility]

## 2024-03-21 ENCOUNTER — Ambulatory Visit: Admitting: Student in an Organized Health Care Education/Training Program

## 2024-03-24 ENCOUNTER — Ambulatory Visit: Admitting: Family Medicine

## 2024-03-24 ENCOUNTER — Encounter: Payer: Self-pay | Admitting: Family Medicine

## 2024-03-24 VITALS — BP 122/78 | HR 94 | Ht 72.0 in | Wt 189.4 lb

## 2024-03-24 DIAGNOSIS — G8918 Other acute postprocedural pain: Secondary | ICD-10-CM

## 2024-03-24 DIAGNOSIS — M1712 Unilateral primary osteoarthritis, left knee: Secondary | ICD-10-CM

## 2024-03-24 DIAGNOSIS — S46002A Unspecified injury of muscle(s) and tendon(s) of the rotator cuff of left shoulder, initial encounter: Secondary | ICD-10-CM

## 2024-03-24 DIAGNOSIS — F1120 Opioid dependence, uncomplicated: Secondary | ICD-10-CM

## 2024-03-24 DIAGNOSIS — G8929 Other chronic pain: Secondary | ICD-10-CM

## 2024-03-24 DIAGNOSIS — G894 Chronic pain syndrome: Secondary | ICD-10-CM

## 2024-03-24 MED ORDER — OXYCODONE HCL 20 MG PO TABS: 20.0000 mg | ORAL_TABLET | ORAL | 0 refills | Status: AC | PRN

## 2024-03-24 NOTE — Progress Notes (Signed)
 "  Subjective:    Patient ID: Gary Schultz, male    DOB: [birth year], 59 y.o.   MRN: [id number]  Gary Schultz is a 59 y.o. male presenting on 03/24/2024 for Medical Management of Chronic Issues   HPI  Discussed the use of AI scribe software for clinical note transcription with the patient, who gave verbal consent to proceed.  History of Present Illness   Gary Schultz is a 59 year old male with arthritis and nerve pain who presents with ongoing knee pain and shoulder issues post-surgery.  Postoperative knee pain and neuropathy Followed by Maryl Beers Dr Edie and team - Persistent nerve pain in the knee two months after surgery - Discontinued Dilaudid ; currently managing pain with gabapentin , Flexeril , and oxycodone  - Significant discomfort from scar tissue on nerves, especially at night - Participates in physical therapy at home and at the [facility] - Pain increases at night, affecting sleep; uses recliner and heat pad for relief - Currently taking oxycodone  20 mg, effective for severe pain and manages any withdrawals. He takes max 5 per day, 35 pills for 7 day course, asking for adjusted amount for 2 week supply - Gabapentin  included in regimen; causes drowsiness but does not alleviate pain  Left shoulder pain and dysfunction - Recent MRI with contrast revealed at least a partial tear of the supraspinatus tendon and possible rupture of the long head of the biceps - Pain with lifting weights and physical activity, including swimming - Shoulder pain exacerbated by activity, but remains physically active  Restless legs syndrome - Alternates between gabapentin  and ropinirole  for restless leg symptoms  Functional status and impact on daily life - Remains active with household chores and cutting wood, which he believes aids recovery - Ongoing pain impacts ability to assist wife and maintain home - Expresses frustration with persistent pain and its effect on daily  activities             02/25/2024    8:05 AM 02/16/2024   12:46 PM 01/18/2024    1:50 PM  Depression screen PHQ 2/9  Decreased Interest 2 1 1   Down, Depressed, Hopeless 2 1 1   PHQ - 2 Score 4 2 2   Altered sleeping 3 2 1   Tired, decreased energy 2 1 1   Change in appetite 1 1 1   Feeling bad or failure about yourself  0 2 2  Trouble concentrating 1 1 1   Moving slowly or fidgety/restless 0 2 2  Suicidal thoughts 0 0 0  PHQ-9 Score 11 11 10   Difficult doing work/chores Extremely dIfficult Somewhat difficult Somewhat difficult       02/25/2024    8:05 AM 01/18/2024    1:51 PM 11/26/2023    9:22 AM 01/07/2023   10:10 AM  GAD 7 : Generalized Anxiety Score  Nervous, Anxious, on Edge 0  1  0  1   Control/stop worrying 0  2  0  1   Worry too much - different things 0  2  0  1   Trouble relaxing 2  2  0  1   Restless 1  2  0  1   Easily annoyed or irritable 0  2  0  1   Afraid - awful might happen 0  0  0  1   Total GAD 7 Score 3 11 0 7  Anxiety Difficulty Somewhat difficult Very difficult Not difficult at all Somewhat difficult     Data saved with  a previous flowsheet row definition    Social History[1]  Review of Systems Per HPI unless specifically indicated above     Objective:    BP 122/78 (BP Location: Left Arm, Patient Position: Sitting, Cuff Size: Normal)   Pulse 94   Ht 6' (1.829 m)   Wt 189 lb 6 oz (85.9 kg)   SpO2 97%   BMI 25.68 kg/m   Wt Readings from Last 3 Encounters:  03/24/24 189 lb 6 oz (85.9 kg)  02/25/24 193 lb 8 oz (87.8 kg)  01/26/24 195 lb (88.5 kg)    Physical Exam Vitals and nursing note reviewed.  Constitutional:      General: He is not in acute distress.    Appearance: Normal appearance. He is well-developed. He is not diaphoretic.     Comments: Well-appearing, discomfort from knee pain, cooperative  HENT:     Head: Normocephalic and atraumatic.  Eyes:     General:        Right eye: No discharge.        Left eye: No discharge.      Conjunctiva/sclera: Conjunctivae normal.  Cardiovascular:     Rate and Rhythm: Normal rate.  Pulmonary:     Effort: Pulmonary effort is normal.  Musculoskeletal:     Comments: Left knee with joint swelling and stiffness, limited range of motion, pain with transition  Skin:    General: Skin is warm and dry.     Findings: No erythema or rash.  Neurological:     Mental Status: He is alert and oriented to person, place, and time.  Psychiatric:        Mood and Affect: Mood normal.        Behavior: Behavior normal.        Thought Content: Thought content normal.     Comments: Well groomed, good eye contact, normal speech and thoughts     I have personally reviewed the radiology report from Left Shoulder MRI.  MR SHOULDER LEFT W CONTRAST [[id number]] Resulted: 03/17/24 1559  Order Status: Completed Updated: 03/17/24 1601  Narrative:    EXAM: MRI OF THE LEFT SHOULDER WITH CONTRAST 03/17/2024 10:56:15 AM  TECHNIQUE: Multiplanar multisequence MRI of the left shoulder was performed with the administration of intravenous contrast.  COMPARISON: None available.  CLINICAL HISTORY: Chronic left shoulder pain.  FINDINGS:  ROTATOR CUFF: Full thickness partial tear of the supraspinatus tendon shown on image 14 of series 6 with contrast medium communicating between the glenohumeral joint and the subacromial subdeltoid bursa. The tear is about 5 mm in width and about 3 mm in length. Mild supraspinatus and infraspinatus tendinopathy. Intact subscapularis and teres minor tendons. No significant muscle edema or atrophy.  BICEPS TENDON: Visualization of the interarticular segment of the long head of the biceps in the joint, with a thin long head seen in the bicipital groove on image 18 series 5. This represents at least partial tearing if not complete rupture of the long head of the biceps (assuming the patient has not had prior left biceps tenotomy or tenodesis).  LABRUM: Slightly  blunted superior labrum but no well defined labral tear observed. No paralabral cyst.  GLENOHUMERAL JOINT: Contrast medium communicating between the glenohumeral joint and the subacromial subdeltoid bursa. Mild degenerative glenohumeral arthropathy with associated chondral thinning and mild spurring of the humeral head. Normal alignment. The patient was unable to raise his arm for ABER images.  AC JOINT AND ACROMIOCLAVICULAR ARCH: Contrast medium from the bursa also communicates into  the acromioclavicular joint as on image 13 series 7. Moderate degenerative AC joint arthropathy with associated spurring and subcortical marrow edema. Perifocal (type 2) subacromial morphology. Intact acromioclavicular and coracoclavicular ligaments.  BURSA: Contrast medium communicating between the glenohumeral joint and the subacromial subdeltoid bursa. Contrast medium from the bursa also communicates into the acromioclavicular joint.  BONE MARROW: Subcortical marrow edema in the [facility] joint associated with degenerative arthropathy. No acute fracture or aggressive marrow replacing lesion.  OUTLET SPACES: Normal MRI appearance of the quadrilateral space. No significant narrowing of the supraspinatus outlet.  SOFT TISSUES: No focal abnormality of the subcutaneous soft tissues.  IMPRESSION: 1. Small full-thickness partial width supraspinatus tendon tear. 2. At least partial tear, with possible complete rupture, of the long head of the biceps tendon intraarticular segment . 3. Mild supraspinatus and infraspinatus tendinopathy. 4. Moderate degenerative acromioclavicular arthropathy with spurring and subcortical marrow edema, with contrast communication into the acromioclavicular joint. 5. Mild degenerative glenohumeral arthropathy with chondral thinning and mild humeral head spurring. 6. Slightly blunted superior labrum without a well-defined labral tear. 7. Type 2 subacromial  morphology.  Electronically signed by: Ryan Salvage MD 03/17/2024 03:59 PM EST RP Workstation: [id number]   I have personally reviewed the radiology report from 03/17/24 on L shoulder injection.  ARCOLA ALVIA ANG SHOULDER LEFT [[id number]] Resulted: 03/17/24 1522  Order Status: Completed Updated: 03/17/24 1524  Narrative:    CLINICAL DATA:  59 year old male with a history of left shoulder pain for the past year. He admits to ongoing pain particularly with forward flexion and internal rotation of the shoulder. Also admits to locking and catching of the shoulder joint. Previous steroid injection provided temporary pain relief, but patient continues to have pain and limited range of motion affecting his daily activities.  EXAM: LEFT SHOULDER INJECTION FOR MRI  PROCEDURE: After a thorough discussion of risks and benefits of the procedure, written and oral informed consent was obtained. The consent discussion included the risk of bleeding, infection and injury to nerves and adjacent blood vessels. Extra-articular injection was also a possible risk discussed.  Preliminary localization was performed over the left shoulder. The area was marked over superior medial anterior humeral head.  After prep and drape in the usual sterile fashion, the skin and deeper subcutaneous tissues were anesthetized with 1% Lidocaine  without Epinephrine . Under fluoroscopic guidance, a 22 gauge 3.5 inch spinal needle was advanced into the joint over the superior medial anterior humeral head using an anterior approach. Intra-articular injection of Lidocaine  was performed which flowed freely and subsequently a small amount of contrast was injected. Imaging revealed likely bursal injection of contrast. The needle was repositioned and an additional attempt was made to inject contrast into the joint space. Unfortunately, it was again felt that contrast was extra-articular. Dr. Davina Salvage was called  into the room for additional assistance. The needle was again repositioned and contrast was injected successfully into the joint space. The joint was distended with approximately 12 ml of a 1:200 dilution of Gadavist  contrast. The MR arthrogram solution was as follows: 15 ml of omnipaque  180 contrast agent, 0.05 mL Gadavist , 5 mL saline. An end point was felt as well as the patient experiencing pressure and the injection was discontinued, the needle removed, and a sterile dressing applied. The patient was taken to MRI for subsequent imaging.  The patient tolerated the procedure well and there were no complications.  FLUOROSCOPY: Radiation Exposure Index (as provided by the fluoroscopic device): 6.5 mGy Kerma  IMPRESSION: Successful left shoulder  fluoroscopically guided injection.  This exam was performed by [facility] PA-C and by Dr. Davina Salvage.   Electronically Signed   By: Ryan Salvage M.D.   On: 03/17/2024 15:22    Results for orders placed or performed during the hospital encounter of 01/20/24  Surgical pcr screen   Collection Time: 01/20/24 11:09 AM   Specimen: Nasal Mucosa; Nasal Swab  Result Value Ref Range   MRSA, PCR NEGATIVE NEGATIVE   Staphylococcus aureus NEGATIVE NEGATIVE  CBC WITH DIFFERENTIAL   Collection Time: 01/20/24 11:09 AM  Result Value Ref Range   WBC 7.2 4.0 - 10.5 K/uL   RBC 5.11 4.22 - 5.81 MIL/uL   Hemoglobin 15.5 13.0 - 17.0 g/dL   HCT 53.7 60.9 - 47.9 %   MCV 90.4 80.0 - 100.0 fL   MCH 30.3 26.0 - 34.0 pg   MCHC 33.5 30.0 - 36.0 g/dL   RDW 88.2 88.4 - 84.4 %   Platelets 187 150 - 400 K/uL   nRBC 0.0 0.0 - 0.2 %   Neutrophils Relative % 68 %   Neutro Abs 4.9 1.7 - 7.7 K/uL   Lymphocytes Relative 20 %   Lymphs Abs 1.5 0.7 - 4.0 K/uL   Monocytes Relative 10 %   Monocytes Absolute 0.7 0.1 - 1.0 K/uL   Eosinophils Relative 1 %   Eosinophils Absolute 0.1 0.0 - 0.5 K/uL   Basophils Relative 1 %   Basophils Absolute 0.0  0.0 - 0.1 K/uL   Immature Granulocytes 0 %   Abs Immature Granulocytes 0.02 0.00 - 0.07 K/uL  Comprehensive metabolic panel   Collection Time: 01/20/24 11:09 AM  Result Value Ref Range   Sodium 141 135 - 145 mmol/L   Potassium 4.1 3.5 - 5.1 mmol/L   Chloride 105 98 - 111 mmol/L   CO2 29 22 - 32 mmol/L   Glucose, Bld 81 70 - 99 mg/dL   BUN 18 6 - 20 mg/dL   Creatinine, Ser 8.99 0.61 - 1.24 mg/dL   Calcium 8.7 (L) 8.9 - 10.3 mg/dL   Total Protein 6.2 (L) 6.5 - 8.1 g/dL   Albumin 4.2 3.5 - 5.0 g/dL   AST 18 15 - 41 U/L   ALT 17 0 - 44 U/L   Alkaline Phosphatase 73 38 - 126 U/L   Total Bilirubin 0.6 0.0 - 1.2 mg/dL   GFR, Estimated >39 >39 mL/min   Anion gap 7 5 - 15  Urinalysis, Routine w reflex microscopic -Urine, Clean Catch   Collection Time: 01/20/24 11:09 AM  Result Value Ref Range   Color, Urine YELLOW (A) YELLOW   APPearance CLEAR (A) CLEAR   Specific Gravity, Urine 1.019 1.005 - 1.030   pH 5.0 5.0 - 8.0   Glucose, UA NEGATIVE NEGATIVE mg/dL   Hgb urine dipstick NEGATIVE NEGATIVE   Bilirubin Urine NEGATIVE NEGATIVE   Ketones, ur NEGATIVE NEGATIVE mg/dL   Protein, ur NEGATIVE NEGATIVE mg/dL   Nitrite NEGATIVE NEGATIVE   Leukocytes,Ua NEGATIVE NEGATIVE      Assessment & Plan:   Problem List Items Addressed This Visit     Chronic pain of both knees   Relevant Medications   Oxycodone  HCl 20 MG TABS   Chronic pain syndrome   Relevant Medications   Oxycodone  HCl 20 MG TABS   Chronic radicular lumbar pain   Relevant Medications   Oxycodone  HCl 20 MG TABS   Opioid dependence, uncomplicated (HCC)   Relevant Orders   DRUG MONITOR, PANEL  3, SCREEN, URINE   Other Visit Diagnoses       Postoperative pain of left knee    -  Primary   Relevant Medications   Oxycodone  HCl 20 MG TABS     Primary osteoarthritis of left knee       Relevant Medications   Oxycodone  HCl 20 MG TABS     Chronic left shoulder pain       Relevant Medications   Oxycodone  HCl 20 MG TABS      Injury of tendon of left rotator cuff, initial encounter            Postoperative pain of left knee Osteoarthritis s/p TKR L Knee, and history of TKR R Knee Persistent pain two months post-surgery, exacerbated by nerve pain and scar tissue. No swelling. Pain management complicated by opioid dependence. - Continue home and [facility] physical therapy. - Maintain moderate activity level. - Ordered two-week supply of oxycodone  20mg , quantity 35 per 7 days, will send 70 pills for 14 day supply - Ordered urine drug screen today, reviewed pain contract - He remains OFF Hydromorphone  Dilaudid  oral that was prescribed by his Orthopedics for breakthrough. - Note post op neuropathy symptoms can persist for months  Chronic pain syndrome Other joints including Lumbar Spine, Knees, Shoulder Chronic pain in knee, shoulder, and ankle. Pain management complicated by opioid dependence. Gabapentin  and Flexeril  ineffective. Avoids mixing medications. - Continue gabapentin  or Flexeril  as needed. - Research pain management specialist in [place]. We have discussed goal to revisit Pain Management when his injuries post op scenario improves. He may warrant other therapy options for chronic pain that I am limited to manage as primary care  Opioid dependence, uncomplicated Opioid dependence with previous Dilaudid  use, now managed with oxycodone . Prescribed Narcan for overdose prevention.  Left shoulder pain with rotator cuff and biceps tendon tear Pain due to supraspinatus tendon tear and possible biceps tendon rupture. MRI confirmed tear. Surgical repair may be necessary. - Follow up with orthopedic surgeon. - Avoid lifting weights.        Orders Placed This Encounter  Procedures   DRUG MONITOR, PANEL 3, SCREEN, URINE    Meds ordered this encounter  Medications   Oxycodone  HCl 20 MG TABS    Sig: Take 1 tablet (20 mg total) by mouth every 4 (four) hours as needed for up to 14 days. Max of 5 per day     Dispense:  70 tablet    Refill:  0    First fill 03/24/24. Updated quantity for 14 day supply    Follow up plan: Return in about 4 weeks (around 04/21/2024) for 4 week follow-up pain management knee/shoulder.  Marsa Officer, DO [facility] 03/24/2024, 10:15 AM     [1]  Social History Tobacco Use   Smoking status: Never    Passive exposure: Never   Smokeless tobacco: Never  Vaping Use   Vaping status: Never Used  Substance Use Topics   Alcohol use: Not Currently    Comment: occassional   Drug use: Yes    Comment: prescribed oxy 20mg  and muscle relaxers   "

## 2024-03-24 NOTE — Patient Instructions (Addendum)
 Thank you for coming to the office today.  Ordered Oxycodone  for 2 week / 14 day supply  Next fill Thurs 04/07/24  Keep in touch with progress and Ortho updates or pain management future plans.  Please schedule a Follow-up Appointment to: Return in about 4 weeks (around 04/21/2024) for 4 week follow-up pain management knee/shoulder.  If you have any other questions or concerns, please feel free to call the office or send a message through MyChart. You may also schedule an earlier appointment if necessary.  Additionally, you may be receiving a survey about your experience at our office within a few days to 1 week by e-mail or mail. We value your feedback.  Marsa Officer, DO [facility], [place]

## 2024-03-30 ENCOUNTER — Other Ambulatory Visit

## 2024-04-06 ENCOUNTER — Ambulatory Visit: Admitting: Urology

## 2024-04-22 ENCOUNTER — Ambulatory Visit: Admitting: Family Medicine

## 2024-06-15 ENCOUNTER — Ambulatory Visit: Admitting: Dermatology
